# Patient Record
Sex: Male | Born: 1980
Health system: Southern US, Community
[De-identification: ages and names within clinical notes are randomized; demographics above are authoritative.]

## PROBLEM LIST (undated history)

## (undated) DIAGNOSIS — F1010 Alcohol abuse, uncomplicated: Secondary | ICD-10-CM

## (undated) DIAGNOSIS — J302 Other seasonal allergic rhinitis: Secondary | ICD-10-CM

## (undated) DIAGNOSIS — K219 Gastro-esophageal reflux disease without esophagitis: Secondary | ICD-10-CM

## (undated) DIAGNOSIS — F419 Anxiety disorder, unspecified: Secondary | ICD-10-CM

## (undated) DIAGNOSIS — F329 Major depressive disorder, single episode, unspecified: Secondary | ICD-10-CM

## (undated) DIAGNOSIS — F1320 Sedative, hypnotic or anxiolytic dependence, uncomplicated: Secondary | ICD-10-CM

## (undated) DIAGNOSIS — I10 Essential (primary) hypertension: Secondary | ICD-10-CM

## (undated) DIAGNOSIS — E785 Hyperlipidemia, unspecified: Secondary | ICD-10-CM

## (undated) DIAGNOSIS — F1120 Opioid dependence, uncomplicated: Secondary | ICD-10-CM

## (undated) DIAGNOSIS — G44209 Tension-type headache, unspecified, not intractable: Secondary | ICD-10-CM

## (undated) DIAGNOSIS — D126 Benign neoplasm of colon, unspecified: Secondary | ICD-10-CM

## (undated) DIAGNOSIS — F32A Depression, unspecified: Secondary | ICD-10-CM

## (undated) DIAGNOSIS — F1910 Other psychoactive substance abuse, uncomplicated: Secondary | ICD-10-CM

## (undated) DIAGNOSIS — S0291XA Unspecified fracture of skull, initial encounter for closed fracture: Secondary | ICD-10-CM

## (undated) DIAGNOSIS — T7840XA Allergy, unspecified, initial encounter: Secondary | ICD-10-CM

## (undated) DIAGNOSIS — K5792 Diverticulitis of intestine, part unspecified, without perforation or abscess without bleeding: Secondary | ICD-10-CM

## (undated) DIAGNOSIS — R39198 Other difficulties with micturition: Secondary | ICD-10-CM

## (undated) HISTORY — DX: Gastro-esophageal reflux disease without esophagitis: K21.9

## (undated) HISTORY — DX: Other psychoactive substance abuse, uncomplicated: F19.10

## (undated) HISTORY — PX: WISDOM TOOTH EXTRACTION: SHX21

## (undated) HISTORY — DX: Other seasonal allergic rhinitis: J30.2

## (undated) HISTORY — DX: Anxiety disorder, unspecified: F41.9

## (undated) HISTORY — DX: Hyperlipidemia, unspecified: E78.5

## (undated) HISTORY — DX: Tension-type headache, unspecified, not intractable: G44.209

## (undated) HISTORY — DX: Allergy, unspecified, initial encounter: T78.40XA

## (undated) HISTORY — DX: Major depressive disorder, single episode, unspecified: F32.9

## (undated) HISTORY — DX: Opioid dependence, uncomplicated: F11.20

## (undated) HISTORY — DX: Diverticulitis of intestine, part unspecified, without perforation or abscess without bleeding: K57.92

## (undated) HISTORY — DX: Benign neoplasm of colon, unspecified: D12.6

## (undated) HISTORY — DX: Depression, unspecified: F32.A

## (undated) HISTORY — DX: Unspecified fracture of skull, initial encounter for closed fracture: S02.91XA

## (undated) HISTORY — DX: Essential (primary) hypertension: I10

## (undated) HISTORY — DX: Sedative, hypnotic or anxiolytic dependence, uncomplicated: F13.20

## (undated) HISTORY — DX: Alcohol abuse, uncomplicated: F10.10

---

## 2002-12-04 DIAGNOSIS — S0291XA Unspecified fracture of skull, initial encounter for closed fracture: Secondary | ICD-10-CM

## 2002-12-04 HISTORY — DX: Unspecified fracture of skull, initial encounter for closed fracture: S02.91XA

## 2003-04-05 ENCOUNTER — Inpatient Hospital Stay (HOSPITAL_COMMUNITY): Admission: AC | Admit: 2003-04-05 | Discharge: 2003-04-07 | Payer: Self-pay

## 2003-04-05 ENCOUNTER — Encounter: Payer: Self-pay | Admitting: Emergency Medicine

## 2003-04-06 ENCOUNTER — Encounter: Payer: Self-pay | Admitting: Neurosurgery

## 2006-04-19 ENCOUNTER — Emergency Department (HOSPITAL_COMMUNITY): Admission: EM | Admit: 2006-04-19 | Discharge: 2006-04-19 | Payer: Self-pay | Admitting: Emergency Medicine

## 2009-12-28 ENCOUNTER — Encounter: Payer: Self-pay | Admitting: Internal Medicine

## 2010-04-03 HISTORY — PX: VASECTOMY: SHX75

## 2010-09-02 ENCOUNTER — Encounter: Payer: Self-pay | Admitting: Internal Medicine

## 2010-09-10 ENCOUNTER — Encounter: Payer: Self-pay | Admitting: Internal Medicine

## 2010-09-21 ENCOUNTER — Encounter: Payer: Self-pay | Admitting: Internal Medicine

## 2010-09-29 ENCOUNTER — Ambulatory Visit: Payer: Self-pay | Admitting: Internal Medicine

## 2010-09-29 ENCOUNTER — Encounter: Payer: Self-pay | Admitting: Internal Medicine

## 2010-09-29 ENCOUNTER — Telehealth: Payer: Self-pay | Admitting: Internal Medicine

## 2010-09-29 DIAGNOSIS — F1021 Alcohol dependence, in remission: Secondary | ICD-10-CM

## 2010-09-29 DIAGNOSIS — G44209 Tension-type headache, unspecified, not intractable: Secondary | ICD-10-CM

## 2010-09-29 DIAGNOSIS — F341 Dysthymic disorder: Secondary | ICD-10-CM | POA: Insufficient documentation

## 2010-09-30 DIAGNOSIS — M545 Low back pain: Secondary | ICD-10-CM

## 2010-10-03 ENCOUNTER — Ambulatory Visit: Payer: Self-pay | Admitting: Internal Medicine

## 2010-10-03 LAB — CONVERTED CEMR LAB
ALT: 28 units/L (ref 0–53)
AST: 20 units/L (ref 0–37)
BUN: 17 mg/dL (ref 6–23)
Chloride: 102 meq/L (ref 96–112)
Cholesterol: 165 mg/dL (ref 0–200)
Glucose, Bld: 89 mg/dL (ref 70–99)
LDL Cholesterol: 95 mg/dL (ref 0–99)
Potassium: 4.2 meq/L (ref 3.5–5.1)
Total Bilirubin: 0.5 mg/dL (ref 0.3–1.2)
Total CHOL/HDL Ratio: 5

## 2010-10-10 ENCOUNTER — Encounter: Payer: Self-pay | Admitting: Internal Medicine

## 2010-10-20 ENCOUNTER — Ambulatory Visit: Payer: Self-pay | Admitting: Internal Medicine

## 2010-10-20 ENCOUNTER — Encounter: Payer: Self-pay | Admitting: Internal Medicine

## 2010-10-21 ENCOUNTER — Telehealth (INDEPENDENT_AMBULATORY_CARE_PROVIDER_SITE_OTHER): Payer: Self-pay | Admitting: *Deleted

## 2010-10-24 ENCOUNTER — Telehealth: Payer: Self-pay | Admitting: Internal Medicine

## 2010-10-26 ENCOUNTER — Telehealth: Payer: Self-pay | Admitting: Internal Medicine

## 2010-12-04 HISTORY — PX: OTHER SURGICAL HISTORY: SHX169

## 2010-12-22 ENCOUNTER — Encounter: Payer: Self-pay | Admitting: Internal Medicine

## 2010-12-22 ENCOUNTER — Ambulatory Visit
Admission: RE | Admit: 2010-12-22 | Discharge: 2010-12-22 | Payer: Self-pay | Source: Home / Self Care | Attending: Internal Medicine | Admitting: Internal Medicine

## 2010-12-22 DIAGNOSIS — R209 Unspecified disturbances of skin sensation: Secondary | ICD-10-CM | POA: Insufficient documentation

## 2010-12-23 ENCOUNTER — Encounter: Payer: Self-pay | Admitting: Internal Medicine

## 2010-12-27 ENCOUNTER — Encounter: Payer: Self-pay | Admitting: Internal Medicine

## 2010-12-27 ENCOUNTER — Ambulatory Visit
Admission: RE | Admit: 2010-12-27 | Discharge: 2010-12-27 | Payer: Self-pay | Source: Home / Self Care | Attending: Internal Medicine | Admitting: Internal Medicine

## 2010-12-27 ENCOUNTER — Encounter (INDEPENDENT_AMBULATORY_CARE_PROVIDER_SITE_OTHER): Payer: Self-pay | Admitting: *Deleted

## 2010-12-27 ENCOUNTER — Other Ambulatory Visit: Payer: Self-pay | Admitting: Internal Medicine

## 2010-12-27 LAB — B12 AND FOLATE PANEL: Folate: >24.8 ng/mL (ref >5.9)

## 2011-01-03 NOTE — Progress Notes (Deleted)
  Phone Note Other Incoming   Request: Send information Summary of Call: Records received from [facility] (27 Pages) forwarded to Dr. Norins.       

## 2011-01-03 NOTE — Letter (Deleted)
 [facility] [address] [place], [place]  [address] Phone: [phone]      October 13, 2010   [name] [address] [place], [place] [address]  RE:  LAB RESULTS  Dear  [name],  The following is an interpretation of your most recent lab tests.  Please take note of any instructions provided or changes to medications that have resulted from your lab work.  ELECTROLYTES:  Good - no changes needed  KIDNEY FUNCTION TESTS:  Good - no changes needed  LIVER FUNCTION TESTS:  Good - no changes needed  Health professionals look at cholesterol as more involved than just the total cholesterol. We consider the level of LDL (bad) cholesterol, HDL (good), cholesterol, and Triglycerides (Grease) in the blood.  1. Your LDL should be under 100, and the HDL should be over 45, if you have any vascular disease such as heart attack, angina, stroke, TIA (mini stroke), claudication (pain in the legs when you walk due to poor circulation),  Abdominal Aortic Aneurysm (AAA), diabetes or prediabetes.  2. Your LDL should be under 130 if you have any two of the following:     a. Smoke or chew tobacco,     b. High blood pressure (if you are on medication or over 140/90 without medication),     c. Male gender,    d. HDL below 40,    e. A male relative (father, brother, or son), who have had any vascular event          as described in #1. above under the age of 55, or a male relative (mother,       sister, or daughter) who had an event as described above under age 65. (An HDL over 60 will subtract one risk factor from the total, so if you have two items in # 2 above, but an HDL over 60, you then fall into category # 3 below).  3. Your LDL should be under 160 if you have any one of the above.  Triglycerides should be under 200 with the ideal being under 150.  For diabetes or pre-diabetes, the ideal HgbA1C should be under 6.0%.  If you fall into any of the above  categories, you should make a follow up appointment to discuss this with your physician.  LIPID PANEL:  Good - no changes needed Triglyceride: 172.0   Cholesterol: 165   LDL: 95   HDL: 35.50   Chol/HDL%:  5  DIABETIC STUDIES:  Excellent - no changes needed Blood Glucose: 89    Lab results are normal.  Please come see me if you have any questions about these lab results.   Sincerely Yours,    Michael E Norins MD Patient: [name] Note: All result statuses are Final unless otherwise noted.  Tests: (1) BMP (METABOL)   Sodium                    135 mEq/L                   135-145   Potassium                 4.2 mEq/L                   3.5-5.1   Chloride                  102 mEq/L                     96-112   Carbon Dioxide            27 mEq/L                    19-32   Glucose                   89 mg/dL                    70-99   BUN                       17 mg/dL                    6-23   Creatinine                0.9 mg/dL                   0.4-1.5   Calcium                   9.4 mg/dL                   8.4-10.5   GFR                       108.82 mL/min               >60  Tests: (2) Hepatic/Liver Function Panel (HEPATIC)   Total Bilirubin           0.5 mg/dL                   0.3-1.2   Direct Bilirubin          0.0 mg/dL                   0.0-0.3   Alkaline Phosphatase      53 U/L                      39-117   AST                       20 U/L                      0-37   ALT                       28 U/L                      0-53   Total Protein             7.2 g/dL                    6.0-8.3   Albumin                   4.3 g/dL                    3.5-5.2  Tests: (3) Lipid Panel (LIPID)   Cholesterol               165 mg/dL                   0-200     ATP III Classification              Desirable:  < 200 mg/dL                    Borderline High:  200 - 239 mg/dL               High:  > = 240 mg/dL   Triglycerides        [H]  172.0 mg/dL                 0.0-149.0     Normal:   <150 mg/dL     Borderline High:  150 - 199 mg/dL   HDL                  [L]  35.50 mg/dL                 >39.00   VLDL Cholesterol          34.4 mg/dL                  0.0-40.0   LDL Cholesterol           95 mg/dL                    0-99  CHO/HDL Ratio:  CHD Risk                             5                    Men          Women     1/2 Average Risk     3.4          3.3     Average Risk          5.0          4.4     2X Average Risk          9.6          7.1     3X Average Risk          15.0          11.0 

## 2011-01-03 NOTE — Assessment & Plan Note (Deleted)
Summary: PER MD--ALMOST 1 MTH FU---STC   Vital Signs:  Patient profile:   29 year old male Height:      74 inches Weight:      168 pounds BMI:     21.65 O2 Sat:      97 % on Room air Temp:     98.7 degrees F oral Pulse rate:   71 / minute BP sitting:   110 / 68  (left arm) Cuff size:   regular  Vitals Entered By: Ami Bullins CMA (October 20, 2010 3:32 PM)  O2 Flow:  Room air CC: follow-up visit/ ab   Primary Care Provider:  Michael E Norins MD  CC:  follow-up visit/ ab.  History of Present Illness: Patient returns for follow-up. In the interval he has had a chance to review the original note without corrections or additions.  He did have ESI per Dr. HIlts which was not successful. He did not get relief from meloxicam. He did have vicodin prescribed. He continues with physical therapy usually twice a week.   He reports that his depression and anxiety are worse. He did try fluoxetine but has akesthesia. He did see Dr. Dekle 11/16 but no new medications were prescribed. Per patient Dr. Dekle did not feel there was a need for hospitalization. He does have suicidal ideation but does not have a plan or materials on hand. He has a follow-up appointment next wednsday.   Current Medications (verified): 1)  Alprazolam 1 Mg Tabs (Alprazolam) .... 1 Tablet Four Times A Day 2)  Norco 7.5-325 Mg Tabs (Hydrocodone-Acetaminophen) .... 1 By Mouth Three Times A Day Prn 3)  Meloxicam 15 Mg Tabs (Meloxicam) .... 1 By Mouth Once Daily 4)  Ranitidine Hcl 150 Mg Caps (Ranitidine Hcl) .... 1 By Mouth Two Times A Day  Allergies (verified): 1)  ! * Ssri 2)  ! * Snri's 3)  ! * Avelox PMH-FH-SH reviewed-no changes except otherwise noted  Review of Systems       The patient complains of headaches and depression.  The patient denies anorexia, fever, weight loss, hoarseness, chest pain, dyspnea on exertion, abdominal pain, and muscle weakness.    Physical Exam  General:  Thin white male in no  medical distress Head:  Normocephalic and atraumatic without obvious abnormalities. No apparent alopecia or balding. Eyes:  vision grossly intact, pupils equal, and pupils round.  C&S clear Lungs:  normal respiratory effort.   Heart:  normal rate and regular rhythm.   Neurologic:  alert & oriented X3, cranial nerves II-XII intact, and gait normal.   Psych:  dysphoric affect, depressed affect, withdrawn, poor eye contact, and slightly anxious.     Impression & Recommendations:  Problem # 1:  BACK PAIN, LUMBAR, CHRONIC (ICD-724.2) He will need to contiue with ortho and PT. He is taking norco without side effects.  His updated medication list for this problem includes:    Norco 7.5-325 Mg Tabs (Hydrocodone-acetaminophen) ..... 1 by mouth three times a day prn    Meloxicam 15 Mg Tabs (Meloxicam) ..... 1 by mouth once daily  Problem # 2:  ANXIETY DEPRESSION (ICD-300.4) Seems much worse. He does have close follow -up with psychiatry.  Complete Medication List: 1)  Alprazolam 1 Mg Tabs (Alprazolam) .... 1 tablet four times a day 2)  Norco 7.5-325 Mg Tabs (Hydrocodone-acetaminophen) .... 1 by mouth three times a day prn 3)  Meloxicam 15 Mg Tabs (Meloxicam) .... 1 by mouth once daily 4)  Ranitidine Hcl   150 Mg Caps (Ranitidine hcl) .... 1 by mouth two times a day   Orders Added: 1)  Est. Patient Level III [99213] 

## 2011-01-03 NOTE — Progress Notes (Deleted)
Summary: back pain- RF HYDROCODONE  Phone Note Call from Patient Call back at Home Phone [phone]   Caller: Spouse-Karen [phone] Summary of Call: Patient wife called lmovm stating that he is having very bad back pain. She is requesting a return call to her or him   Follow-up for Phone Call        Spoke w/pt's wife, pt missed some PT apt's but has rescheduled. He is req refill of hydrocodone, OK?  Also advised he be persistent w/therapy and come in for re-eval if pain is different or worse - Wife agreed. Follow-up by: Sarah Douglas, CMA,  October 24, 2010 12:44 PM  Additional Follow-up for Phone Call Additional follow up Details #1::        OK to refill Norco with 1 add'l Additional Follow-up by: Michael E Norins MD,  October 24, 2010 5:19 PM    Prescriptions: NORCO 7.5-325 MG TABS (HYDROCODONE-ACETAMINOPHEN) 1 by mouth three times a day prn  #90 x 1   Entered by:   Sarah Douglas, CMA   Authorized by:   Michael E Norins MD   Signed by:   Sarah Douglas, CMA on 10/24/2010   Method used:   Telephoned to ...       [facility] [address] # [id number]* (retail)       [address]       [place], [place]  [address]       Ph: [phone]       Fax: [phone]   RxID:   [id number]  

## 2011-01-03 NOTE — Progress Notes (Deleted)
Summary: Pain med?  Phone Note Call from Patient Call back at Home Phone [phone]   Summary of Call: Pt's wife called and said pt was advised to call office if he needed more pain medication? Pt would like refill of pain medication Initial call taken by: Sarah Douglas, CMA,  September 29, 2010 5:11 PM  Follow-up for Phone Call        called patinet. Will call in Rx.     

## 2011-01-03 NOTE — Assessment & Plan Note (Deleted)
Summary: NEW [employer] PT--#--PKG--STC   Vital Signs:  Patient profile:   30 year old male Height:      74 inches Weight:      171 pounds BMI:     22.03 O2 Sat:      97 % on Room air Temp:     98.2 degrees F oral Pulse rate:   74 / minute BP sitting:   122 / 72  (left arm) Cuff size:   regular  Vitals Entered By: Ami Bullins CMA (September 29, 2010 1:26 PM)  O2 Flow:  Room air CC: new pt here to est care with primary MD/ ab   Primary Care Provider:  Michael E Norins MD  CC:  new pt here to est care with primary MD/ ab.  History of Present Illness: Present presents to establish for on-going primary care.  Has problem with low back pain: sponylolisthesis L5-S1, bulging disk at L5-S1. Studies ordered Dr. Dean/Hilts. He was last seen last week. For steroid injection today by Dr. Hilts. He is currently doing PT with [facility]. Has not responded to muscle relaxants. Currently on Hydorcodone/APAP 7.5/325. Has failed NSAIDs and has had dyspepsia.   He has a pilonidal cyst - which has been intermittently treated. He has not seen a surgeon.  Sees Dr. Diekel for anxiety management.   Preventive Screening-Counseling & Management  Alcohol-Tobacco     Alcohol drinks/day: 0     Smoking Status: quit     Year Quit: 2009  Caffeine-Diet-Exercise     Caffeine use/day: 3 cups per day     Diet Comments: healthy diet kind of     Does Patient Exercise: no  Hep-HIV-STD-Contraception     Hepatitis Risk: no risk noted     HIV Risk: no risk noted     STD Risk: no risk noted     Dental Visit-last 6 months yes     Sun Exposure-Excessive: no  Safety-Violence-Falls     Seat Belt Use: yes     Helmet Use: yes     Firearms in the Home: no firearms in the home     Smoke Detectors: yes     Violence in the Home: no risk noted     Sexual Abuse: no     Fall Risk: low fall risk      Sexual History:  currently monogamous.        Drug Use:  former.        Blood Transfusions:  no.    Current  Medications (verified): 1)  Alprazolam 1 Mg Tabs (Alprazolam) .... 1 Tablet Four Times A Day 2)  Norco 7.5-325 Mg Tabs (Hydrocodone-Acetaminophen) .... 1 By Mouth Three Times A Day Prn 3)  Meloxicam 15 Mg Tabs (Meloxicam) .... 1 By Mouth Once Daily 4)  Ranitidine Hcl 150 Mg Caps (Ranitidine Hcl) .... 1 By Mouth Two Times A Day  Allergies (verified): 1)  ! * Ssri 2)  ! * Snri's 3)  ! * Avelox  Past History:  Past Medical History: HEADACHE, TENSION (ICD-307.81) ANXIETY DEPRESSION (ICD-300.4) ALCOHOL ABUSE, HX OF (ICD-V11.3)  Past Surgical History: Vasectomy May '11 Trauma - skull fracture '04  Family History: Father- 1955 - HTN, Lipids Mother -deceased 55 - EtOH/Drug related disease, depression/anxiety Neg- colon or prostate cancer; CAD; DM Positive for depression/anxiety; alcohol abuse.  Social History: [employer], attending [employer] ( oct o'11) Married - June '11 No children work - full time student No history of physical or sexual abuseSmoking Status:    quit Caffeine use/day:  3 cups per day Sun Exposure-Excessive:  no Seat Belt Use:  yes Fall Risk:  low fall risk Does Patient Exercise:  no Hepatitis Risk:  no risk noted HIV Risk:  no risk noted STD Risk:  no risk noted Dental Care w/in 6 mos.:  yes Sexual History:  currently monogamous Drug Use:  former Blood Transfusions:  no  Review of Systems       The patient complains of anorexia, abdominal pain, severe indigestion/heartburn, and difficulty walking.  The patient denies fever, weight loss, weight gain, vision loss, decreased hearing, hoarseness, chest pain, syncope, dyspnea on exertion, prolonged cough, hemoptysis, melena, hematochezia, hematuria, muscle weakness, transient blindness, depression, enlarged lymph nodes, angioedema, and testicular masses.         NSAIDs, hemetemesis - mild. Due to back pain - leg weakness  Physical Exam  General:  Slender white male in no distress Head:  normocephalic and atraumatic.     Eyes:  vision grossly intact, pupils equal, pupils round, and corneas and lenses clear.   Ears:  External ear exam shows no significant lesions or deformities.  Otoscopic examination reveals clear canals, tympanic membranes are intact bilaterally without bulging, retraction, inflammation or discharge. Hearing is grossly normal bilaterally. Nose:  no external deformity and no external erythema.   Mouth:  Oral mucosa and oropharynx without lesions or exudates.  Teeth in good repair. Neck:  supple, full ROM, no thyromegaly, and no carotid bruits.   Chest Wall:  no deformities.   Lungs:  normal respiratory effort, normal breath sounds, and no wheezes.   Heart:  normal rate, regular rhythm, no murmur, and no JVD.   Abdomen:  soft, non-tender, and normal bowel sounds.   Msk:  normal ROM and no joint tenderness.  Back - able to stand without assist, able to step-up to exam table without assist, nl SLR sitting Pulses:  2+ radial Extremities:  No clubbing, cyanosis, edema, or deformity noted with normal full range of motion of all joints.   Neurologic:  alert & oriented X3, cranial nerves II-XII intact, gait normal, and DTRs symmetrical and normal.   Skin:  turgor normal, color normal, and no rashes.   Cervical Nodes:  no anterior cervical adenopathy and no posterior cervical adenopathy.   Psych:  Oriented X3, normally interactive, good eye contact, and not anxious appearing.     Impression & Recommendations:  Problem # 1:  BACK PAIN, LUMBAR, CHRONIC (ICD-724.2) Per patient report spondylolisthesis L5-S1 with associated disk bulge. He has been followed by Dr. Dean and Dr. Hilts. He has been prescribed hydorcodone for pain. He is participating in PT. He is scheduled for ESI today.  Discussed the use of controlled substances. Reviewed our policy: single prescriber, single pharmacy, no after hours or weekend calls for refills, random drug screens at doctor's descretion.  Patient advised that PT and  use of NSAIDs are best option. He has had a problem with non-selective NSAIDs in the past with stomach irritation. Suggested that COX 2 selective agents along with H2 blocker reduction of gastric acidity may be a solution.  His updated medication list for this problem includes:    Norco 7.5-325 Mg Tabs (Hydrocodone-acetaminophen) ..... 1 by mouth three times a day prn    Meloxicam 15 Mg Tabs (Meloxicam) ..... 1 by mouth once daily  Addendum - patient called this PM with request for medication. 30 Day supply of hydorcodone called in along with meloxicam and ranitidine.  Problem # 2:    HEADACHE, TENSION (ICD-307.81) Intermittent problem which he associates with his back pain.  His updated medication list for this problem includes:    Norco 7.5-325 Mg Tabs (Hydrocodone-acetaminophen) ..... 1 by mouth three times a day prn    Meloxicam 15 Mg Tabs (Meloxicam) ..... 1 by mouth once daily  Problem # 3:  ANXIETY DEPRESSION (ICD-300.4) Chronic problem along with a positive family history. He is under the care of a psychiatrist.  Problem # 4:  ALCOHOL ABUSE, HX OF (ICD-V11.3) clean and sober for 2 years. Discussed long term management and the high risk of recidivism. Stongly supported AA should he have any issues with maintaining his recovery.  Problem # 5:  Preventive Health Care (ICD-V70.0) Limited physical exam is unremarkable. He is to return for routine lab: glucose, lipids, renal function and liver functions. He will need a follow-up visit in 4 weeks.   Complete Medication List: 1)  Alprazolam 1 Mg Tabs (Alprazolam) .... 1 tablet four times a day 2)  Norco 7.5-325 Mg Tabs (Hydrocodone-acetaminophen) .... 1 by mouth three times a day prn 3)  Meloxicam 15 Mg Tabs (Meloxicam) .... 1 by mouth once daily 4)  Ranitidine Hcl 150 Mg Caps (Ranitidine hcl) .... 1 by mouth two times a day Prescriptions: RANITIDINE HCL 150 MG CAPS (RANITIDINE HCL) 1 by mouth two times a day  #60 x 12   Entered and  Authorized by:   Michael E Norins MD   Signed by:   Michael E Norins MD on 09/29/2010   Method used:   Telephoned to ...       [facility] # [id number]* (retail)       [address]       [place], [place]  [address]       Ph: [phone]       Fax: [phone]   RxID:   [id number] MELOXICAM 15 MG TABS (MELOXICAM) 1 by mouth once daily  #30 x 12   Entered and Authorized by:   Michael E Norins MD   Signed by:   Michael E Norins MD on 09/29/2010   Method used:   Telephoned to ...         RxID:   [id number] NORCO 7.5-325 MG TABS (HYDROCODONE-ACETAMINOPHEN) 1 by mouth three times a day prn  #90 x 0   Entered and Authorized by:   Michael E Norins MD   Signed by:   Michael E Norins MD on 09/29/2010   Method used:   Telephoned to ...         RxID:   [id number]    Orders Added: 1)  New Patient Level IV [99204]   Immunization History:  Tetanus/Td Immunization History:    Tetanus/Td:  historical (02/05/2003)   Immunization History:  Tetanus/Td Immunization History:    Tetanus/Td:  Historical (02/05/2003)  

## 2011-01-03 NOTE — Letter (Deleted)
Summary: [facility]  [facility]   Imported By: Iris Hopkins 11/11/2010 12:20:58  _____________________________________________________________________  External Attachment:    Type:   Image     Comment:   External Document  

## 2011-01-03 NOTE — Progress Notes (Deleted)
Summary: Hydrocodone  Phone Note Call from Patient Call back at Home Phone [phone]   Summary of Call: Pt states that at last office visit MD advised pt to take hydrocodone four times a day. Patient is requesting rx to reflect this.  Initial call taken by: Sarah Douglas, CMA,  October 26, 2010 3:54 PM  Follow-up for Phone Call        Hmmmmm! My note doesn't reflect that but I may have said he could take it every 6 hours. Change med list to reflect q6 dosing = #120, 2 refills. Thanks Follow-up by: Mcguire Gasparyan E Dicy Smigel MD,  October 26, 2010 5:22 PM  Additional Follow-up for Phone Call Additional follow up Details #1::        pt & pharm informed. Canceled previous rx's refill of qty # and called in new Additional Follow-up by: Sarah Douglas, CMA,  October 26, 2010 6:05 PM    New/Updated Medications: NORCO 7.5-325 MG TABS (HYDROCODONE-ACETAMINOPHEN) 1 four times a day as needed pain Prescriptions: NORCO 7.5-325 MG TABS (HYDROCODONE-ACETAMINOPHEN) 1 four times a day as needed pain  #120 x 2   Entered by:   Sarah Douglas, CMA   Authorized by:   Massie Cogliano E Niko Penson MD   Signed by:   Sarah Douglas, CMA on 10/26/2010   Method used:   Telephoned to ...       [facility] # [id number]* (retail)       [address]       [place],   [address]       Ph: [phone]       Fax: [phone]   RxID:   [id number]  

## 2011-01-05 NOTE — Miscellaneous (Deleted)
Summary: Orders Update  Clinical Lists Changes  Orders: Added new Referral order of Pain Clinic Referral (Pain) - Signed 

## 2011-01-05 NOTE — Assessment & Plan Note (Deleted)
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  He also is asking if it would be oK with me for him to see a NP working with Claudia Desanctis, MD to establish for local psychiatric care - which of course if fine.   Current Medications (verified): 1)  Alprazolam 1 Mg Tabs (Alprazolam) .Marland Kitchen.. 1 Tablet Four Times A Day 2)  Norco 7.5-325 Mg Tabs (Hydrocodone-Acetaminophen) .Marland Kitchen.. 1 Four Times A Day As Needed Pain 3)  Meloxicam 15 Mg Tabs (Meloxicam) .Marland Kitchen.. 1 By Mouth Once Daily 4)  Ranitidine Hcl 150 Mg Caps (Ranitidine Hcl) .Marland Kitchen.. 1 By Mouth Two Times A Day  Allergies (verified): 1)  ! * Ssri 2)  ! * Snri's 3)  ! * Avelox PMH-FH-SH reviewed-no changes except otherwise noted   Complete Medication List: 1)  Alprazolam 1 Mg Tabs (Alprazolam) .Marland Kitchen.. 1 tablet four times a day 2)  Norco 7.5-325 Mg Tabs (Hydrocodone-acetaminophen) .Marland Kitchen.. 1 four times a day as needed pain 3)  Meloxicam 15 Mg Tabs (Meloxicam) .Marland Kitchen.. 1 by mouth once daily 4)  Ranitidine Hcl 150 Mg Caps (Ranitidine hcl) .Marland Kitchen.. 1 by mouth two times a day   Orders Added: 1)  Est. Patient Level I [74259]

## 2011-01-05 NOTE — Letter (Deleted)
Summary: Generic Letter  [facility]  [address] [place], [place] [address]   Phone: [phone]  Fax: [phone]          12/27/2010  Re: [name]     DOB Sep 25, 1981     MRN # [id number]  To Whom It May Concern:  [name] is follow in out practice for chronic back pain, chronic headache and anxiety/depression that has been managed by psychiatry.   Over the past several months his condidtion has deteriorated with increased problems with back pain and headache such that he cannot sit for prolonged periods of time and has increased difficulty with concentration due to the pain, pain meds and headache.  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 His medical problems have significantly interfered with his ability to continue his studies.   Thank you for your consideration.   Sincerely,   Illene Regulus MD

## 2011-01-05 NOTE — Assessment & Plan Note (Deleted)
Summary: discuss ortho/lb   Vital Signs:  Patient profile:   30 year old male Height:      74 inches Weight:      171 pounds BMI:     22.03 O2 Sat:      98 % on Room air Temp:     98.6 degrees F oral Pulse rate:   77 / minute BP sitting:   116 / 82  (left arm) Cuff size:   regular  Vitals Entered By: [name] CMA (December 22, 2010 11:12 AM)  O2 Flow:  Room air  Primary Care Provider:  Jacques Navy MD   History of Present Illness: [name] presents today for progressive pain in the back and now with loss of feeling left heel, lateral aspect over the past month. He does have pain that radiates down to the knee. This numbness does not interfere with walking or balance.   He also reports that he looses grip strength either in one or both hands. Usually of limited duration and normal strength returns. This has been a problem for many years.   Current Medications (verified): 1)  Alprazolam 1 Mg Tabs (Alprazolam) .Marland Kitchen.. 1 Tablet Four Times A Day 2)  Norco 7.5-325 Mg Tabs (Hydrocodone-Acetaminophen) .Marland Kitchen.. 1 Four Times A Day As Needed Pain 3)  Meloxicam 15 Mg Tabs (Meloxicam) .Marland Kitchen.. 1 By Mouth Once Daily 4)  Ranitidine Hcl 150 Mg Caps (Ranitidine Hcl) .Marland Kitchen.. 1 By Mouth Two Times A Day  Allergies (verified): 1)  ! * Ssri 2)  ! * Snri's 3)  ! * Avelox  Past History:  Past Medical History: Last updated: 09/29/2010 HEADACHE, TENSION (ICD-307.81) ANXIETY DEPRESSION (ICD-300.4) ALCOHOL ABUSE, HX OF (ICD-V11.3)  Past Surgical History: Last updated: 09/29/2010 Vasectomy May '11 Trauma - skull fracture '04  Family History: Last updated: 09/29/2010 Father- 1955 - HTN, Lipids Mother -deceased 5 - EtOH/Drug related disease, depression/anxiety Neg- colon or prostate cancer; CAD; DM Positive for depression/anxiety; alcohol abuse.  Social History: Last updated: 09/29/2010 HS[employer], attending [employer] ( oct o'11) Married - June '11 No children work - full Neurosurgeon No history of  physical or sexual abuse  Review of Systems  The patient denies anorexia, weight loss, weight gain, chest pain, dyspnea on exertion, headaches, abdominal pain, hematochezia, hematuria, muscle weakness, suspicious skin lesions, depression, abnormal bleeding, and angioedema.    Physical Exam  General:  slender and tall white male in no distress Head:  normocephalic and atraumatic.   Eyes:  C&S clear Lungs:  normal respiratory effort.   Heart:  normal rate and regular rhythm.   Msk:  back exam: nl flex to 180 but difficulty standing back up straight. Nl gait, toe and heel walk altough he complained of discomfort iwth toe walk. Able to step up to exam. DTRs 2+ at patellar tendon. Nl SLR sitting. Sensation normal to light touch and pin prick left LE except for lateral aspect of heel.  Nl deep vibratory sensation. Pulses:  2+ radial Neurologic:  alert & oriented X3.   Skin:  turgor normal and color normal.   Psych:  Oriented X3, normally interactive, good eye contact, and not anxious appearing.  Appears to have a flat affect.   Impression & Recommendations:  Problem # 1:  PARESTHESIA (ICD-782.0) Pateint with paresthesia left heel. Exam with no radicular findings. Concern for metabolic cause of symptoms.  Plan - r/o  B12 or thyroid deficiency.  Orders: TLB-B12 + Folate Pnl ([id number]-U98/JXB) TLB-TSH (Thyroid Stimulating Hormone) (84443-TSH)  Problem # 2:  BACK PAIN, LUMBAR, CHRONIC (ICD-724.2) Patient asking about stronger pain medication. Advised that alternatives to narcotic pain medication is worth pursuing.  Plan - no change in medication           referral for pain management   His updated medication list for this problem includes:    Norco 7.5-325 Mg Tabs (Hydrocodone-acetaminophen) .Marland Kitchen... 1 four times a day as needed pain    Meloxicam 15 Mg Tabs (Meloxicam) .Marland Kitchen... 1 by mouth once daily  Problem # 3:  ANXIETY DEPRESSION (ICD-300.4) Dr. Duayne Cal is changing practice and [name] wishes at this juncture to move his care to [facility].  He is provided names and tele #'s for Drs. Nolen Mu, Plovsky and Cucumber.  Complete Medication List: 1)  Alprazolam 1 Mg Tabs (Alprazolam) .Marland Kitchen.. 1 tablet four times a day 2)  Norco 7.5-325 Mg Tabs (Hydrocodone-acetaminophen) .Marland Kitchen.. 1 four times a day as needed pain 3)  Meloxicam 15 Mg Tabs (Meloxicam) .Marland Kitchen.. 1 by mouth once daily 4)  Ranitidine Hcl 150 Mg Caps (Ranitidine hcl) .Marland Kitchen.. 1 by mouth two times a day   Orders Added: 1)  TLB-B12 + Folate Pnl [[id number]/FOL] 2)  TLB-TSH (Thyroid Stimulating Hormone) [84443-TSH] 3)  Est. Patient Level III [65784]

## 2011-01-05 NOTE — Letter (Deleted)
Summary: Primary Care Consult Scheduled Letter  [facility]  [address] [place], [place] [address]   Phone: [phone]  Fax: [phone]      12/27/2010 MRN: [id number]  [facility] [address] [place], [place]  [address]    Dear Mr. Laminack,      We have scheduled an appointment for you.  At the recommendation of Dr.Norins we have scheduled you a consult with [facility] (Dr. Wynn Banker) on 02-06-2011 at 10:45 am . Their address is [address], [address], [place], [place] [address]. The office phone number is [phone].  If this appointment day and time is not convenient for you, please feel free to call the office of the doctor you are being referred to at the number listed above and reschedule the appointment.     It is important for you to keep your scheduled appointments. We are here to make sure you are given good patient care. If you have questions or you have made changes to your appointment, please notify us at  [phone]        , ask for   Debra           .     Thank you,  Patient Care Coordinator [facility]

## 2011-01-05 NOTE — Letter (Deleted)
Summary: Date Range: 08-24-10 to 09-21-10/[facility]  Date Range: 08-24-10 to 09-21-10/[facility]   Imported By: Dorothy Jones 12/15/2010 14:48:31  _____________________________________________________________________  External Attachment:    Type:   Image     Comment:   External Document  

## 2011-01-19 ENCOUNTER — Encounter: Payer: Self-pay | Admitting: Internal Medicine

## 2011-01-19 ENCOUNTER — Ambulatory Visit: Payer: Self-pay | Admitting: Internal Medicine

## 2011-01-19 ENCOUNTER — Ambulatory Visit (INDEPENDENT_AMBULATORY_CARE_PROVIDER_SITE_OTHER): Payer: BC Managed Care – PPO | Admitting: Internal Medicine

## 2011-01-19 DIAGNOSIS — F341 Dysthymic disorder: Secondary | ICD-10-CM

## 2011-01-19 DIAGNOSIS — M545 Low back pain: Secondary | ICD-10-CM

## 2011-01-23 ENCOUNTER — Telehealth: Payer: Self-pay | Admitting: Internal Medicine

## 2011-01-23 DIAGNOSIS — L0591 Pilonidal cyst without abscess: Secondary | ICD-10-CM | POA: Insufficient documentation

## 2011-01-25 NOTE — Assessment & Plan Note (Deleted)
Summary: follow up, discuss recent labs   Vital Signs:  Patient profile:   30 year old male Height:      74 inches Weight:      172 pounds BMI:     22.16 O2 Sat:      97 % on Room air Temp:     97.9 degrees F oral Pulse rate:   88 / minute Resp:     16 per minute BP sitting:   116 / 62  (left arm)  Vitals Entered By: Burnard Leigh CMA(AAMA) (January 19, 2011 11:38 AM)  O2 Flow:  Room air CC: Pt here to discuss lab results & MRI.Marland KitchenPt also c/o of pain in back to be assessed/sls,cma Is Patient Diabetic? No   Primary Care Provider:  Jacques Navy MD  CC:  Pt here to discuss lab results & MRI.Marland KitchenPt also c/o of pain in back to be assessed/sls and cma.  History of Present Illness: [name] has seen Johnathan Poles, NP with [name]. He did not do well with lamictal and then the office would not refill his xanax. He does not wish to return to them. He is planning to return  to  [name] old office. He will need refill of alprazolam. If he cannot get an appoiintment I will provide interim prescriptions but I made it clear that psych should be managing his medications.  He has stopped Physical therapy. He has been referred by his ortho to a chiropractor. He does have an appt with [name] March 5th - encouraged to keep this appointment.   Current Medications (verified): 1)  Alprazolam 1 Mg Tabs (Alprazolam) .Marland Kitchen.. 1 Tablet Four Times A Day 2)  Norco 7.5-325 Mg Tabs (Hydrocodone-Acetaminophen) .Marland Kitchen.. 1 Four Times A Day As Needed Pain 3)  Meloxicam 15 Mg Tabs (Meloxicam) .Marland Kitchen.. 1 By Mouth Once Daily  Allergies (verified): 1)  ! * Ssri 2)  ! * Snri's 3)  ! * Avelox  Past History:  Past Medical History: Last updated: 09/29/2010 HEADACHE, TENSION (ICD-307.81) ANXIETY DEPRESSION (ICD-300.4) ALCOHOL ABUSE, HX OF (ICD-V11.3)  Past Surgical History: Last updated: 09/29/2010 Vasectomy May '11 Trauma - skull fracture '04 FH reviewed for relevance, SH/Risk Factors reviewed  for relevance  Review of Systems  The patient denies anorexia, weight loss, weight gain, syncope, headaches, abdominal pain, muscle weakness, difficulty walking, and enlarged lymph nodes.    Physical Exam  General:  slender white man in no distress Head:  normocephalic and atraumatic.   Eyes:  C&S clear Neck:  supple.   Lungs:  normal respiratory effort.   Heart:  normal rate and regular rhythm.   Msk:  normal ROM.   Neurologic:  alert & oriented X3 and gait normal.   Skin:  turgor normal and color normal.   Psych:  Oriented X3, memory intact for recent and remote, normally interactive, and good eye contact.     Impression & Recommendations:  Problem # 1:  BACK PAIN, LUMBAR, CHRONIC (ICD-724.2) He will keep his appointment with [name] at [facility]. OK to also see chiropractor should he so choose. Encouraged him to use norco sparingly - no refill today  His updated medication list for this problem includes:    Norco 7.5-325 Mg Tabs (Hydrocodone-acetaminophen) .Marland Kitchen... 1 four times a day as needed pain    Meloxicam 15 Mg Tabs (Meloxicam) .Marland Kitchen... 1 by mouth once daily  Problem # 2:  ANXIETY DEPRESSION (ICD-300.4) Appears to be stable at today's visit. Plan is  outline in HPI.  Complete Medication List: 1)  Alprazolam 1 Mg Tabs (Alprazolam) .Marland Kitchen.. 1 tablet four times a day 2)  Norco 7.5-325 Mg Tabs (Hydrocodone-acetaminophen) .Marland Kitchen.. 1 four times a day as needed pain 3)  Meloxicam 15 Mg Tabs (Meloxicam) .Marland Kitchen.. 1 by mouth once daily   Orders Added: 1)  Est. Patient Level III [16109]

## 2011-01-31 ENCOUNTER — Encounter: Payer: Self-pay | Admitting: Internal Medicine

## 2011-01-31 NOTE — Progress Notes (Deleted)
Summary: RF  Phone Note Call from Patient   Caller: Wife Stark Klein [phone]  Summary of Call: Patient is requesting refill of alprazolam 1mg  1 four times a day #150. Previously written by Dr Duayne Cal. He is req rx asap - wife says pt is having panic attack. Initial call taken by: Lamar Sprinkles, CMA,  January 23, 2011 2:04 PM  Follow-up for Phone Call        OK one per MD - Pt informed - He has apt w/Dr [facility] in april.   ALSO - wife left vm, pt's cyst is acting up? Wants referral to surgeon.  Follow-up by: Lamar Sprinkles, CMA,  January 23, 2011 2:08 PM  Additional Follow-up for Phone Call Additional follow up Details #1::        OK for referral for pilonidal cyst. [facility] notified Additional Follow-up by: Jacques Navy MD,  January 23, 2011 5:33 PM  New Problems: PILONIDAL CYST (ICD-685.1)   New Problems: PILONIDAL CYST (ICD-685.1) Prescriptions: ALPRAZOLAM 1 MG TABS (ALPRAZOLAM) 1 tablet four times a day  #150 x 0   Entered by:   Lamar Sprinkles, CMA   Authorized by:   Jacques Navy MD   Signed by:   Lamar Sprinkles, CMA on 01/23/2011   Method used:   Telephoned to ...       [facility] # [phone]* (retail)       [address]       [place], [place]  [address]       Ph: [phone]       Fax: [phone]   RxID:   [id number]

## 2011-02-06 ENCOUNTER — Encounter: Payer: BC Managed Care – PPO | Attending: Physical Medicine & Rehabilitation

## 2011-02-06 ENCOUNTER — Ambulatory Visit (HOSPITAL_BASED_OUTPATIENT_CLINIC_OR_DEPARTMENT_OTHER): Payer: BC Managed Care – PPO | Admitting: Physical Medicine & Rehabilitation

## 2011-02-06 DIAGNOSIS — M79609 Pain in unspecified limb: Secondary | ICD-10-CM

## 2011-02-06 DIAGNOSIS — M545 Low back pain, unspecified: Secondary | ICD-10-CM | POA: Insufficient documentation

## 2011-02-06 DIAGNOSIS — R209 Unspecified disturbances of skin sensation: Secondary | ICD-10-CM

## 2011-02-06 DIAGNOSIS — Q762 Congenital spondylolisthesis: Secondary | ICD-10-CM | POA: Insufficient documentation

## 2011-02-09 ENCOUNTER — Telehealth: Payer: Self-pay | Admitting: Internal Medicine

## 2011-02-10 ENCOUNTER — Ambulatory Visit (HOSPITAL_COMMUNITY)
Admission: RE | Admit: 2011-02-10 | Discharge: 2011-02-10 | Disposition: A | Discharge status: Home / Self Care | Payer: BC Managed Care – PPO | Source: Ambulatory Visit | Attending: Physical Medicine & Rehabilitation | Admitting: Physical Medicine & Rehabilitation

## 2011-02-10 ENCOUNTER — Other Ambulatory Visit: Payer: Self-pay | Admitting: Physical Medicine & Rehabilitation

## 2011-02-10 DIAGNOSIS — M549 Dorsalgia, unspecified: Secondary | ICD-10-CM | POA: Insufficient documentation

## 2011-02-10 DIAGNOSIS — R52 Pain, unspecified: Secondary | ICD-10-CM

## 2011-02-10 DIAGNOSIS — Q762 Congenital spondylolisthesis: Secondary | ICD-10-CM | POA: Insufficient documentation

## 2011-02-14 NOTE — Progress Notes (Signed)
Summary: RF  Phone Note Refill Request Message from:  Pharmacy  Refills Requested: Medication #1:  NORCO 7.5-325 MG TABS 1 four times a day as needed pain [facility]  Initial call taken by: Lamar Sprinkles, CMA,  February 09, 2011 3:55 PM  Follow-up for Phone Call        ok for refil x 2 Follow-up by: Jacques Navy MD,  February 09, 2011 6:26 PM    Prescriptions: NORCO 7.5-325 MG TABS (HYDROCODONE-ACETAMINOPHEN) 1 four times a day as needed pain  #120 x 2   Entered by:   Bill Salinas CMA   Authorized by:   Jacques Navy MD   Signed by:   Bill Salinas CMA on 02/10/2011   Method used:   Telephoned to ...       [facility] # [phone]* (retail)       [address]       [place], [place]  [address]       Ph: [phone]       Fax: [phone]   RxID:   [id number]

## 2011-02-14 NOTE — Consult Note (Signed)
Summary: [facility]   Imported By: Sherian Rein 02/10/2011 13:59:00  _____________________________________________________________________  External Attachment:    Type:   Image     Comment:   External Document

## 2011-02-23 ENCOUNTER — Ambulatory Visit (HOSPITAL_BASED_OUTPATIENT_CLINIC_OR_DEPARTMENT_OTHER): Payer: BC Managed Care – PPO | Admitting: Physical Medicine & Rehabilitation

## 2011-02-23 ENCOUNTER — Ambulatory Visit (INDEPENDENT_AMBULATORY_CARE_PROVIDER_SITE_OTHER): Payer: BC Managed Care – PPO | Admitting: Internal Medicine

## 2011-02-23 VITALS — BP 120/84 | HR 87 | Temp 98.6°F | Ht 74.0 in | Wt 174.0 lb

## 2011-02-23 DIAGNOSIS — J90 Pleural effusion, not elsewhere classified: Secondary | ICD-10-CM

## 2011-02-23 DIAGNOSIS — Q762 Congenital spondylolisthesis: Secondary | ICD-10-CM

## 2011-02-23 MED ORDER — FLUTICASONE PROPIONATE 50 MCG/ACT NA SUSP: 2.0000 | Freq: Every day | NASAL | Status: DC

## 2011-02-23 MED ORDER — FUROSEMIDE 20 MG PO TABS: 20.0000 mg | ORAL_TABLET | Freq: Every day | ORAL | Status: DC

## 2011-02-23 MED ORDER — ALPRAZOLAM 1 MG PO TABS: 1.0000 mg | ORAL_TABLET | Freq: Four times a day (QID) | ORAL | Status: DC | PRN

## 2011-02-23 NOTE — Patient Instructions (Signed)
1. Respiratory infection - x-ray does not show any signs of infection. Will check CBC today. Please finish the avalox 2. Pulmonary effusion - concern for possible heart failure, not unexpected for age and other vascular disease.     Plan - start furosemide 20mg  AM and PM for 3 days, then just once a day.                Lab today: checking chemistries and a test for heart failure                2 D echocardiogram at [facility] to assess cardiac function                Rule of "2's" - weigh every day at the same time in the same state of dress: if you can 2+ pounds in 2 days double the furosemide for two days.                Return office visit in 1 week.

## 2011-02-24 NOTE — Progress Notes (Signed)
  Subjective:    Patient ID: [name], male    DOB: 19-Jun-1981, 30 y.o.   MRN: [id number]  HPI Patient presents requesting refill of xanax. This request was originally denied due to up-coming psychiatric appt, but the appointment is 40 days away. He is also requesting nasal steroid spray for allergy.    Review of Systems     Objective:   Physical Exam        Assessment & Plan:  Refill xanax provided for 30 days with 1 refill.  Rx for flonase provided.

## 2011-03-09 ENCOUNTER — Ambulatory Visit (INDEPENDENT_AMBULATORY_CARE_PROVIDER_SITE_OTHER): Payer: BC Managed Care – PPO | Admitting: Internal Medicine

## 2011-03-09 VITALS — BP 124/90 | HR 112 | Temp 98.4°F | Wt 171.0 lb

## 2011-03-09 DIAGNOSIS — M545 Low back pain, unspecified: Secondary | ICD-10-CM

## 2011-03-09 DIAGNOSIS — F341 Dysthymic disorder: Secondary | ICD-10-CM

## 2011-03-09 NOTE — Progress Notes (Signed)
Subjective:    Patient ID: [name], male    DOB: 1981-08-11, 30 y.o.   MRN: [id number]  HPI [name] presents for evaluation of on-going chronic back pain. Reviewed outside records: he has seen Dr. Prince Rome, Dr. August Saucer, Dr. Rochele Pages, Dr. Liliana Cline ([facility]) all of whom found no radicular findings. He has failed ESI and declined further injection therapy. He has tried and given up on PT - due to pain. He has been to [facility] and declines re-referral. He is intolerant of all SSRI/NE drugs so is not willing to try adjunctive therapy. He has failed and was intolerant of lyrica. He has seen Dr.Kirsten at pain management and declines to return. Dr. Doroteo Bradford did not find any radicular symptoms.  [name] reports terrible back pain. He feels he has joint laxity disease but does not fit the diagnosis of Marfan's. He reports that his pain totally limits his activity and that he actually cries in pain every day. He is awaiting an appointment with Dr. Fannie Knee at [facility] for another surgical opinion.  He continues to manifest marked psychiatric issues with anxiety, depression and other. He continue on Xanax and doe not want any additional prescriptions at this time. Of note he does have an appointment with Dr. Donell Beers on Monday April 9th.   PMH, FamHx and SocHx reviewed for any changes and relevance.   Review of Systems  Constitutional: Positive for activity change and appetite change. Negative for fever, chills and diaphoresis.       Very thin white male who is very uncomfortable and distress by his back pain.  Eyes: Negative.   Respiratory: Negative.  Negative for cough, choking, chest tightness, shortness of breath and wheezing.   Cardiovascular: Negative.  Negative for chest pain, palpitations and leg swelling.  Gastrointestinal: Negative.   Genitourinary: Negative.   Musculoskeletal: Positive for back pain and gait problem. Negative for myalgias and joint swelling.    Neurological: Positive for dizziness, weakness, numbness and headaches. Negative for syncope, speech difficulty and light-headedness.       Numbness of the left heel  Hematological: Negative.   Psychiatric/Behavioral: Positive for dysphoric mood, decreased concentration and agitation.       Objective:   Physical Exam  Nursing note and vitals reviewed. Constitutional: He is oriented to person, place, and time. He appears distressed.       Very thin white male who is uncomfortable  HENT:  Head: Normocephalic and atraumatic.  Eyes: Conjunctivae and EOM are normal. Pupils are equal, round, and reactive to light.  Neck: Normal range of motion. Neck supple. No JVD present. No tracheal deviation present. No thyromegaly present.  Cardiovascular: Normal rate, regular rhythm and normal heart sounds.   Pulmonary/Chest: Effort normal and breath sounds normal. He has no wheezes. He has no rales.  Abdominal: Soft. Bowel sounds are normal. There is no tenderness. There is no rebound.  Musculoskeletal:       Thoracic back: He exhibits tenderness and pain. He exhibits normal range of motion, no bony tenderness, no swelling, no edema and no deformity.       No CVAT tenderness or deformity. Able to lay supine without difficulty. Able to rise up directly with some pain, no pain if he rolls to the side and pushes up.  Nl gait, nl toe/heel walk, nl step-up to exam, nl patellar tendon reflexes, nl SLR sitting and supine, nl sensation to light touch, pin prick and deep vibratory sensation.  Lymphadenopathy:    He  has no cervical adenopathy.  Neurological: He is alert and oriented to person, place, and time. He has normal reflexes. No cranial nerve deficit. Coordination normal.  Skin: Skin is warm and dry. No rash noted. He is not diaphoretic.  Psychiatric: His mood appears anxious. His affect is angry and labile. His speech is rapid and/or pressured. He is agitated. He exhibits a depressed mood.        Frustrated that there is not a more specific diagnosis. Frustrated about the degree of pain that he has - angry about this.          Assessment & Plan:  1. BAck pain - no radicular findings on exam by this examine, pain specialist, others. MRI does not reveal significatn abnormality, i.e. Nerve compression. He is not willing to return to pain management, regular PT, integrative therapies. His drug intolerances limits these options.  Plan - advised to keep appointment when made with consultant at [facility] but I cautioned about getting his hopes up given the dearth of hard physical findings.           Strongly advised that he consider re-visiting [facility].  2. Anxiety/depression - he appears more agitated and depressed. He does not seem impulsive nor does he appear to be a threat to himself.  Plan - keep new patient appointment with Dr. Donell Beers

## 2011-03-15 NOTE — Consult Note (Addendum)
REASON FOR CONSULTATION:  Back pain.  CHIEF COMPLAINT:  Spondylolisthesis.  HISTORY:  A 30 year old Rose who gives a 1-year history of gradual increasing back pain and lower extremity pain.  He has seen his primary care physician, Dr. Debby Bud and also has been seen at [facility] by Dr. August Saucer from Orthopedic Surgery, Dr. Casimiro Needle from Sports Medicine, and Dr. Alvester Morin from Physical Medicine and Rehabilitation.  He reports having had MRI of the lumbar spine, has had some x-rays of his cervical spine as well.  He feels like his pain and his problems getting worse with time.  I looked through E-chart, no imaging studies noted on E-chart.  His pain is described as sharp, dull, stabbing, tingling, aching intermittently, and is constant.  His pain is worse with extension in the lumbar area.  His pain right now is 5/10, but averages 7 and increases with activity to a 10.  His pain is worse with walking, bending, sitting, standing, rest, heat, medication.  He has no prior history of trauma.  He thinks he was told that this may be a congenital problem.  Relief from meds is good.  He does not like the way certain medications like pain medication or muscle relaxants make him feel.  His pain does interfere with certain meal prep in the house and shopping, but otherwise independent.  REVIEW OF SYSTEMS:  Positive for numbness, tingling, more in the left lower extremity than right, trouble walking, depression, and anxiety. He had marked suicidal thoughts, and I questioned him further about this, he sometimes feels due to his pain that he wishes he was dead, but definitely has no plan.  He has seen Psychiatry in the past.  He also has a review of systems positive for constipation, poor appetite, shortness of breath.  PAST SURGICAL HISTORY:  Vasectomy.  Social alcohol abuse, sober x2.5 years.  Past history of marijuana. Past history of psilocybin mushrooms.  He in fact fell while on mushrooms  and hit his head suffering a small subarachnoid hemorrhage and a left cerebellar hematoma.  No residual family history of hypertension.  PSYCHIATRIC PROBLEMS:  Alcohol abuse and drug abuse.  Other treatments trial, he has seen a chiropractor with whom he has a disagreement.  He has had physical therapy, although he cannot tell me exactly what all they did with him.  He states that in the past he has seen someone from [facility] who felt like they "put their elbow through his back."  Blood pressure 132/53, pulse 78, respirations 18, and sat 100% room air. Well-developed, well-nourished Rose, anxious.  Neck range of motion is full.  Upper extremity range of motion strength, deep tendon reflexes and sensation are all normal.  Lower extremity strength, deep tendon reflexes, sensation are all normal.  Lumbar spine range of motion is normal for forward flexion, but limited to neutral for extension.  He is able to twist and do lateral bending at about 50% of normal range.  IMPRESSION:  Lumbar pain.  He has a history of spondylolisthesis, certainly no signs of radiculopathy despite his complaints of lower extremity symptoms.  He has had chiropractic treatment, some type of physical therapy, as well as an injection by Dr. Alvester Morin.  We will need to review further records to get further details on this.  Certainly looking at his opioid risk tool, he is at high risk for opioid misuse.  Opioid risk tool score of 11.  We will trial him on some Voltaren gel.  Review other  records.  Consider facet injections if they have not been performed.  May benefit from another round of physical therapy.  Would encourage him to seek Psychiatric help.  This has certainly been an issue for him in the past and I think that some of his pain is anxiety-related as well.     Erick Colace, M.D.    AEK/MedQ D:02/06/2011 17:38:49  T:02/06/2011 21:56:58  Job #:  [id number]  cc:   Rosalyn Gess. Norins,  MD [address] [place] [address]  Electronically Signed by Claudette Laws M.D. on 03/15/2011 10:17:52 AM

## 2011-03-16 ENCOUNTER — Encounter: Payer: Self-pay | Admitting: Internal Medicine

## 2011-03-18 ENCOUNTER — Emergency Department (HOSPITAL_BASED_OUTPATIENT_CLINIC_OR_DEPARTMENT_OTHER)
Admission: EM | Admit: 2011-03-18 | Discharge: 2011-03-18 | Disposition: A | Discharge status: Nursing Home (Includes ICF) | Payer: BC Managed Care – PPO | Source: Home / Self Care | Attending: Emergency Medicine | Admitting: Emergency Medicine

## 2011-03-18 ENCOUNTER — Emergency Department (INDEPENDENT_AMBULATORY_CARE_PROVIDER_SITE_OTHER): Payer: BC Managed Care – PPO

## 2011-03-18 ENCOUNTER — Emergency Department (HOSPITAL_COMMUNITY)
Admission: EM | Admit: 2011-03-18 | Discharge: 2011-03-18 | Disposition: A | Discharge status: Home / Self Care | Payer: BC Managed Care – PPO | Attending: Emergency Medicine | Admitting: Emergency Medicine

## 2011-03-18 DIAGNOSIS — S62639B Displaced fracture of distal phalanx of unspecified finger, initial encounter for open fracture: Secondary | ICD-10-CM | POA: Insufficient documentation

## 2011-03-18 DIAGNOSIS — S61209A Unspecified open wound of unspecified finger without damage to nail, initial encounter: Secondary | ICD-10-CM | POA: Insufficient documentation

## 2011-03-18 DIAGNOSIS — M79609 Pain in unspecified limb: Secondary | ICD-10-CM | POA: Insufficient documentation

## 2011-03-18 DIAGNOSIS — W208XXA Other cause of strike by thrown, projected or falling object, initial encounter: Secondary | ICD-10-CM | POA: Insufficient documentation

## 2011-03-18 DIAGNOSIS — Y92009 Unspecified place in unspecified non-institutional (private) residence as the place of occurrence of the external cause: Secondary | ICD-10-CM | POA: Insufficient documentation

## 2011-03-18 DIAGNOSIS — Y929 Unspecified place or not applicable: Secondary | ICD-10-CM | POA: Insufficient documentation

## 2011-03-18 DIAGNOSIS — IMO0002 Reserved for concepts with insufficient information to code with codable children: Secondary | ICD-10-CM

## 2011-03-18 DIAGNOSIS — G8929 Other chronic pain: Secondary | ICD-10-CM | POA: Insufficient documentation

## 2011-03-18 DIAGNOSIS — F411 Generalized anxiety disorder: Secondary | ICD-10-CM | POA: Insufficient documentation

## 2011-03-20 ENCOUNTER — Telehealth: Payer: Self-pay | Admitting: *Deleted

## 2011-03-20 NOTE — Telephone Encounter (Signed)
Wife called - Pt received finger fracture and had surgery. He was given percocet x 5 days by hospital. Lorain Childes for MD

## 2011-03-21 NOTE — H&P (Signed)
NAME:  NIRAJ, KUDRNA NO.:  [id number]  MEDICAL RECORD NO.:  [id number]           PATIENT TYPE:  E  LOCATION:  [facility]                         FAC[facility]  [facility]  PHYSICIAN:  Betha Loa, MD        DATE OF BIRTH:  [birth year]  DATE OF ADMISSION:  03/18/2011 DATE OF DISCHARGE:  03/18/2011                             HISTORY & PHYSICAL   CHIEF COMPLAINT:  Right long fingertip injury.  HISTORY:  Mr. Say is a 30 year old right-hand dominant white male who was working in his garage when a 2 x 4 that was attached to garage door spring came loose and was pulled by the spring into the right long finger.  He had a wound at the tip of the finger.  He reports no other injuries.  He presented to the [facility] where he was evaluated and he was referred to me for further care.  He was transferred to [facility] for my evaluation.  He reports no other injuries. He has had some discomfort in the hand from hitting things in the past but does not remember any significant injuries to the hand.  ALLERGIES:  AVELOX causes vomiting.  PAST MEDICAL HISTORY: 1. Anxiety. 2. Depression. 3. Back pain.  PAST SURGERIES:  Wisdom teeth.  MEDICATIONS:  Hydrocodone, Xanax, and meloxicam.  FAMILY HISTORY:  Negative.  SOCIAL HISTORY:  Tobacco none.  Alcohol quit.  REVIEW OF SYSTEMS:  A 13-point review of systems negative exception of constipation.  PHYSICAL EXAMINATION:  VITAL SIGNS:  Temperature 98.7, pulse 81, respirations 18, and BP 115/71. GENERAL:  Alert and oriented x3. HEENT:  Head normocephalic and atraumatic. NECK:  Supple.  Full range of motion. CHEST:  Regular rate and rhythm. LUNGS:  Clear to auscultation bilaterally. ABDOMEN:  Nontender and nondistended. EXTREMITIES:  Left upper extremity is distally neurovascularly intact in radial, median, and ulnar distribution.  He can flex and extend the IP joint of the thumb and cross his fingers.  There are no wounds.   No tenderness to palpation.  Right upper extremity with exception of long finger, he has intact sensation and capillary refill in all fingertips. He can flex and extend the IP joint of thumb and cross his fingers.  In the long finger, he has had a digital block.  There is capillary refill in the fingertip.  There is wound coursing through the nail bed.  He is able to flex and extend at the DIP joint.  RADIOGRAPHS:  AP, lateral, and oblique views of the long finger show a comminuted fracture with intra-articular extension of the distal phalanx.  There is some displacement of this.  No fractures are noted of the proximal or middle phalanges.  ASSESSMENT AND PLAN:  Right long fingertip injury with open fracture and nail bed injury.  I discussed with Mr. Borchardt the nature of this condition.  I recommended going to the operating room for irrigation and debridement of the wound, percutaneous pinning of the fracture, and repair of the laceration of his nail bed.  Risks, benefits, and alternative of surgery were discussed including risk  of blood loss, infection, damage to nerves, vessels, tendons, ligaments, and bone, failure of surgery, need for additional surgery, complications with wound healing, continued pain, nonunion, malunion, stiffness, and osteomyelitis.  He voiced understanding these risks and elected to proceed.  We will have surgery arranged as soon as possible.     Betha Loa, MD     KK/MEDQ  D:  03/18/2011  T:  03/19/2011  Job:  [id number]  Electronically Signed by Betha Loa  on 03/21/2011 10:46:34 AM

## 2011-03-21 NOTE — Op Note (Signed)
NAME:  [name], [name] NO.:  [id number]  MEDICAL RECORD NO.:  [id number]           PATIENT TYPE:  E  LOCATION:  [facility]                         FACILITY:  [facility]  PHYSICIAN:  Betha Loa, MD        DATE OF BIRTH:  11/27/1981  DATE OF PROCEDURE:  03/18/2011 DATE OF DISCHARGE:                              OPERATIVE REPORT   PREOPERATIVE DIAGNOSIS:  Right long finger open P3 fracture, nail bed laceration, skin lacerations.  POSTOPERATIVE DIAGNOSIS:  Right long finger open P3 fracture, nail bed laceration, skin lacerations.  PROCEDURE:  Right long finger irrigation and debridement of open P3 fracture, open reduction and percutaneous pinning of fracture, and repair of lacerations in nail bed.  SURGEON:  Betha Loa, MD  ASSISTANT:  None.  ANESTHESIA:  General.  IV FLUIDS:  Per Anesthesia flow sheet.  ESTIMATED BLOOD LOSS:  Minimal.  COMPLICATIONS:  None.  SPECIMENS:  None.  TOURNIQUET TIME:  62 minutes.  DISPOSITION:  Stable to PACU.  INDICATIONS:  Mr. [name] is a 30 year old white male who was working in his garage when a 2 x 4 attached to the garage door spring pulled across his right long finger lacerating the tip.  He went to [facility] where he was evaluated.  I was consulted for management of his injuries.  Radiographs were taken revealing a fracture and he had open injuries.  On evaluation, he already had a digital block.  He had good capillary refill in the fingertip.  There were open lacerations in nail bed with underlying fracture.  On radiographs, he had an intra-articular comminuted fracture of the distal phalanx.  I recommended to Mr. [name] going to the operating room for irrigation and debridement of the open fracture and wounds and pinning of the fracture as well as repair of the wounds.  Risks, benefits, and alternatives of surgery were discussed including risk of blood loss, infection, damage to nerves, vessels, tendons,  ligaments, and bone, failure of procedure, need for additional procedures, complications with wound healing, continued pain, nonunion, malunion and stiffness.  He voiced understanding these risks and elected to proceed.  OPERATIVE COURSE:  After being identified preoperatively in the holding room by myself, the patient and I agreed upon procedure and site of procedure.  Surgical site was marked.  The risks, benefits, and alternatives of surgery were reviewed and he wished to proceed. Surgical consent had been signed.  He had been given Ancef and his tetanus updated in the emergency department.  His Ancef was redosed prior to going back to the operating room.  He was transported to the operating room and placed on the operating room table in supine position with the right upper extremity on arm board.  General anesthesia was induced by the anesthesiologist.  The right upper extremity was prepped and draped in normal sterile orthopedic fashion.  A surgical pause was performed between surgeons, Anesthesia, and operating room staff and all were in agreement as to patient, procedure, and site of procedure. Tourniquet at proximal aspect of the extremity was inflated to 250 mmHg after exsanguination of  limb with an Esmarch bandage.  The wound was explored.  The nail had been avulsed from the wound.  It was cleared of hematoma.  A 1000 mL of sterile saline was used to irrigate the wound. It was not very dirty.  The nail bed laceration was transverse mostly but was stellate at the ulnar side.  A 0.035-inch K-wire was advanced from the tip of the finger across the fracture site into the middle phalanx.  The DIP was placed in extension.  This was adequate to reduce the major portion of the fracture.  There was an intra-articular portion at the ulnar side.  A 0.028-inch K-wire was used to stabilize this from the ulnar side.  The skin at the volar aspect of the finger was repaired using 5-0 Monocryl  suture in an interrupted fashion.  The nail bed was repaired using 6-0 chromic gut suture in an interrupted fashion.  This was adequate to oppose all soft tissues well and provide full bone coverage.  The C-arm was used in AP and lateral projections to ensure appropriate reduction and placement of hardware which was the case.  The pins were bent and cut short and pin caps placed.  The nail fold was kept open with a piece of Xeroform.  All wounds were dressed with Xeroform and 2 x 2 and wrapped with Kling and Coban dressing.  An AlumaFoam splint was placed at the PIP and DIP joints in full extension. This was wrapped lightly with Coban as well.  The tourniquet was deflated at 62 minutes.  The operative drapes were broken down.  The patient was awoken from anesthesia safely.  He was transferred back to stretcher and taken to PACU in stable condition.  I will give him Percocet 5/325 one to two p.o. q.6 h p.r.n. pain, dispensed #50 and Bactrim DS one p.o. b.i.d. x7 days.  I will see him back in 1 week for postoperative follow up.     Betha Loa, MD     KK/MEDQ  D:  03/18/2011  T:  03/19/2011  Job:  [id number]  Electronically Signed by Betha Loa  on 03/21/2011 10:55:55 AM

## 2011-03-23 ENCOUNTER — Emergency Department (HOSPITAL_COMMUNITY)
Admission: EM | Admit: 2011-03-23 | Discharge: 2011-03-23 | Disposition: A | Discharge status: Home / Self Care | Payer: BC Managed Care – PPO | Attending: Emergency Medicine | Admitting: Emergency Medicine

## 2011-03-23 DIAGNOSIS — F341 Dysthymic disorder: Secondary | ICD-10-CM | POA: Insufficient documentation

## 2011-03-23 DIAGNOSIS — M549 Dorsalgia, unspecified: Secondary | ICD-10-CM | POA: Insufficient documentation

## 2011-03-23 DIAGNOSIS — Z79899 Other long term (current) drug therapy: Secondary | ICD-10-CM | POA: Insufficient documentation

## 2011-03-23 DIAGNOSIS — R112 Nausea with vomiting, unspecified: Secondary | ICD-10-CM | POA: Insufficient documentation

## 2011-03-23 DIAGNOSIS — G8929 Other chronic pain: Secondary | ICD-10-CM | POA: Insufficient documentation

## 2011-04-04 ENCOUNTER — Ambulatory Visit: Payer: BC Managed Care – PPO | Admitting: Physical Medicine & Rehabilitation

## 2011-04-05 NOTE — Telephone Encounter (Signed)
  Phone Note Other Incoming   Request: Send information Summary of Call: Records received from [facility] (27 Pages) forwarded to Dr. Debby Bud.

## 2011-04-05 NOTE — Progress Notes (Signed)
Summary: discuss ortho/lb   Vital Signs:  Patient profile:   30 year old male Height:      74 inches Weight:      171 pounds BMI:     22.03 O2 Sat:      98 % on Room air Temp:     98.6 degrees F oral Pulse rate:   77 / minute BP sitting:   116 / 82  (left arm) Cuff size:   regular  Vitals Entered By: Ami Bullins CMA (December 22, 2010 11:12 AM)  O2 Flow:  Room air  Primary Care Provider:  Michael E Norins MD   History of Present Illness: [name] presents today for progressive pain in the back and now with loss of feeling left heel, lateral aspect over the past month. He does have pain that radiates down to the knee. This numbness does not interfere with walking or balance.   He also reports that he looses grip strength either in one or both hands. Usually of limited duration and normal strength returns. This has been a problem for many years.   Current Medications (verified): 1)  Alprazolam 1 Mg Tabs (Alprazolam) .... 1 Tablet Four Times A Day 2)  Norco 7.5-325 Mg Tabs (Hydrocodone-Acetaminophen) .... 1 Four Times A Day As Needed Pain 3)  Meloxicam 15 Mg Tabs (Meloxicam) .... 1 By Mouth Once Daily 4)  Ranitidine Hcl 150 Mg Caps (Ranitidine Hcl) .... 1 By Mouth Two Times A Day  Allergies (verified): 1)  ! * Ssri 2)  ! * Snri's 3)  ! * Avelox  Past History:  Past Medical History: Last updated: 09/29/2010 HEADACHE, TENSION (ICD-307.81) ANXIETY DEPRESSION (ICD-300.4) ALCOHOL ABUSE, HX OF (ICD-V11.3)  Past Surgical History: Last updated: 09/29/2010 Vasectomy May '11 Trauma - skull fracture '04  Family History: Last updated: 09/29/2010 Father- 1955 - HTN, Lipids Mother -deceased 55 - EtOH/Drug related disease, depression/anxiety Neg- colon or prostate cancer; CAD; DM Positive for depression/anxiety; alcohol abuse.  Social History: Last updated: 09/29/2010 HSG, attending [employer] ( oct o'11) Married - June '11 No children work - full time student No history of  physical or sexual abuse  Review of Systems  The patient denies anorexia, weight loss, weight gain, chest pain, dyspnea on exertion, headaches, abdominal pain, hematochezia, hematuria, muscle weakness, suspicious skin lesions, depression, abnormal bleeding, and angioedema.    Physical Exam  General:  slender and tall white male in no distress Head:  normocephalic and atraumatic.   Eyes:  C&S clear Lungs:  normal respiratory effort.   Heart:  normal rate and regular rhythm.   Msk:  back exam: nl flex to 180 but difficulty standing back up straight. Nl gait, toe and heel walk altough he complained of discomfort iwth toe walk. Able to step up to exam. DTRs 2+ at patellar tendon. Nl SLR sitting. Sensation normal to light touch and pin prick left LE except for lateral aspect of heel.  Nl deep vibratory sensation. Pulses:  2+ radial Neurologic:  alert & oriented X3.   Skin:  turgor normal and color normal.   Psych:  Oriented X3, normally interactive, good eye contact, and not anxious appearing.  Appears to have a flat affect.   Impression & Recommendations:  Problem # 1:  PARESTHESIA (ICD-782.0) Pateint with paresthesia left heel. Exam with no radicular findings. Concern for metabolic cause of symptoms.  Plan - r/o  B12 or thyroid deficiency.  Orders: TLB-B12 + Folate Pnl (82746_82607-B12/FOL) TLB-TSH (Thyroid Stimulating Hormone) (84443-TSH)    Problem # 2:  BACK PAIN, LUMBAR, CHRONIC (ICD-724.2) Patient asking about stronger pain medication. Advised that alternatives to narcotic pain medication is worth pursuing.  Plan - no change in medication           referral for pain management   His updated medication list for this problem includes:    Norco 7.5-325 Mg Tabs (Hydrocodone-acetaminophen) ..... 1 four times a day as needed pain    Meloxicam 15 Mg Tabs (Meloxicam) ..... 1 by mouth once daily  Problem # 3:  ANXIETY DEPRESSION (ICD-300.4) Dr. Dekle is changing practice and [name] wishes at this juncture to move his care to [place].  He is provided names and tele #'s for Drs. McKinney, Plovsky and Kaur.  Complete Medication List: 1)  Alprazolam 1 Mg Tabs (Alprazolam) .... 1 tablet four times a day 2)  Norco 7.5-325 Mg Tabs (Hydrocodone-acetaminophen) .... 1 four times a day as needed pain 3)  Meloxicam 15 Mg Tabs (Meloxicam) .... 1 by mouth once daily 4)  Ranitidine Hcl 150 Mg Caps (Ranitidine hcl) .... 1 by mouth two times a day   Orders Added: 1)  TLB-B12 + Folate Pnl [[id number]-B12/FOL] 2)  TLB-TSH (Thyroid Stimulating Hormone) [84443-TSH] 3)  Est. Patient Level III [99213] 

## 2011-04-05 NOTE — Telephone Encounter (Signed)
Summary: RF  Phone Note Call from Patient   Caller: Wife Karyn [phone]  Summary of Call: Patient is requesting refill of alprazolam 1mg 1 four times a day #150. Previously written by Dr Dekle. He is req rx asap - wife says pt is having panic attack. Initial call taken by: Sarah Douglas, CMA,  January 23, 2011 2:04 PM  Follow-up for Phone Call        OK one per MD - Pt informed - He has apt w/Dr Plosky in april.   ALSO - wife left vm, pt's cyst is acting up? Wants referral to surgeon.  Follow-up by: Sarah Douglas, CMA,  January 23, 2011 2:08 PM  Additional Follow-up for Phone Call Additional follow up Details #1::        OK for referral for pilonidal cyst. PCC notified Additional Follow-up by: Michaiah Holsopple E Brielle Moro MD,  January 23, 2011 5:33 PM  New Problems: PILONIDAL CYST (ICD-685.1)   New Problems: PILONIDAL CYST (ICD-685.1) Prescriptions: ALPRAZOLAM 1 MG TABS (ALPRAZOLAM) 1 tablet four times a day  #150 x 0   Entered by:   Sarah Douglas, CMA   Authorized by:   Vernice Mannina E Tiajah Oyster MD   Signed by:   Sarah Douglas, CMA on 01/23/2011   Method used:   Telephoned to ...       [facility] [address] # [id number]* (retail)       [address]       [place], [place]  [address]       Ph: [phone]       Fax: [phone]   RxID:   [id number]  

## 2011-04-05 NOTE — Progress Notes (Signed)
Summary: follow up, discuss recent labs   Vital Signs:  Patient profile:   29 year old male Height:      74 inches Weight:      172 pounds BMI:     22.16 O2 Sat:      97 % on Room air Temp:     97.9 degrees F oral Pulse rate:   88 / minute Resp:     16 per minute BP sitting:   116 / 62  (left arm)  Vitals Entered By: Sharon Scates CMA(AAMA) (January 19, 2011 11:38 AM)  O2 Flow:  Room air CC: Pt here to discuss lab results & MRI..Pt also c/o of pain in back to be assessed/sls,cma Is Patient Diabetic? No   Primary Care Provider:  Ladonna Vanorder E Marlei Glomski MD  CC:  Pt here to discuss lab results & MRI..Pt also c/o of pain in back to be assessed/sls and cma.  History of Present Illness: [name] has seen Merideth Baker, NP with [facility]. He did not do well with lamictal and then the office would not refill his xanax. He does not wish to return to them. He is planning to return  to  Dr. Dekle's old office. He will need refill of alprazolam. If he cannot get an appoiintment I will provide interim prescriptions but I made it clear that psych should be managing his medications.  He has stopped Physical therapy. He has been referred by his ortho to a chiropractor. He does have an appt with Dr. Kirstens March 5th - encouraged to keep this appointment.   Current Medications (verified): 1)  Alprazolam 1 Mg Tabs (Alprazolam) .... 1 Tablet Four Times A Day 2)  Norco 7.5-325 Mg Tabs (Hydrocodone-Acetaminophen) .... 1 Four Times A Day As Needed Pain 3)  Meloxicam 15 Mg Tabs (Meloxicam) .... 1 By Mouth Once Daily  Allergies (verified): 1)  ! * Ssri 2)  ! * Snri's 3)  ! * Avelox  Past History:  Past Medical History: Last updated: 09/29/2010 HEADACHE, TENSION (ICD-307.81) ANXIETY DEPRESSION (ICD-300.4) ALCOHOL ABUSE, HX OF (ICD-V11.3)  Past Surgical History: Last updated: 09/29/2010 Vasectomy May '11 Trauma - skull fracture '04 FH reviewed for relevance, SH/Risk Factors reviewed  for relevance  Review of Systems  The patient denies anorexia, weight loss, weight gain, syncope, headaches, abdominal pain, muscle weakness, difficulty walking, and enlarged lymph nodes.    Physical Exam  General:  slender white man in no distress Head:  normocephalic and atraumatic.   Eyes:  C&S clear Neck:  supple.   Lungs:  normal respiratory effort.   Heart:  normal rate and regular rhythm.   Msk:  normal ROM.   Neurologic:  alert & oriented X3 and gait normal.   Skin:  turgor normal and color normal.   Psych:  Oriented X3, memory intact for recent and remote, normally interactive, and good eye contact.     Impression & Recommendations:  Problem # 1:  BACK PAIN, LUMBAR, CHRONIC (ICD-724.2) He will keep his appointment with Dr. Kirstens at [facility]/pain. OK to also see chiropractor should he so choose. Encouraged him to use norco sparingly - no refill today  His updated medication list for this problem includes:    Norco 7.5-325 Mg Tabs (Hydrocodone-acetaminophen) ..... 1 four times a day as needed pain    Meloxicam 15 Mg Tabs (Meloxicam) ..... 1 by mouth once daily  Problem # 2:  ANXIETY DEPRESSION (ICD-300.4) Appears to be stable at today's visit. Plan is   outline in HPI.  Complete Medication List: 1)  Alprazolam 1 Mg Tabs (Alprazolam) .... 1 tablet four times a day 2)  Norco 7.5-325 Mg Tabs (Hydrocodone-acetaminophen) .... 1 four times a day as needed pain 3)  Meloxicam 15 Mg Tabs (Meloxicam) .... 1 by mouth once daily   Orders Added: 1)  Est. Patient Level III [99213] 

## 2011-04-05 NOTE — Miscellaneous (Signed)
Summary: Orders Update  Clinical Lists Changes  Orders: Added new Referral order of Pain Clinic Referral (Pain) - Signed 

## 2011-04-05 NOTE — Letter (Signed)
Summary: [facility]  [facility]   Imported By: Lennie Odor 11/11/2010 12:20:58  _____________________________________________________________________  External Attachment:    Type:   Image     Comment:   External Document

## 2011-04-05 NOTE — Letter (Signed)
Summary: Primary Care Consult Scheduled Letter  [facility]  [address]   [place], [place] [address]   Phone: [phone]  Fax: [phone]      12/27/2010 MRN: [id number]  Melchor Kryder [address] [place], [place]  [address]    Dear Mr. Onley,      We have scheduled an appointment for you.  At the recommendation of Dr.Norins we have scheduled you a consult with [facility] (Dr. Kirsteins) on 02-06-2011 at 10:45 am . Their address is [address], [address], [place], [place] [address]. The office phone number is [phone].  If this appointment day and time is not convenient for you, please feel free to call the office of the doctor you are being referred to at the number listed above and reschedule the appointment.     It is important for you to keep your scheduled appointments. We are here to make sure you are given good patient care. If you have questions or you have made changes to your appointment, please notify us at  [phone]        , ask for   Debra           .     Thank you,  Patient Care Coordinator [facility] 

## 2011-04-05 NOTE — Telephone Encounter (Signed)
Summary: Hydrocodone  Phone Note Call from Patient Call back at Home Phone [phone]   Summary of Call: Pt states that at last office visit MD advised pt to take hydrocodone four times a day. Patient is requesting rx to reflect this.  Initial call taken by: Lamar Sprinkles, CMA,  October 26, 2010 3:54 PM  Follow-up for Phone Call        Hmmmmm! My note doesn't reflect that but I may have said he could take it every 6 hours. Change med list to reflect q6 dosing = #120, 2 refills. Thanks Follow-up by: Jacques Navy MD,  October 26, 2010 5:22 PM  Additional Follow-up for Phone Call Additional follow up Details #1::        pt & pharm informed. Canceled previous rx's refill of qty # and called in new Additional Follow-up by: Lamar Sprinkles, CMA,  October 26, 2010 6:05 PM    New/Updated Medications: NORCO 7.5-325 MG TABS (HYDROCODONE-ACETAMINOPHEN) 1 four times a day as needed pain Prescriptions: NORCO 7.5-325 MG TABS (HYDROCODONE-ACETAMINOPHEN) 1 four times a day as needed pain  #120 x 2   Entered by:   Lamar Sprinkles, CMA   Authorized by:   Jacques Navy MD   Signed by:   Lamar Sprinkles, CMA on 10/26/2010   Method used:   Telephoned to ...       [facility] # [phone]* (retail)       [address]       [place], [place]  [address]       Ph: [phone]       Fax: [phone]   RxID:   [id number]

## 2011-04-05 NOTE — Telephone Encounter (Signed)
Summary: back pain- RF HYDROCODONE  Phone Note Call from Patient Call back at Home Phone [phone]   Caller: [name] [phone] Summary of Call: Patient wife called lmovm stating that he is having very bad back pain. She is requesting a return call to her or him   Follow-up for Phone Call        Spoke w/pt's wife, pt missed some PT apt's but has rescheduled. He is req refill of hydrocodone, OK?  Also advised he be persistent w/therapy and come in for re-eval if pain is different or worse - Wife agreed. Follow-up by: Lamar Sprinkles, CMA,  October 24, 2010 12:44 PM  Additional Follow-up for Phone Call Additional follow up Details #1::        OK to refill Norco with 1 add'l Additional Follow-up by: Jacques Navy MD,  October 24, 2010 5:19 PM    Prescriptions: NORCO 7.5-325 MG TABS (HYDROCODONE-ACETAMINOPHEN) 1 by mouth three times a day prn  #90 x 1   Entered by:   Lamar Sprinkles, CMA   Authorized by:   Jacques Navy MD   Signed by:   Lamar Sprinkles, CMA on 10/24/2010   Method used:   Telephoned to ...       [facility] # [phone]* (retail)       [address]       [place], [place]  [address]       Ph: [phone]       Fax: [phone]   RxID:   [id number]

## 2011-04-05 NOTE — Progress Notes (Signed)
Summary: OV--PER WIFE  STC   Vital Signs:  Patient profile:   30 year old male Height:      74 inches Weight:      175 pounds BMI:     22.55 O2 Sat:      98 % on Room air Temp:     98.0 degrees F oral Pulse rate:   73 / minute BP sitting:   122 / 82  (left arm) Cuff size:   regular  Vitals Entered By: Ami Bullins CMA (December 27, 2010 1:02 PM)  O2 Flow:  Room air  Primary Care Provider:  Julisa Flippo E Ronita Hargreaves MD   History of Present Illness: Patient presents requesting a letter that he can submit to his school providing medical support for withdrawing from class.  He also is asking if it would be oK with me for him to see a NP working with [facility], MD to establish for local psychiatric care - which of course if fine.   Current Medications (verified): 1)  Alprazolam 1 Mg Tabs (Alprazolam) .... 1 Tablet Four Times A Day 2)  Norco 7.5-325 Mg Tabs (Hydrocodone-Acetaminophen) .... 1 Four Times A Day As Needed Pain 3)  Meloxicam 15 Mg Tabs (Meloxicam) .... 1 By Mouth Once Daily 4)  Ranitidine Hcl 150 Mg Caps (Ranitidine Hcl) .... 1 By Mouth Two Times A Day  Allergies (verified): 1)  ! * Ssri 2)  ! * Snri's 3)  ! * Avelox PMH-FH-SH reviewed-no changes except otherwise noted   Complete Medication List: 1)  Alprazolam 1 Mg Tabs (Alprazolam) .... 1 tablet four times a day 2)  Norco 7.5-325 Mg Tabs (Hydrocodone-acetaminophen) .... 1 four times a day as needed pain 3)  Meloxicam 15 Mg Tabs (Meloxicam) .... 1 by mouth once daily 4)  Ranitidine Hcl 150 Mg Caps (Ranitidine hcl) .... 1 by mouth two times a day   Orders Added: 1)  Est. Patient Level I [99211] 

## 2011-04-05 NOTE — Telephone Encounter (Signed)
Summary: Pain med?  Phone Note Call from Patient Call back at [facility] Phone [phone]   Summary of Call: Pt's wife called and said pt was advised to call office if he needed more pain medication? Pt would like refill of pain medication Initial call taken by: Lamar Sprinkles, CMA,  September 29, 2010 5:11 PM  Follow-up for Phone Call        called patinet. Will call in Rx.

## 2011-04-05 NOTE — Letter (Signed)
Summary: Generic Letter  [facility]  [address]   [place], [place] [address]   Phone: [phone]  Fax: [phone]          12/27/2010  Re: [name]     DOB 07/02/1981     MRN # [id number]  To Whom It May Concern:  [name] is follow in out practice for chronic back pain, chronic headache and anxiety/depression that has been managed by psychiatry.   Over the past several months his condidtion has deteriorated with increased problems with back pain and headache such that he cannot sit for prolonged periods of time and has increased difficulty with concentration due to the pain, pain meds and headache.  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 His medical problems have significantly interfered with his ability to continue his studies.   Thank you for your consideration.   Sincerely,   Derald Lorge MD 

## 2011-04-05 NOTE — Progress Notes (Signed)
Summary: NEW [employer] PT--#--PKG--STC   Vital Signs:  Patient profile:   29 year old male Height:      74 inches Weight:      171 pounds BMI:     22.03 O2 Sat:      97 % on Room air Temp:     98.2 degrees F oral Pulse rate:   74 / minute BP sitting:   122 / 72  (left arm) Cuff size:   regular  Vitals Entered By: Bill Salinas CMA (September 29, 2010 1:26 PM)  O2 Flow:  Room air CC: new pt here to est care with primary MD/ ab   Primary Care Provider:  Jacques Navy MD  CC:  new pt here to est care with primary MD/ ab.  History of Present Illness: Present presents to establish for on-going primary care.  Has problem with low back pain: sponylolisthesis L5-S1, bulging disk at L5-S1. Studies ordered Dr. Izora Gala. He was last seen last week. For steroid injection today by Dr. Prince Rome. He is currently doing PT with [facility]. Has not responded to muscle relaxants. Currently on Hydorcodone/APAP 7.5/325. Has failed NSAIDs and has had dyspepsia.   He has a pilonidal cyst - which has been intermittently treated. He has not seen a Careers adviser.  Sees Dr. Leatrice Jewels for anxiety management.   Preventive Screening-Counseling & Management  Alcohol-Tobacco     Alcohol drinks/day: 0     Smoking Status: quit     Year Quit: 2009  Caffeine-Diet-Exercise     Caffeine use/day: 3 cups per day     Diet Comments: healthy diet kind of     Does Patient Exercise: no  Hep-HIV-STD-Contraception     Hepatitis Risk: no risk noted     HIV Risk: no risk noted     STD Risk: no risk noted     Dental Visit-last 6 months yes     Sun Exposure-Excessive: no  Safety-Violence-Falls     Seat Belt Use: yes     Helmet Use: yes     Firearms in the Home: no firearms in the home     Smoke Detectors: yes     Violence in the Home: no risk noted     Sexual Abuse: no     Fall Risk: low fall risk      Sexual History:  currently monogamous.        Drug Use:  former.        Blood Transfusions:  no.    Current  Medications (verified): 1)  Alprazolam 1 Mg Tabs (Alprazolam) .Marland Kitchen.. 1 Tablet Four Times A Day 2)  Norco 7.5-325 Mg Tabs (Hydrocodone-Acetaminophen) .Marland Kitchen.. 1 By Mouth Three Times A Day Prn 3)  Meloxicam 15 Mg Tabs (Meloxicam) .Marland Kitchen.. 1 By Mouth Once Daily 4)  Ranitidine Hcl 150 Mg Caps (Ranitidine Hcl) .Marland Kitchen.. 1 By Mouth Two Times A Day  Allergies (verified): 1)  ! * Ssri 2)  ! * Snri's 3)  ! * Avelox  Past History:  Past Medical History: HEADACHE, TENSION (ICD-307.81) ANXIETY DEPRESSION (ICD-300.4) ALCOHOL ABUSE, HX OF (ICD-V11.3)  Past Surgical History: Vasectomy May '11 Trauma - skull fracture '04  Family History: Father- 75 - HTN, Lipids Mother -deceased 64 - EtOH/Drug related disease, depression/anxiety Neg- colon or prostate cancer; CAD; DM Positive for depression/anxiety; alcohol abuse.  Social History: [employer], attending [employer] ( oct o'11) Married - June '11 No children work - full Neurosurgeon No history of physical or sexual abuseSmoking Status:  quit Caffeine use/day:  3 cups per day Sun Exposure-Excessive:  no Seat Belt Use:  yes Fall Risk:  low fall risk Does Patient Exercise:  no Hepatitis Risk:  no risk noted HIV Risk:  no risk noted STD Risk:  no risk noted Dental Care w/in 6 mos.:  yes Sexual History:  currently monogamous Drug Use:  former Blood Transfusions:  no  Review of Systems       The patient complains of anorexia, abdominal pain, severe indigestion/heartburn, and difficulty walking.  The patient denies fever, weight loss, weight gain, vision loss, decreased hearing, hoarseness, chest pain, syncope, dyspnea on exertion, prolonged cough, hemoptysis, melena, hematochezia, hematuria, muscle weakness, transient blindness, depression, enlarged lymph nodes, angioedema, and testicular masses.         NSAIDs, hemetemesis - mild. Due to back pain - leg weakness  Physical Exam  General:  Slender white male in no distress Head:  normocephalic and atraumatic.     Eyes:  vision grossly intact, pupils equal, pupils round, and corneas and lenses clear.   Ears:  External ear exam shows no significant lesions or deformities.  Otoscopic examination reveals clear canals, tympanic membranes are intact bilaterally without bulging, retraction, inflammation or discharge. Hearing is grossly normal bilaterally. Nose:  no external deformity and no external erythema.   Mouth:  Oral mucosa and oropharynx without lesions or exudates.  Teeth in good repair. Neck:  supple, full ROM, no thyromegaly, and no carotid bruits.   Chest Wall:  no deformities.   Lungs:  normal respiratory effort, normal breath sounds, and no wheezes.   Heart:  normal rate, regular rhythm, no murmur, and no JVD.   Abdomen:  soft, non-tender, and normal bowel sounds.   Msk:  normal ROM and no joint tenderness.  Back - able to stand without assist, able to step-up to exam table without assist, nl SLR sitting Pulses:  2+ radial Extremities:  No clubbing, cyanosis, edema, or deformity noted with normal full range of motion of all joints.   Neurologic:  alert & oriented X3, cranial nerves II-XII intact, gait normal, and DTRs symmetrical and normal.   Skin:  turgor normal, color normal, and no rashes.   Cervical Nodes:  no anterior cervical adenopathy and no posterior cervical adenopathy.   Psych:  Oriented X3, normally interactive, good eye contact, and not anxious appearing.     Impression & Recommendations:  Problem # 1:  BACK PAIN, LUMBAR, CHRONIC (ICD-724.2) Per patient report spondylolisthesis L5-S1 with associated disk bulge. He has been followed by Dr. August Saucer and Dr. Prince Rome. He has been prescribed hydorcodone for pain. He is participating in PT. He is scheduled for ESI today.  Discussed the use of controlled substances. Reviewed our policy: single prescriber, single pharmacy, no after hours or weekend calls for refills, random drug screens at doctor's descretion.  Patient advised that PT and  use of NSAIDs are best option. He has had a problem with non-selective NSAIDs in the past with stomach irritation. Suggested that COX 2 selective agents along with H2 blocker reduction of gastric acidity may be a solution.  His updated medication list for this problem includes:    Norco 7.5-325 Mg Tabs (Hydrocodone-acetaminophen) .Marland Kitchen... 1 by mouth three times a day prn    Meloxicam 15 Mg Tabs (Meloxicam) .Marland Kitchen... 1 by mouth once daily  Addendum - patient called this PM with request for medication. 30 Day supply of hydorcodone called in along with meloxicam and ranitidine.  Problem # 2:  HEADACHE, TENSION (ICD-307.81) Intermittent problem which he associates with his back pain.  His updated medication list for this problem includes:    Norco 7.5-325 Mg Tabs (Hydrocodone-acetaminophen) .Marland Kitchen... 1 by mouth three times a day prn    Meloxicam 15 Mg Tabs (Meloxicam) .Marland Kitchen... 1 by mouth once daily  Problem # 3:  ANXIETY DEPRESSION (ICD-300.4) Chronic problem along with a positive family history. He is under the care of a psychiatrist.  Problem # 4:  ALCOHOL ABUSE, HX OF (ICD-V11.3) clean and sober for 2 years. Discussed long term management and the high risk of recidivism. Stongly supported AA should he have any issues with maintaining his recovery.  Problem # 5:  Preventive Health Care (ICD-V70.0) Limited physical exam is unremarkable. He is to return for routine lab: glucose, lipids, renal function and liver functions. He will need a follow-up visit in 4 weeks.   Complete Medication List: 1)  Alprazolam 1 Mg Tabs (Alprazolam) .Marland Kitchen.. 1 tablet four times a day 2)  Norco 7.5-325 Mg Tabs (Hydrocodone-acetaminophen) .Marland Kitchen.. 1 by mouth three times a day prn 3)  Meloxicam 15 Mg Tabs (Meloxicam) .Marland Kitchen.. 1 by mouth once daily 4)  Ranitidine Hcl 150 Mg Caps (Ranitidine hcl) .Marland Kitchen.. 1 by mouth two times a day Prescriptions: RANITIDINE HCL 150 MG CAPS (RANITIDINE HCL) 1 by mouth two times a day  #60 x 12   Entered and  Authorized by:   Jacques Navy MD   Signed by:   Jacques Navy MD on 09/29/2010   Method used:   Telephoned to ...       [facility] # [phone]* (retail)       [address]       [place], [place]  [address]       Ph: [phone]       Fax: [phone]   RxID:   [id number] MELOXICAM 15 MG TABS (MELOXICAM) 1 by mouth once daily  #30 x 12   Entered and Authorized by:   Jacques Navy MD   Signed by:   Jacques Navy MD on 09/29/2010   Method used:   Telephoned to ...         RxID:   [id number] NORCO 7.5-325 MG TABS (HYDROCODONE-ACETAMINOPHEN) 1 by mouth three times a day prn  #90 x 0   Entered and Authorized by:   Jacques Navy MD   Signed by:   Jacques Navy MD on 09/29/2010   Method used:   Telephoned to ...         RxID:   [id number]    Orders Added: 1)  New Patient Level IV [41660]   Immunization History:  Tetanus/Td Immunization History:    Tetanus/Td:  historical (02/05/2003)   Immunization History:  Tetanus/Td Immunization History:    Tetanus/Td:  Historical (02/05/2003)

## 2011-04-05 NOTE — Letter (Signed)
Summary: Date Range: 08-24-10 to 09-21-10/[facility]  Date Range: 08-24-10 to 09-21-10/[facility]   Imported By: Sherian Rein 12/15/2010 14:48:31  _____________________________________________________________________  External Attachment:    Type:   Image     Comment:   External Document

## 2011-04-05 NOTE — Progress Notes (Signed)
Summary: PER MD--ALMOST 1 MTH FU---STC   Vital Signs:  Patient profile:   30 year old male Height:      74 inches Weight:      168 pounds BMI:     21.65 O2 Sat:      97 % on Room air Temp:     98.7 degrees F oral Pulse rate:   71 / minute BP sitting:   110 / 68  (left arm) Cuff size:   regular  Vitals Entered By: Bill Salinas CMA (October 20, 2010 3:32 PM)  O2 Flow:  Room air CC: follow-up visit/ ab   Primary Care Provider:  Jacques Navy MD  CC:  follow-up visit/ ab.  History of Present Illness: Patient returns for follow-up. In the interval he has had a chance to review the original note without corrections or additions.  He did have ESI per Dr. Prince Rome which was not successful. He did not get relief from meloxicam. He did have vicodin prescribed. He continues with physical therapy usually twice a week.   He reports that his depression and anxiety are worse. He did try fluoxetine but has [place]. He did see Dr. Duayne Cal 11/16 but no new medications were prescribed. Per patient Dr. Duayne Cal did not feel there was a need for hospitalization. He does have suicidal ideation but does not have a plan or materials on hand. He has a follow-up appointment next wednsday.   Current Medications (verified): 1)  Alprazolam 1 Mg Tabs (Alprazolam) .Marland Kitchen.. 1 Tablet Four Times A Day 2)  Norco 7.5-325 Mg Tabs (Hydrocodone-Acetaminophen) .Marland Kitchen.. 1 By Mouth Three Times A Day Prn 3)  Meloxicam 15 Mg Tabs (Meloxicam) .Marland Kitchen.. 1 By Mouth Once Daily 4)  Ranitidine Hcl 150 Mg Caps (Ranitidine Hcl) .Marland Kitchen.. 1 By Mouth Two Times A Day  Allergies (verified): 1)  ! * Ssri 2)  ! * Snri's 3)  ! * Avelox PMH-FH-SH reviewed-no changes except otherwise noted  Review of Systems       The patient complains of headaches and depression.  The patient denies anorexia, fever, weight loss, hoarseness, chest pain, dyspnea on exertion, abdominal pain, and muscle weakness.    Physical Exam  General:  Thin white male in no  medical distress Head:  Normocephalic and atraumatic without obvious abnormalities. No apparent alopecia or balding. Eyes:  vision grossly intact, pupils equal, and pupils round.  C&S clear Lungs:  normal respiratory effort.   Heart:  normal rate and regular rhythm.   Neurologic:  alert & oriented X3, cranial nerves II-XII intact, and gait normal.   Psych:  dysphoric affect, depressed affect, withdrawn, poor eye contact, and slightly anxious.     Impression & Recommendations:  Problem # 1:  BACK PAIN, LUMBAR, CHRONIC (ICD-724.2) He will need to contiue with ortho and PT. He is taking norco without side effects.  His updated medication list for this problem includes:    Norco 7.5-325 Mg Tabs (Hydrocodone-acetaminophen) .Marland Kitchen... 1 by mouth three times a day prn    Meloxicam 15 Mg Tabs (Meloxicam) .Marland Kitchen... 1 by mouth once daily  Problem # 2:  ANXIETY DEPRESSION (ICD-300.4) Seems much worse. He does have close follow -up with psychiatry.  Complete Medication List: 1)  Alprazolam 1 Mg Tabs (Alprazolam) .Marland Kitchen.. 1 tablet four times a day 2)  Norco 7.5-325 Mg Tabs (Hydrocodone-acetaminophen) .Marland Kitchen.. 1 by mouth three times a day prn 3)  Meloxicam 15 Mg Tabs (Meloxicam) .Marland Kitchen.. 1 by mouth once daily 4)  Ranitidine Hcl  150 Mg Caps (Ranitidine hcl) .Marland Kitchen.. 1 by mouth two times a day   Orders Added: 1)  Est. Patient Level III [13086]

## 2011-04-05 NOTE — Letter (Signed)
[facility] [address] [place], [place]  [address] Phone: [phone]      October 13, 2010   [facility] [address] [facility] [address] [place], [place] [address]  RE:  LAB RESULTS  Dear  Mr. Fear,  The following is an interpretation of your most recent lab tests.  Please take note of any instructions provided or changes to medications that have resulted from your lab work.  ELECTROLYTES:  Good - no changes needed  KIDNEY FUNCTION TESTS:  Good - no changes needed  LIVER FUNCTION TESTS:  Good - no changes needed  Health professionals look at cholesterol as more involved than just the total cholesterol. We consider the level of LDL (bad) cholesterol, HDL (good), cholesterol, and Triglycerides (Grease) in the blood.  1. Your LDL should be under 100, and the HDL should be over 45, if you have any vascular disease such as heart attack, angina, stroke, TIA (mini stroke), claudication (pain in the legs when you walk due to poor circulation),  Abdominal Aortic Aneurysm (AAA), diabetes or prediabetes.  2. Your LDL should be under 130 if you have any two of the following:     a. Smoke or chew tobacco,     b. High blood pressure (if you are on medication or over 140/90 without medication),     c. Male gender,    d. HDL below 40,    e. A male relative (father, brother, or son), who have had any vascular event          as described in #1. above under the age of 46, or a male relative (mother,       sister, or daughter) who had an event as described above under age 47. (An HDL over 60 will subtract one risk factor from the total, so if you have two items in # 2 above, but an HDL over 60, you then fall into category # 3 below).  3. Your LDL should be under 160 if you have any one of the above.  Triglycerides should be under 200 with the ideal being under 150.  For diabetes or pre-diabetes, the ideal HgbA1C should be under 6.0%.  If you fall into any of the above  categories, you should make a follow up appointment to discuss this with your physician.  LIPID PANEL:  Good - no changes needed Triglyceride: 172.0   Cholesterol: 165   LDL: 95   HDL: 35.50   Chol/HDL%:  5  DIABETIC STUDIES:  Excellent - no changes needed Blood Glucose: 89    Lab results are normal.  Please come see me if you have any questions about these lab results.   Sincerely Yours,    Jacques Navy MD Patient: [name] Note: All result statuses are Final unless otherwise noted.  Tests: (1) BMP (METABOL)   Sodium                    135 mEq/L                   135-145   Potassium                 4.2 mEq/L                   3.5-5.1   Chloride                  102 mEq/L  96-112   Carbon Dioxide            27 mEq/L                    19-32   Glucose                   89 mg/dL                    65-78   BUN                       17 mg/dL                    4-69   Creatinine                0.9 mg/dL                   6.2-9.5   Calcium                   9.4 mg/dL                   2.8-41.3   GFR                       108.82 mL/min               >60  Tests: (2) Hepatic/Liver Function Panel (HEPATIC)   Total Bilirubin           0.5 mg/dL                   2.4-4.0   Direct Bilirubin          0.0 mg/dL                   1.0-2.7   Alkaline Phosphatase      53 U/L                      39-117   AST                       20 U/L                      0-37   ALT                       28 U/L                      0-53   Total Protein             7.2 g/dL                    2.5-3.6   Albumin                   4.3 g/dL                    6.4-4.0  Tests: (3) Lipid Panel (LIPID)   Cholesterol               165 mg/dL                   3-474     ATP III Classification  Desirable:  < 200 mg/dL                    Borderline High:  200 - 239 mg/dL               High:  > = 240 mg/dL   Triglycerides        [H]  172.0 mg/dL                 1.6-109.6     Normal:   <150 mg/dL     Borderline High:  045 - 199 mg/dL   HDL                  [L]  40.98 mg/dL                 >11.91   VLDL Cholesterol          34.4 mg/dL                  4.7-82.9   LDL Cholesterol           95 mg/dL                    5-62  CHO/HDL Ratio:  CHD Risk                             5                    Men          Women     1/2 Average Risk     3.4          3.3     Average Risk          5.0          4.4     2X Average Risk          9.6          7.1     3X Average Risk          15.0          11.0

## 2011-05-30 ENCOUNTER — Telehealth: Payer: Self-pay | Admitting: *Deleted

## 2011-05-30 NOTE — Telephone Encounter (Signed)
Ok for refill with 2 add'l refills (3 total) then ov

## 2011-05-30 NOTE — Telephone Encounter (Signed)
Refill request for pts hydrocodone/acetaminophen 7.5-325 mg . SIG one table PO four times a day prn for pain. QTY 120 last fill was 01/18/2011. Please Advise refills

## 2011-05-31 ENCOUNTER — Other Ambulatory Visit: Payer: Self-pay | Admitting: Internal Medicine

## 2011-05-31 ENCOUNTER — Telehealth: Payer: Self-pay | Admitting: *Deleted

## 2011-05-31 DIAGNOSIS — M545 Low back pain: Secondary | ICD-10-CM

## 2011-05-31 MED ORDER — HYDROCODONE-ACETAMINOPHEN 7.5-325 MG PO TABS: 1.0000 | ORAL_TABLET | Freq: Four times a day (QID) | ORAL | Status: DC | PRN

## 2011-05-31 NOTE — Telephone Encounter (Signed)
Wife called - Patient requesting referral to [facility] - Dr Randon Goldsmith regarding his back problems.

## 2011-05-31 NOTE — Telephone Encounter (Signed)
No Dr. Ollen Bowl at [facility]; no neurosurgeon by that name that I can find or orthopedic

## 2011-05-31 NOTE — Telephone Encounter (Signed)
Per Wife, MD is at [facility], She read card w/MD's name, location [address] - Gwynne Edinger MD

## 2011-06-01 NOTE — Telephone Encounter (Signed)
Order place to [facility]

## 2011-06-02 ENCOUNTER — Encounter (INDEPENDENT_AMBULATORY_CARE_PROVIDER_SITE_OTHER): Payer: Self-pay | Admitting: Surgery

## 2011-06-02 ENCOUNTER — Ambulatory Visit (INDEPENDENT_AMBULATORY_CARE_PROVIDER_SITE_OTHER): Payer: BC Managed Care – PPO | Admitting: Surgery

## 2011-06-02 ENCOUNTER — Encounter (INDEPENDENT_AMBULATORY_CARE_PROVIDER_SITE_OTHER): Payer: Self-pay

## 2011-06-02 DIAGNOSIS — L0591 Pilonidal cyst without abscess: Secondary | ICD-10-CM

## 2011-06-02 NOTE — Progress Notes (Signed)
History: 30 year old white male with long-standing pilonidal cyst. Last evaluated February 2012 by Dr. Carman Ching. Patient has intermittent episodes of inflammation and drainage. He has been on recent antibiotic therapy due to a hand injury. Patient now desires evaluation for surgical resection of pilonidal cyst.  Review of systems: Over the past several days the patient has noted diminished symptoms. He now has little pain. He has no discharge.  Exam: HEENT shows him to be normocephalic. Sclerae are clear. Pupils equal and reactive. Dentition fair. Mucous membranes moist. Neck is supple nontender without mass. No thyroid nodules Chest is clear to auscultation bilaterally without rales rhonchi or wheeze Cardiac exam shows regular rate and rhythm without murmur Perineum shows sinus tracts in the natal cleft. Mild erythema. No drainage. No fluctuance. No tenderness.  Impression: Pilonidal cyst with minimal inflammatory changes at present  Plan: Patient would like to select a date for surgical resection. I have discussed the procedure of pilonidal cystectomy with open packing. I have expressed to the patient that this will take approximately 4-6 weeks to heal by secondary intention. He will require twice-daily dressing changes. Patient understands and wishes to proceed.

## 2011-07-17 ENCOUNTER — Encounter (INDEPENDENT_AMBULATORY_CARE_PROVIDER_SITE_OTHER): Payer: Self-pay

## 2011-07-31 ENCOUNTER — Ambulatory Visit (HOSPITAL_BASED_OUTPATIENT_CLINIC_OR_DEPARTMENT_OTHER)
Admission: RE | Admit: 2011-07-31 | Discharge: 2011-07-31 | Disposition: A | Discharge status: Home / Self Care | Payer: BC Managed Care – PPO | Source: Ambulatory Visit | Attending: Surgery | Admitting: Surgery

## 2011-07-31 ENCOUNTER — Other Ambulatory Visit (INDEPENDENT_AMBULATORY_CARE_PROVIDER_SITE_OTHER): Payer: Self-pay | Admitting: Surgery

## 2011-07-31 ENCOUNTER — Other Ambulatory Visit (INDEPENDENT_AMBULATORY_CARE_PROVIDER_SITE_OTHER): Payer: Self-pay

## 2011-07-31 DIAGNOSIS — L0591 Pilonidal cyst without abscess: Secondary | ICD-10-CM | POA: Insufficient documentation

## 2011-07-31 DIAGNOSIS — Z01812 Encounter for preprocedural laboratory examination: Secondary | ICD-10-CM | POA: Insufficient documentation

## 2011-07-31 DIAGNOSIS — L0501 Pilonidal cyst with abscess: Secondary | ICD-10-CM

## 2011-08-01 HISTORY — PX: OTHER SURGICAL HISTORY: SHX169

## 2011-08-02 ENCOUNTER — Telehealth (INDEPENDENT_AMBULATORY_CARE_PROVIDER_SITE_OTHER): Payer: Self-pay | Admitting: Surgery

## 2011-08-02 NOTE — Telephone Encounter (Signed)
Patient is requesting something stronger for pain. Taking Norco and Tramadol together, one every six hours and his pain level for his pilonidal is 5/6. He is also having all over muscle pain. He does take Mobic once a day for this, and it is not helping. Asking for something stronger to control pain. Also having constipation issues. Nurse was advised to tell him to start milk of magnesia as a first step. Also to be on a stool softener while taking narcotics.

## 2011-08-02 NOTE — Telephone Encounter (Signed)
RX: percocet 5/325 at front desk for pick up by wife. Patient aware to use percocet alone not with other narco or tramadol. Increase activity and fluids.  RMP

## 2011-08-02 NOTE — Telephone Encounter (Signed)
Pt had sx on poli cyst, is taking Norco and  Tramadell for his back, but that is not working for this sx. Dr. Gerrit Friends was aware of this and the pt opted to try his meds instead of getting a rx for pain first. Dr. Gerrit Friends told the pt to call if he needed something for pain, he does need a rx for pain.

## 2011-08-02 NOTE — Telephone Encounter (Signed)
Rx: percocet at front desk for pick up, start Miralax twice daily. Increase fluids and use percocet alone not with Narco or tramadol. Patient aware. RMP

## 2011-08-03 ENCOUNTER — Telehealth (INDEPENDENT_AMBULATORY_CARE_PROVIDER_SITE_OTHER): Payer: Self-pay | Admitting: General Surgery

## 2011-08-03 NOTE — Telephone Encounter (Signed)
PT'S WIFE CALLED TO SAY HE HAS NOT HAD A BOWEL MOVEMENT SINCE BEFORE SURGEERY. SHE WAS TOLD TO CALL  RE THIS, AND ALSO HIS TORSO IS SORE AND PAINFUL. I NOTIFIED DR. GERKIN AND HE SUGGESTED THE PATIENT START TAKING MOM 4TBS FOR BOWEL MOVEMENT ALONG WITH STOOL SOFTENER. TORSO ISSUE MAY BE DUE TO BACK PROBLEMS. SHOULD NOT BE FROM PILONIDAL CYST SURGERY. PT'S WIFE NOTIFIED.  TO CALL TOMORROW IF NO RESULTS FROM MOM.

## 2011-08-08 ENCOUNTER — Telehealth (INDEPENDENT_AMBULATORY_CARE_PROVIDER_SITE_OTHER): Payer: Self-pay | Admitting: Surgery

## 2011-08-09 ENCOUNTER — Telehealth (INDEPENDENT_AMBULATORY_CARE_PROVIDER_SITE_OTHER): Payer: Self-pay

## 2011-08-09 NOTE — Telephone Encounter (Signed)
C/o pain- level 8-9 at surgical site- last BM today- soft &  Formed- voiding without problems, increased pain with activity- now taking Narco 7.5 and tramadol but not helping. Percocet works better for his pain. Rx for Percocet written by Dr. Gerrit Friends # 30 - at front desk for pick. Patient aware. RMP

## 2011-08-10 NOTE — Op Note (Signed)
  NAME:  [name], [name] NO.:  [id number]  MEDICAL RECORD NO.:  [id number]  LOCATION:                                 FACILITY:  PHYSICIAN:  Velora Heckler, MD      DATE OF BIRTH:  [birth year]  DATE OF PROCEDURE:  07/31/2011                               OPERATIVE REPORT   PREOPERATIVE DIAGNOSIS:  Pilonidal cyst.  POSTOPERATIVE DIAGNOSIS:  Pilonidal cyst.  PROCEDURE:  Pilonidal cystectomy with open packing.  SURGEON:  Velora Heckler, MD, FACS  ANESTHESIA:  General.  ESTIMATED BLOOD LOSS:  Minimal.  PREPARATION:  Betadine.  COMPLICATIONS:  None.  INDICATIONS:  The patient is a 30 year old white male who has been evaluated intermittently since February 2012 for inflammation, drainage, and pain at the site of pilonidal cyst.  The patient now comes to surgery for definitive excision.  BODY OF REPORT:  Procedure is done in OR #5 at the [facility].  The patient was brought to the operating room and placed on a stretcher.  Following induction of general anesthesia, he was turned to a prone position.  Botox and natal cleft were then prepped and draped in usual strict aseptic fashion.  After ascertaining that an adequate level of anesthesia had been achieved, the area in the natal cleft occupied by multiple small sinus tracts was excised.  This was performed using the electrocautery to excise an ellipse of skin containing the sinus tracts.  Dissection was carefully carried into the subcutaneous tissues taking care to avoid any chronic granulation tissue or abscess cavities.  Dissection was carried deeply to the fascia.  The entire pilonidal cyst was excised and submitted to Pathology for review.  Local anesthetic was injected subcutaneously and deeply throughout the wound.  Good hemostasis was achieved with electrocautery.  Wound was packed with half inch Iodoform gauze packing.  It was covered with dry gauze followed by an ABD pad.  The  patient was then turned back to a supine position, awakened from anesthesia, and brought to the recovery room.  The patient tolerated the procedure well.   Velora Heckler, MD, FACS     TMG/MEDQ  D:  07/31/2011  T:  07/31/2011  Job:  [id number]  cc:   Rosalyn Gess. Norins, MD  Electronically Signed by Darnell Level MD on 08/10/2011 11:28:21 AM

## 2011-08-15 ENCOUNTER — Encounter (INDEPENDENT_AMBULATORY_CARE_PROVIDER_SITE_OTHER): Payer: Self-pay | Admitting: Surgery

## 2011-08-16 ENCOUNTER — Encounter (INDEPENDENT_AMBULATORY_CARE_PROVIDER_SITE_OTHER): Payer: Self-pay | Admitting: Surgery

## 2011-08-16 ENCOUNTER — Ambulatory Visit (INDEPENDENT_AMBULATORY_CARE_PROVIDER_SITE_OTHER): Payer: BC Managed Care – PPO | Admitting: Surgery

## 2011-08-16 VITALS — BP 136/82 | HR 78

## 2011-08-16 DIAGNOSIS — L0591 Pilonidal cyst without abscess: Secondary | ICD-10-CM

## 2011-08-16 NOTE — Progress Notes (Signed)
Visit Diagnoses: 1. PILONIDAL CYST     HISTORY: Patient is a 30 year old male who underwent excision of pilonidal cyst. They are changing the packing to the wound twice daily. He is showering once daily.   EXAM: Dressing change is performed with the assistance of his significant other. She is instructed in proper dressing change technique using 4 x 4 gauze moistened with normal saline in a wet-to-dry fashion. Wound is clean and granulating. No sign of infection.   IMPRESSION: Status post pilonidal cystectomy, wound healing by secondary intention   PLAN: Dressing changes will be performed twice daily in the manner noted above. Patient will return in 3 weeks for wound check.   Velora Heckler, MD, FACS General & Endocrine Surgery [facility]

## 2011-08-16 NOTE — Patient Instructions (Signed)
Pack wound with 4x4 gauze wet to dry twice daily.  Shower once daily.

## 2011-08-17 ENCOUNTER — Other Ambulatory Visit (INDEPENDENT_AMBULATORY_CARE_PROVIDER_SITE_OTHER): Payer: Self-pay | Admitting: Surgery

## 2011-08-17 ENCOUNTER — Telehealth (INDEPENDENT_AMBULATORY_CARE_PROVIDER_SITE_OTHER): Payer: Self-pay

## 2011-08-17 MED ORDER — OXYCODONE-ACETAMINOPHEN 5-325 MG PO TABS: 1.0000 | ORAL_TABLET | ORAL | Status: DC | PRN

## 2011-08-17 NOTE — Telephone Encounter (Signed)
Pt to p/u Rx for Percocet 5/325 #30 okayed by Dr. Gerrit Friends and written by Dr. Daphine Deutscher.   Pt's wife instructed that this would be the last Rx for pain medication for her husband per Dr. Gerrit Friends.  She understood and agreed.

## 2011-08-23 ENCOUNTER — Telehealth (INDEPENDENT_AMBULATORY_CARE_PROVIDER_SITE_OTHER): Payer: Self-pay | Admitting: General Surgery

## 2011-08-23 NOTE — Telephone Encounter (Signed)
PT'S WIFE CALLED RE DRAINAGE NOTED ON GAUZE WHEN CHANGED THIS AM.  SHE STATED DRAINAGE HAD GREENISH TINT. NO DEFINITIVE ODOR. NO FEVER OR OTHER NEW SYMPTOMS NOTED/ DRESSING IS CHANGED TWICE A DAY AND PT IS TAKING SHOWER WITH PACKING REMOVED. DR. Gerrit Friends ADVISED  TO ADD 2 TABLESPOONS OF CLOROX TO LARGE BOTTLE OF NORMAL SALINE SOLUTION FOR PACKING CHANGE AND CALL IF SYMPTOMS HAVE WORSENED. KARYN ADVISED.

## 2011-08-25 NOTE — Progress Notes (Signed)
Encounter resolved.

## 2011-08-28 ENCOUNTER — Ambulatory Visit: Payer: BC Managed Care – PPO | Admitting: Internal Medicine

## 2011-09-05 ENCOUNTER — Ambulatory Visit (INDEPENDENT_AMBULATORY_CARE_PROVIDER_SITE_OTHER): Payer: BC Managed Care – PPO | Admitting: Internal Medicine

## 2011-09-05 VITALS — BP 130/72 | HR 78 | Temp 98.5°F | Wt 170.0 lb

## 2011-09-05 DIAGNOSIS — M545 Low back pain: Secondary | ICD-10-CM

## 2011-09-07 ENCOUNTER — Ambulatory Visit (INDEPENDENT_AMBULATORY_CARE_PROVIDER_SITE_OTHER): Payer: BC Managed Care – PPO | Admitting: Surgery

## 2011-09-07 ENCOUNTER — Encounter (INDEPENDENT_AMBULATORY_CARE_PROVIDER_SITE_OTHER): Payer: Self-pay | Admitting: Surgery

## 2011-09-07 VITALS — BP 134/86 | HR 70 | Temp 97.2°F | Resp 16 | Ht 73.0 in | Wt 167.4 lb

## 2011-09-07 DIAGNOSIS — L0591 Pilonidal cyst without abscess: Secondary | ICD-10-CM

## 2011-09-07 NOTE — Progress Notes (Signed)
Visit Diagnoses: 1. PILONIDAL CYST     HISTORY: Patient returns for postoperative wound check. They are doing wet to dry dressing changes twice daily. Patient did develop some green discoloration. They are adding a small amount of Clorox  to the saline and this has improved.   EXAM: Wound is clean and granulating throughout. There is no drainage. There is no odor.   IMPRESSION: Pilonidal cystectomy healing by secondary intention without complication   PLAN: Continue dressing changes once or twice daily. Return in 3-4 weeks for wound check.   Velora Heckler, MD, FACS General & Endocrine Surgery [facility]

## 2011-09-07 NOTE — Patient Instructions (Signed)
Continue dressing changes once or twice daily.  Add Clorox to saline as needed for green discoloration.  TMG

## 2011-10-09 ENCOUNTER — Encounter (INDEPENDENT_AMBULATORY_CARE_PROVIDER_SITE_OTHER): Payer: Self-pay | Admitting: Surgery

## 2011-10-09 ENCOUNTER — Ambulatory Visit (INDEPENDENT_AMBULATORY_CARE_PROVIDER_SITE_OTHER): Payer: BC Managed Care – PPO | Admitting: Surgery

## 2011-10-09 VITALS — BP 124/80 | HR 72 | Temp 97.8°F | Resp 12 | Ht 73.0 in | Wt 173.2 lb

## 2011-10-09 DIAGNOSIS — L0591 Pilonidal cyst without abscess: Secondary | ICD-10-CM

## 2011-10-09 NOTE — Patient Instructions (Signed)
Continue wet to dry dressing changes daily.  Shower daily.  TMG

## 2011-10-09 NOTE — Progress Notes (Signed)
Visit Diagnoses: 1. PILONIDAL CYST     HISTORY: Patient returns today for pilonidal cyst wound evaluation.   EXAM: The wound is clean and granulating throughout. No drainage. No odor. Wound is becoming more shallow but still requires a wet to dry dressing change at this point   IMPRESSION: Pilonidal cystectomy wound healing by secondary intention without complication   PLAN: Patient will continue wet to dry dressing changes daily. He will return to see me in approximately 2 weeks for a wound check. At that time if the wound is more superficial I think a wet to dry dressing changes can be discontinued and we will use a topical antibiotic ointment 2-3 times daily.   Velora Heckler, MD, FACS General & Endocrine Surgery [facility]

## 2011-10-17 ENCOUNTER — Other Ambulatory Visit: Payer: Self-pay | Admitting: Internal Medicine

## 2011-10-25 ENCOUNTER — Encounter (INDEPENDENT_AMBULATORY_CARE_PROVIDER_SITE_OTHER): Payer: Self-pay | Admitting: Surgery

## 2011-10-25 ENCOUNTER — Ambulatory Visit (INDEPENDENT_AMBULATORY_CARE_PROVIDER_SITE_OTHER): Payer: BC Managed Care – PPO | Admitting: Surgery

## 2011-10-25 VITALS — BP 118/76 | HR 68 | Temp 97.1°F | Resp 14 | Ht 73.0 in | Wt 171.6 lb

## 2011-10-25 DIAGNOSIS — L0591 Pilonidal cyst without abscess: Secondary | ICD-10-CM

## 2011-10-25 NOTE — Patient Instructions (Signed)
Continue wet to dry dressing changes once or twice daily.  tmg

## 2011-10-25 NOTE — Progress Notes (Signed)
Visit Diagnoses: 1. PILONIDAL CYST     HISTORY: Patient returns for wound check following pilonidal cystectomy.  EXAM: Wound is clean and granulating. There is epithelialization growth around the edges. There is no odor. There is no sign of infection.  IMPRESSION: Pilonidal cyst wound healing by secondary intention  PLAN: Continue once or twice daily wet to dry dressing changes. Return in 3 weeks for wound check.   Velora Heckler, MD, FACS General & Endocrine Surgery [facility]

## 2011-11-14 ENCOUNTER — Encounter (INDEPENDENT_AMBULATORY_CARE_PROVIDER_SITE_OTHER): Payer: Self-pay | Admitting: Surgery

## 2011-11-14 ENCOUNTER — Ambulatory Visit (INDEPENDENT_AMBULATORY_CARE_PROVIDER_SITE_OTHER): Payer: BC Managed Care – PPO | Admitting: Surgery

## 2011-11-14 VITALS — BP 122/82 | HR 64 | Temp 97.6°F | Resp 16 | Ht 73.0 in | Wt 168.5 lb

## 2011-11-14 DIAGNOSIS — L0591 Pilonidal cyst without abscess: Secondary | ICD-10-CM

## 2011-11-14 NOTE — Patient Instructions (Signed)
Continue wet to dry dressing changes. tmg

## 2011-11-14 NOTE — Progress Notes (Signed)
Visit Diagnoses: 1. PILONIDAL CYST     HISTORY: Patient presents for wound check of pilonidal cystectomy wound. Twice daily dressing changes are being performed wet to dry.  EXAM: Wound is clean and granulating. It measures approximately 2.5 x 1.5 x 1 cm in size. There is no sign of infection. Skin is encroaching circumferentially.  IMPRESSION: Pilonidal cystectomy wound healing by secondary intention  PLAN: Continue twice daily wet to dry dressing changes and daily showers. Patient will return in 4 weeks for wound check.   Velora Heckler, MD, FACS General & Endocrine Surgery [facility]

## 2011-12-18 ENCOUNTER — Encounter (INDEPENDENT_AMBULATORY_CARE_PROVIDER_SITE_OTHER): Payer: BC Managed Care – PPO | Admitting: Surgery

## 2012-01-08 ENCOUNTER — Ambulatory Visit (INDEPENDENT_AMBULATORY_CARE_PROVIDER_SITE_OTHER): Payer: BC Managed Care – PPO | Admitting: Surgery

## 2012-01-08 ENCOUNTER — Encounter (INDEPENDENT_AMBULATORY_CARE_PROVIDER_SITE_OTHER): Payer: Self-pay | Admitting: Surgery

## 2012-01-08 VITALS — BP 108/72 | HR 100 | Temp 97.9°F | Resp 18 | Ht 73.0 in | Wt 167.8 lb

## 2012-01-08 DIAGNOSIS — L0591 Pilonidal cyst without abscess: Secondary | ICD-10-CM

## 2012-01-08 NOTE — Progress Notes (Signed)
Visit Diagnoses: 1. PILONIDAL CYST     HISTORY: Patient returns for wound check having undergone excision of pilonidal cyst to 4 months ago  EXAM: Wound in the natal cleft is now punctate. It measures approximately 3 mm in diameter. It measures approximately 5 mm in depth. There is no drainage. There is no sign of cellulitis or infection.  IMPRESSION: Pilonidal cyst wound healing by secondary intention   PLAN: Instructions for wound care given to the patient and his significant other. I think the packing can be discontinued. We will be cleansed with hydrogen peroxide and topical antibiotic ointment applied twice daily. Once the wound has completely re\re epithelialized we will discontinue the topical antibiotic ointment and hydrogen peroxide and use only cocoa butter. Patient will return in 4 weeks for final wound check.  Velora Heckler, MD, FACS General & Endocrine Surgery [facility]

## 2012-01-08 NOTE — Patient Instructions (Signed)
Cleanse wound with hydrogen peroxide twice daily and apply triple antibiotic ointment. Cover with dry gauze. When wound closed, stop ointment and peroxide and apply cocoa butter cream only. tmg

## 2012-02-21 ENCOUNTER — Encounter (INDEPENDENT_AMBULATORY_CARE_PROVIDER_SITE_OTHER): Payer: Self-pay | Admitting: Surgery

## 2012-02-21 ENCOUNTER — Ambulatory Visit (INDEPENDENT_AMBULATORY_CARE_PROVIDER_SITE_OTHER): Payer: BC Managed Care – PPO | Admitting: Surgery

## 2012-02-21 VITALS — BP 120/72 | HR 104 | Temp 98.6°F | Resp 16 | Ht 73.0 in | Wt 174.0 lb

## 2012-02-21 DIAGNOSIS — L0591 Pilonidal cyst without abscess: Secondary | ICD-10-CM

## 2012-02-21 NOTE — Progress Notes (Signed)
Visit Diagnoses: 1. PILONIDAL CYST     HISTORY: Patient is a 31 year old male who underwent pilonidal cystectomy many months ago. He has been slow to heal. He presents today for wound check.  EXAM: Wound is now essentially healed with a very superficial area yet to re-epithelialize.  IMPRESSION: Nearly healed pilonidal cyst wound  PLAN: Patient will apply topical Neosporin for one additional week. He will then switched to cocoa butter cream for 6 weeks. After that he will discontinue all application of creams to the incision. At this point he is released to full activity. He will return as needed.  Velora Heckler, MD, FACS General & Endocrine Surgery [facility]

## 2012-02-21 NOTE — Patient Instructions (Signed)
Neosporin to wound daily for one week.  Then switch to cocoa butter cream for 6 weeks.  tmg

## 2012-03-28 ENCOUNTER — Ambulatory Visit (INDEPENDENT_AMBULATORY_CARE_PROVIDER_SITE_OTHER): Payer: BC Managed Care – PPO | Admitting: Endocrinology

## 2012-03-28 ENCOUNTER — Encounter: Payer: Self-pay | Admitting: Endocrinology

## 2012-03-28 VITALS — BP 120/84 | HR 84 | Temp 98.3°F | Ht 73.0 in | Wt 180.0 lb

## 2012-03-28 DIAGNOSIS — M545 Low back pain, unspecified: Secondary | ICD-10-CM

## 2012-03-28 NOTE — Progress Notes (Signed)
  Subjective:    Patient ID: [name], male    DOB: 12/21/1980, 31 y.o.   MRN: [id number]  HPI Pt has long h/o low-back pain.  There was a gap in PT, due to pilonidal cyst.  He has since gone back to PT.  He now has few weeks of moderate pain at the upper back.  He has assoc spasms, but no assoc numbness (but no change in chronic numbness of the left heel).  He was rx'ed zanaflex yesterday by neurosurg.   Past Medical History  Diagnosis Date  . Tension headache   . Anxiety and depression   . Alcohol abuse   . Skull fracture 2004    Trauma  . Pilonidal cyst     Past Surgical History  Procedure Date  . Vasectomy 04/2010  . Pilonidal cystectomy 08/01/2011    History   Social History  . Marital Status: Married    Spouse Name: N/A    Number of Children: N/A  . Years of Education: N/A   Occupational History  . Student    Social History Main Topics  . Smoking status: Former Smoker    Types: Cigarettes  . Smokeless tobacco: Never Used   Comment: quit 2 or more years  . Alcohol Use: No  . Drug Use: No  . Sexually Active: Not on file   Other Topics Concern  . Not on file   Social History Narrative   ** Merged History Encounter ** HSG, attending [employer] (09/2010)Married June 2011No childrenNO history of physical or sexual abuse    Current Outpatient Prescriptions on File Prior to Visit  Medication Sig Dispense Refill  . fluticasone (FLONASE) 50 MCG/ACT nasal spray Place 2 sprays into the nose daily.      . meloxicam (MOBIC) 15 MG tablet TAKE 1 TABLET BY MOUTH DAILY  30 tablet  5    Allergies  Allergen Reactions  . Avelox (Moxifloxacin Hcl In Nacl) Nausea And Vomiting  . Fluoxetine     Intolerant of all SSRI's, NE drugs  . Moxifloxacin Nausea And Vomiting    Family History  Problem Relation Age of Onset  . Depression Mother   . Anxiety disorder Mother   . Alcohol abuse Mother   . Hypertension Father   . Hyperlipidemia Father   . Coronary artery disease Neg Hx     . Prostate cancer Neg Hx   . Colon cancer Neg Hx   . Diabetes Neg Hx   . Cancer Paternal Aunt     breast    BP 120/84  Pulse 84  Temp(Src) 98.3 F (36.8 C) (Oral)  Ht 6\' 1"  (1.854 m)  Wt 180 lb (81.647 kg)  BMI 23.75 kg/m2  SpO2 97%  Review of Systems Denies bowel or bladder retention    Objective:   Physical Exam VITAL SIGNS:  See vs page GENERAL: no distress LUNGS:  Clear to auscultation Upper back: nontender Shoulders:  Abduction is limited to 90 degrees by pain. Gait: normal and steady.    Assessment & Plan:  Chronic pain syndrome, recurrent

## 2012-03-28 NOTE — Patient Instructions (Signed)
Here are samples of 2 kinds of patches:  "lidoderm," and "flector."  Put 1 on your left upper back daily, and 1 on the right upper back daily.   Please call us and tell us which works better, so we can prescribe for you.

## 2012-03-29 ENCOUNTER — Other Ambulatory Visit: Payer: Self-pay | Admitting: Internal Medicine

## 2012-03-29 NOTE — Telephone Encounter (Signed)
OK  Add to med list: flector patch 1.3%, apply bid to point of pain, #60, refill x 5

## 2012-03-29 NOTE — Telephone Encounter (Signed)
Pt requesting flector patch--pt pph# [phone]--[facility] [address]

## 2012-04-01 ENCOUNTER — Telehealth: Payer: Self-pay

## 2012-04-01 NOTE — Telephone Encounter (Signed)
[facility] Triage Call Report Triage Record Num: [id number] Operator: Lodema Pilot Patient Name: [name] Call Date & Time: 03/30/2012 9:43:43AM Patient Phone: [phone] PCP: Illene Regulus Patient Gender: Male PCP Fax : [phone] Patient DOB: March 24, 1981 Practice Name: [facility] Reason for Call: Caller: Lacinda Axon; PCP: Illene Regulus; CB#: [phone]; Call regarding Medication. Was at office on Thurs 03/28/12. Dr. Everardo All RX 2 Patches for upper back pain. Pt was to choose which patch helped better, and call back to receive RX. Pt prefers TEFL teacher. Pt had called 03/29/12, but has not heard back from office. Per Epic, Flector Patch 1.3% BID to point of pain #60, Refills x 5 - ordered by Dr. Illene Regulus. Called to [facility] on [address] at [phone], and spoke with Johnna, Rph. RX will be available w/in 30 min. Protocol(s) Used: Office Note Recommended Outcome per Protocol: Information Noted and Sent to Office Reason for Outcome: Caller information to office Care Advice: ~ 03/30/2012 9:59:15AM Page 1 of 1 [email or web address]

## 2012-04-03 ENCOUNTER — Telehealth: Payer: Self-pay | Admitting: *Deleted

## 2012-04-03 MED ORDER — DICLOFENAC EPOLAMINE 1.3 % TD PTCH: 1.0000 | MEDICATED_PATCH | Freq: Two times a day (BID) | TRANSDERMAL | Status: DC

## 2012-04-03 NOTE — Telephone Encounter (Signed)
Spoke with pt. Wife. PA approved  For flector 1.3% patch.

## 2012-04-03 NOTE — Telephone Encounter (Signed)
Rx for Flector Patch sent to pharmacy, pt informed. Pt needs prior authorization for patch per pt. Please contact wife [place] per pt [phone].

## 2012-04-03 NOTE — Telephone Encounter (Signed)
Patient wife notified PA approval for flecto patch was sent to pharmacy. Fannie Knee

## 2012-06-17 ENCOUNTER — Other Ambulatory Visit: Payer: BC Managed Care – PPO

## 2012-06-17 ENCOUNTER — Encounter: Payer: Self-pay | Admitting: Internal Medicine

## 2012-06-17 ENCOUNTER — Ambulatory Visit (INDEPENDENT_AMBULATORY_CARE_PROVIDER_SITE_OTHER)
Admission: RE | Admit: 2012-06-17 | Discharge: 2012-06-17 | Disposition: A | Discharge status: Home / Self Care | Payer: BC Managed Care – PPO | Source: Ambulatory Visit | Attending: Internal Medicine | Admitting: Internal Medicine

## 2012-06-17 ENCOUNTER — Ambulatory Visit (INDEPENDENT_AMBULATORY_CARE_PROVIDER_SITE_OTHER): Payer: BC Managed Care – PPO | Admitting: Internal Medicine

## 2012-06-17 VITALS — BP 114/80 | HR 102 | Temp 98.1°F | Resp 16 | Ht 73.0 in | Wt 186.0 lb

## 2012-06-17 DIAGNOSIS — M546 Pain in thoracic spine: Secondary | ICD-10-CM

## 2012-06-17 DIAGNOSIS — L0591 Pilonidal cyst without abscess: Secondary | ICD-10-CM

## 2012-06-17 DIAGNOSIS — M545 Low back pain: Secondary | ICD-10-CM

## 2012-06-17 DIAGNOSIS — F341 Dysthymic disorder: Secondary | ICD-10-CM

## 2012-06-17 NOTE — Assessment & Plan Note (Signed)
Followed by Mr. Shugart, PA and is stable on his present medications. He will obtain refills as needed from psychiatry.

## 2012-06-17 NOTE — Assessment & Plan Note (Signed)
Surgically treated and resolved.

## 2012-06-17 NOTE — Progress Notes (Signed)
  Subjective:    Patient ID: [name], male    DOB: 1981-07-31, 31 y.o.   MRN: [id number]  HPI [name] presents for follow-up. He was last seen in April '12. In the interval he has had ORIF and repair of laceration right 3rd distal tuft; pilonidal surgery Dr. Gerrit Friends and he was under the care of Dr. Ollen Bowl for pain management. He was at [facility] PT and is now being seen at [facility] for low back problems. He has been c/o upper back pain on occasion but denies any weakness or paresthesia UEs.   Past Medical History  Diagnosis Date  . Tension headache   . Anxiety and depression   . Alcohol abuse   . Skull fracture 2004    Trauma  . Pilonidal cyst    Past Surgical History  Procedure Date  . Vasectomy 04/2010  . Pilonidal cystectomy 08/01/2011   Family History  Problem Relation Age of Onset  . Depression Mother   . Anxiety disorder Mother   . Alcohol abuse Mother   . Hypertension Father   . Hyperlipidemia Father   . Coronary artery disease Neg Hx   . Prostate cancer Neg Hx   . Colon cancer Neg Hx   . Diabetes Neg Hx   . Cancer Paternal Aunt     breast   History   Social History  . Marital Status: Married    Spouse Name: N/A    Number of Children: N/A  . Years of Education: N/A   Occupational History  . Student    Social History Main Topics  . Smoking status: Former Smoker    Types: Cigarettes  . Smokeless tobacco: Never Used   Comment: quit 2 or more years  . Alcohol Use: No  . Drug Use: No  . Sexually Active: Not on file   Other Topics Concern  . Not on file   Social History Narrative   ** Merged History Encounter ** HSG, attending [employer] (09/2010)Married June 2011No childrenNO history of physical or sexual abuse      Review of Systems System review is negative for any constitutional, cardiac, pulmonary, GI or neuro symptoms or complaints other than as described in the HPI.     Objective:   Physical Exam Filed Vitals:   06/17/12 1010  BP:  114/80  Pulse: 102  Temp: 98.1 F (36.7 C)  Resp: 16   Gen'l- WNWD white man in no distress HEENT- [place]/AT, C&S clear Cor- 2+ radial, RRR Pulm - normal respirations and breath sounds MSK - normal ROM UE Neuro - normal MS grip, proximal and distal UE, normal gait      Assessment & Plan:  Thoracic back pain - negative exam for any radicular symptoms  Plan -  Thoracic spine films-2 views.

## 2012-06-17 NOTE — Assessment & Plan Note (Signed)
Long term chronic problem with no surgical problem. He has been seen by a variety of specialists including Dr. Ollen Bowl at [facility]. He is relatively functional: walks w/o assist or use of device, has good strength.  Plan - Will continue present regimen of ultram and flexeril

## 2012-06-24 ENCOUNTER — Encounter: Payer: Self-pay | Admitting: Internal Medicine

## 2012-07-15 ENCOUNTER — Other Ambulatory Visit: Payer: Self-pay

## 2012-07-15 DIAGNOSIS — M546 Pain in thoracic spine: Secondary | ICD-10-CM

## 2012-07-15 MED ORDER — CYCLOBENZAPRINE HCL 10 MG PO TABS: 10.0000 mg | ORAL_TABLET | Freq: Three times a day (TID) | ORAL | Status: DC

## 2012-07-18 ENCOUNTER — Encounter: Payer: Self-pay | Admitting: Internal Medicine

## 2012-07-18 ENCOUNTER — Ambulatory Visit (INDEPENDENT_AMBULATORY_CARE_PROVIDER_SITE_OTHER): Payer: BC Managed Care – PPO | Admitting: Internal Medicine

## 2012-07-18 ENCOUNTER — Other Ambulatory Visit (INDEPENDENT_AMBULATORY_CARE_PROVIDER_SITE_OTHER): Payer: BC Managed Care – PPO

## 2012-07-18 VITALS — BP 110/84 | HR 125 | Temp 97.6°F | Resp 16 | Wt 184.0 lb

## 2012-07-18 DIAGNOSIS — R209 Unspecified disturbances of skin sensation: Secondary | ICD-10-CM

## 2012-07-18 DIAGNOSIS — F1021 Alcohol dependence, in remission: Secondary | ICD-10-CM

## 2012-07-18 DIAGNOSIS — R202 Paresthesia of skin: Secondary | ICD-10-CM

## 2012-07-18 DIAGNOSIS — Z0000 Encounter for general adult medical examination without abnormal findings: Secondary | ICD-10-CM

## 2012-07-18 DIAGNOSIS — F341 Dysthymic disorder: Secondary | ICD-10-CM

## 2012-07-18 LAB — CBC WITH DIFFERENTIAL/PLATELET
Basophils Absolute: 0 10*3/uL (ref 0.0–0.1)
HCT: 45.2 % (ref 39.0–52.0)
Lymphs Abs: 2.7 10*3/uL (ref 0.7–4.0)
Monocytes Relative: 9.6 % (ref 3.0–12.0)
Neutrophils Relative %: 55.1 % (ref 43.0–77.0)
Platelets: 327 10*3/uL (ref 150.0–400.0)
RDW: 13.3 % (ref 11.5–14.6)

## 2012-07-18 LAB — COMPREHENSIVE METABOLIC PANEL
Albumin: 4.6 g/dL (ref 3.5–5.2)
Alkaline Phosphatase: 70 U/L (ref 39–117)
BUN: 11 mg/dL (ref 6–23)
Calcium: 10.1 mg/dL (ref 8.4–10.5)
Chloride: 106 mEq/L (ref 96–112)
Glucose, Bld: 96 mg/dL (ref 70–99)
Potassium: 4.6 mEq/L (ref 3.5–5.1)

## 2012-07-18 LAB — LIPID PANEL
Cholesterol: 197 mg/dL (ref 0–200)
Triglycerides: 189 mg/dL — ABNORMAL HIGH (ref 0.0–149.0)
VLDL: 37.8 mg/dL (ref 0.0–40.0)

## 2012-07-18 LAB — HEPATIC FUNCTION PANEL
Bilirubin, Direct: 0 mg/dL (ref 0.0–0.3)
Total Bilirubin: 0.2 mg/dL — ABNORMAL LOW (ref 0.3–1.2)

## 2012-07-18 NOTE — Progress Notes (Signed)
Subjective:    Patient ID: [name], male    DOB: 10/07/1981, 31 y.o.   MRN: [id number]  HPI Mr. Hasler presents with a request for routine lab work - last done in October '11. In particular he is concerned about any changes possibly wrought by his medications.  He has continued to struggle with depression and anxiety. He has tried all classes of antidepressants and has tolerance problems.  He is c/o paresthesia at the lateral aspect of the heel left foot and right. This is mild but worrisome to him.   Past Medical History  Diagnosis Date  . Tension headache   . Anxiety and depression   . Alcohol abuse   . Skull fracture 2004    Trauma  . Pilonidal cyst    Past Surgical History  Procedure Date  . Vasectomy 04/2010  . Pilonidal cystectomy 08/01/2011  . Orif right 3rd finger 2012    Dr. Merlyn Lot - see hospital notes.   Family History  Problem Relation Age of Onset  . Depression Mother   . Anxiety disorder Mother   . Alcohol abuse Mother   . Hypertension Father   . Hyperlipidemia Father   . Coronary artery disease Neg Hx   . Prostate cancer Neg Hx   . Colon cancer Neg Hx   . Diabetes Neg Hx   . Cancer Paternal Aunt     breast   History   Social History  . Marital Status: Married    Spouse Name: N/A    Number of Children: 0  . Years of Education: 12   Occupational History  . Student    Social History Main Topics  . Smoking status: Former Smoker    Types: Cigarettes  . Smokeless tobacco: Never Used   Comment: quit 2 or more years  . Alcohol Use: No  . Drug Use: No  . Sexually Active: Yes -- Male partner(s)   Other Topics Concern  . Not on file   Social History Narrative   HSG, attending [employer] (09/2010). Married June 2011.No children. NO history of physical or sexual abuse. Not working at this time.     Current Outpatient Prescriptions on File Prior to Visit  Medication Sig Dispense Refill  . clonazePAM (KLONOPIN) 0.5 MG tablet Take 0.5 mg by mouth 2  (two) times daily as needed.       . cyclobenzaprine (FLEXERIL) 10 MG tablet Take 1 tablet (10 mg total) by mouth 3 (three) times daily.  90 tablet  1  . diclofenac (FLECTOR) 1.3 % PTCH Place 1 patch onto the skin 2 (two) times daily. to point of pain  60 patch  5  . fluticasone (FLONASE) 50 MCG/ACT nasal spray Place 2 sprays into the nose daily.      . traMADol (ULTRAM-ER) 100 MG 24 hr tablet Take 100 mg by mouth every 6 (six) hours as needed.          Review of Systems System review is negative for any constitutional, cardiac, pulmonary, GI or neuro symptoms or complaints other than as described in the HPI.     Objective:   Physical Exam Filed Vitals:   07/18/12 1315  BP: 110/84  Pulse: 125  Temp: 97.6 F (36.4 C)  Resp: 16   Wt Readings from Last 3 Encounters:  07/18/12 184 lb (83.462 kg)  06/17/12 186 lb (84.369 kg)  03/28/12 180 lb (81.647 kg)   Gen'l- WNWD slender white man in no distress Cor- RRR Pulm -  normal respirations Neuro - A&O x 3, cerebellar - normal gait, sensation - decrease to light touch and pin-prick lateral aspect at the edge of the foot.      Assessment & Plan:

## 2012-07-18 NOTE — Assessment & Plan Note (Signed)
Persistent and progressive problem. Many drug intolerances  Plan -  Refer to Dr. Evelene Croon for psychiatric consult.

## 2012-07-18 NOTE — Assessment & Plan Note (Signed)
Continued problem but relatively restricted in distribution to the lateral heels and moderate in severity. No limitations in activity.  Plan -  Lab: B12, basic chemistries  Watchful waiting  For increased paresthesis NCS

## 2012-07-19 ENCOUNTER — Encounter: Payer: Self-pay | Admitting: Internal Medicine

## 2012-08-09 ENCOUNTER — Other Ambulatory Visit: Payer: Self-pay | Admitting: *Deleted

## 2012-08-09 DIAGNOSIS — M546 Pain in thoracic spine: Secondary | ICD-10-CM

## 2012-08-09 MED ORDER — CYCLOBENZAPRINE HCL 10 MG PO TABS: 10.0000 mg | ORAL_TABLET | Freq: Three times a day (TID) | ORAL | Status: DC

## 2012-08-29 ENCOUNTER — Other Ambulatory Visit: Payer: Self-pay | Admitting: *Deleted

## 2012-08-29 MED ORDER — TRAMADOL HCL ER 100 MG PO TB24
100.0000 mg | ORAL_TABLET | Freq: Four times a day (QID) | ORAL | Status: DC | PRN
Start: 2012-08-29 — End: 2012-12-17

## 2012-09-11 ENCOUNTER — Telehealth: Payer: Self-pay | Admitting: *Deleted

## 2012-09-11 MED ORDER — TRAMADOL HCL ER 100 MG PO TB24: 100.0000 mg | ORAL_TABLET | Freq: Three times a day (TID) | ORAL | Status: DC | PRN

## 2012-09-11 NOTE — Telephone Encounter (Signed)
Ok to change to tid

## 2012-09-11 NOTE — Telephone Encounter (Signed)
Medication changed tramadol to tid.

## 2012-09-11 NOTE — Telephone Encounter (Signed)
Patient Rx for tramadol 100mg  for Q6 hours requires a PA. If ordered 3 x day insurance will cover. Please advise

## 2012-09-19 ENCOUNTER — Ambulatory Visit: Payer: Self-pay | Admitting: Internal Medicine

## 2012-09-21 ENCOUNTER — Emergency Department (HOSPITAL_COMMUNITY)
Admission: EM | Admit: 2012-09-21 | Discharge: 2012-09-21 | Disposition: A | Discharge status: Home / Self Care | Payer: BC Managed Care – PPO | Attending: Emergency Medicine | Admitting: Emergency Medicine

## 2012-09-21 ENCOUNTER — Encounter (HOSPITAL_COMMUNITY): Payer: Self-pay | Admitting: Emergency Medicine

## 2012-09-21 DIAGNOSIS — Z8781 Personal history of (healed) traumatic fracture: Secondary | ICD-10-CM | POA: Insufficient documentation

## 2012-09-21 DIAGNOSIS — M549 Dorsalgia, unspecified: Secondary | ICD-10-CM

## 2012-09-21 DIAGNOSIS — F411 Generalized anxiety disorder: Secondary | ICD-10-CM | POA: Insufficient documentation

## 2012-09-21 DIAGNOSIS — F1010 Alcohol abuse, uncomplicated: Secondary | ICD-10-CM | POA: Insufficient documentation

## 2012-09-21 DIAGNOSIS — F329 Major depressive disorder, single episode, unspecified: Secondary | ICD-10-CM | POA: Insufficient documentation

## 2012-09-21 DIAGNOSIS — Z87891 Personal history of nicotine dependence: Secondary | ICD-10-CM | POA: Insufficient documentation

## 2012-09-21 DIAGNOSIS — Z888 Allergy status to other drugs, medicaments and biological substances status: Secondary | ICD-10-CM | POA: Insufficient documentation

## 2012-09-21 DIAGNOSIS — F3289 Other specified depressive episodes: Secondary | ICD-10-CM | POA: Insufficient documentation

## 2012-09-21 DIAGNOSIS — G8929 Other chronic pain: Secondary | ICD-10-CM | POA: Insufficient documentation

## 2012-09-21 MED ORDER — HYDROCODONE-ACETAMINOPHEN 5-325 MG PO TABS: ORAL_TABLET | ORAL | Status: DC

## 2012-09-21 NOTE — ED Provider Notes (Signed)
History     CSN: [id number]  Arrival date & time 09/21/12  0750   First MD Initiated Contact with Patient 09/21/12 [phone]      Chief Complaint  Patient presents with  . Back Pain    (Consider location/radiation/quality/duration/timing/severity/associated sxs/prior treatment) HPI Comments: Patients with history of chronic back pain (for past 3 years) presents with flare of his usual back pain. Patient awoke at 0530 with back pain that was worse than usual. He took a tramadol without relief. Patient also takes Flexeril. Patient states that the pain is in his lower back and radiates into his legs bilaterally. This is typical for him. He has had an MRI in the past which shows "vertebrae moving and fracturing into each other". Patient also complains of middle back pain bilaterally that radiates into his arms and into his last 2 fingers bilaterally. Patient states this is typical for him and that this "comes and goes". Patient has been doing physical therapy for the past 2 years and states that it isn't really helping. Patient has been seen by Dr. August Saucer in the past and has an appointment scheduled in 6 days. Patient denies red flag signs and symptoms of lower back pain. He denies new weakness or tingling. He denies new injury. Onset insidious. Course is constant. Palpation and movement makes symptoms worse.   The history is provided by the patient.    Past Medical History  Diagnosis Date  . Tension headache   . Anxiety and depression   . Alcohol abuse   . Skull fracture 2004    Trauma  . Pilonidal cyst     Past Surgical History  Procedure Date  . Vasectomy 04/2010  . Pilonidal cystectomy 08/01/2011  . Orif right 3rd finger 2012    Dr. Merlyn Lot - see hospital notes.    Family History  Problem Relation Age of Onset  . Depression Mother   . Anxiety disorder Mother   . Alcohol abuse Mother   . Hypertension Father   . Hyperlipidemia Father   . Coronary artery disease Neg Hx   . Prostate  cancer Neg Hx   . Colon cancer Neg Hx   . Diabetes Neg Hx   . Cancer Paternal Aunt     breast    History  Substance Use Topics  . Smoking status: Former Smoker    Types: Cigarettes  . Smokeless tobacco: Never Used   Comment: quit 2 or more years  . Alcohol Use: No      Review of Systems  Constitutional: Negative for fever and unexpected weight change.  Gastrointestinal: Negative for constipation.       Neg for fecal incontinence  Genitourinary: Negative for hematuria, flank pain and difficulty urinating.       Negative for urinary incontinence or retention  Musculoskeletal: Positive for back pain.  Neurological: Positive for numbness (paresthesias in upper extremities bilaterally). Negative for weakness.       Negative for saddle paresthesias     Allergies  Avelox; Fluoxetine; and Moxifloxacin  Home Medications   Current Outpatient Rx  Name Route Sig Dispense Refill  . CLONAZEPAM 0.5 MG PO TABS Oral Take 0.5 mg by mouth 2 (two) times daily as needed.     . CYCLOBENZAPRINE HCL 10 MG PO TABS Oral Take 1 tablet (10 mg total) by mouth 3 (three) times daily. 90 tablet 3  . DICLOFENAC EPOLAMINE 1.3 % TD PTCH Transdermal Place 1 patch onto the skin 2 (two) times daily. to point  of pain 60 patch 5  . FLUTICASONE PROPIONATE 50 MCG/ACT NA SUSP Nasal Place 2 sprays into the nose daily.    Marland Kitchen HYDROCODONE-ACETAMINOPHEN 5-325 MG PO TABS  Take 1-2 tablets every 6 hours as needed for severe pain 12 tablet 0  . TRAMADOL HCL ER 100 MG PO TB24 Oral Take 1 tablet (100 mg total) by mouth every 6 (six) hours as needed. 120 tablet 3  . TRAMADOL HCL ER 100 MG PO TB24 Oral Take 1 tablet (100 mg total) by mouth 3 (three) times daily as needed. 90 tablet 5    BP 120/75  Pulse 98  Temp 97.9 F (36.6 C) (Oral)  SpO2 99%  Physical Exam  Nursing note and vitals reviewed. Constitutional: He appears well-developed and well-nourished.  HENT:  Head: Normocephalic and atraumatic.  Eyes:  Conjunctivae normal are normal.  Neck: Normal range of motion.  Abdominal: Soft. There is no tenderness. There is no CVA tenderness.  Musculoskeletal: Normal range of motion. He exhibits no tenderness.       Cervical back: He exhibits normal range of motion, no tenderness and no bony tenderness.       Thoracic back: He exhibits tenderness. He exhibits normal range of motion and no bony tenderness.       Lumbar back: He exhibits tenderness. He exhibits normal range of motion and no bony tenderness.       Back:       No step-off noted with palpation of spine.   Neurological: He is alert. He has normal reflexes. No sensory deficit. He exhibits normal muscle tone.       5/5 strength in entire lower extremities bilaterally.   Skin: Skin is warm and dry.  Psychiatric: He has a normal mood and affect.    ED Course  Procedures (including critical care time)  Labs Reviewed - No data to display No results found.   1. Back pain    8:28 AM Patient seen and examined.    Vital signs reviewed and are as follows: Filed Vitals:   09/21/12 0806  BP: 120/75  Pulse: 98  Temp: 97.9 F (36.6 C)   No red flag s/s of low back pain. Patient was counseled on back pain precautions and told to do activity as tolerated but do not lift, push, or pull heavy objects more than 10 pounds for the next week.  Patient counseled to use ice or heat on back for no longer than 15 minutes every hour.   Patient prescribed narcotic pain medicine and counseled on proper use of narcotic pain medications. Counseled not to combine this medication with others containing tylenol or with his chronic Ultram.    Urged patient not to drink alcohol, drive, or perform any other activities that requires focus while taking either of these medications.  Patient urged to follow-up with PCP if pain does not improve with treatment and rest or if pain becomes recurrent. Urged to return with worsening severe pain, loss of bowel or bladder  control, trouble walking.   The patient verbalizes understanding and agrees with the plan.  MDM  Patient with back pain. No neurological deficits. Patient is ambulatory. No warning symptoms of back pain including: loss of bowel or bladder control, night sweats, waking from sleep with back pain, unexplained fevers or weight loss, h/o cancer, IVDU, recent trauma. No concern for cauda equina, epidural abscess, or other serious cause of back pain. Conservative measures such as rest, ice/heat and pain medicine indicated with orthopedic  follow-up (already planned) if no improvement with conservative management.          [place], [place] 09/21/12 [phone]

## 2012-09-21 NOTE — ED Provider Notes (Signed)
Medical screening examination/treatment/procedure(s) were performed by non-physician practitioner and as supervising physician I was immediately available for consultation/collaboration.  Raeford Razor, MD 09/21/12 [phone]

## 2012-09-21 NOTE — ED Notes (Signed)
Pt w/ hx of chronic back pain, woke this morning w/ severe pain in lower back and up between shoulder blades. Pain radiates down both arms and outer aspect of hands are numb bilaterally. Pt is on Tramadol 100 ER that he took this am.

## 2012-10-07 ENCOUNTER — Encounter: Payer: Self-pay | Admitting: Internal Medicine

## 2012-10-07 ENCOUNTER — Ambulatory Visit (INDEPENDENT_AMBULATORY_CARE_PROVIDER_SITE_OTHER): Payer: BC Managed Care – PPO | Admitting: Internal Medicine

## 2012-10-07 VITALS — BP 118/80 | HR 95 | Temp 97.5°F | Resp 16 | Ht 73.0 in | Wt 186.0 lb

## 2012-10-07 DIAGNOSIS — M545 Low back pain, unspecified: Secondary | ICD-10-CM

## 2012-10-07 DIAGNOSIS — R52 Pain, unspecified: Secondary | ICD-10-CM

## 2012-10-07 DIAGNOSIS — Z5189 Encounter for other specified aftercare: Secondary | ICD-10-CM

## 2012-10-07 MED ORDER — HYDROCODONE-ACETAMINOPHEN 7.5-325 MG PO TABS: 1.0000 | ORAL_TABLET | Freq: Four times a day (QID) | ORAL | Status: DC | PRN

## 2012-10-07 NOTE — Progress Notes (Signed)
Subjective:    Patient ID: [name], male    DOB: [birth year], 31 y.o.   MRN: [id number]  HPI [name] has a history of chronic back pain and had been followed at [facility]. He most recently has been seeing Dr. August Saucer at [facility]. He was last seen October 25th with no new diagnosis at that time but he was scheduled for and did have MRI thoracic and Lumbar spine Friday, Nov 1st. This study was done at a non[facility] facility - no report available. The pain has been unrelenting since the 19th October. Yesterday he was seen at an urgent care on [address] and was given additional Norco 5/ 325. He has continued tramadol as well.   His psychiatrist has changed from klonipin to valium to help with muscle spasms.  Today he is here for pain management. He reports that Norco does relieve his pain and that tramadol, NSAIDs have been unsuccessful for controlling his discomfort.   Past Medical History  Diagnosis Date  . Tension headache   . Anxiety and depression   . Alcohol abuse   . Skull fracture 2004    Trauma  . Pilonidal cyst    Past Surgical History  Procedure Date  . Vasectomy 04/2010  . Pilonidal cystectomy 08/01/2011  . Orif right 3rd finger 2012    Dr. Merlyn Lot - see hospital notes.   Family History  Problem Relation Age of Onset  . Depression Mother   . Anxiety disorder Mother   . Alcohol abuse Mother   . Hypertension Father   . Hyperlipidemia Father   . Coronary artery disease Neg Hx   . Prostate cancer Neg Hx   . Colon cancer Neg Hx   . Diabetes Neg Hx   . Cancer Paternal Aunt     breast   History   Social History  . Marital Status: Married    Spouse Name: N/A    Number of Children: 0  . Years of Education: 12   Occupational History  . Student    Social History Main Topics  . Smoking status: Former Smoker    Types: Cigarettes  . Smokeless tobacco: Never Used     Comment: quit 2 or more years  . Alcohol Use: No  . Drug Use: No  . Sexually Active: Yes  -- Male partner(s)   Other Topics Concern  . Not on file   Social History Narrative   HSG, attending [employer] (09/2010). Married June 2011.No children. NO history of physical or sexual abuse. Not working at this time.     Current Outpatient Prescriptions on File Prior to Visit  Medication Sig Dispense Refill  . ARIPiprazole (ABILIFY) 2 MG tablet Take 2 mg by mouth daily.      . cyclobenzaprine (FLEXERIL) 10 MG tablet Take 1 tablet (10 mg total) by mouth 3 (three) times daily.  90 tablet  3  . diclofenac (FLECTOR) 1.3 % PTCH Place 1 patch onto the skin 2 (two) times daily. to point of pain  60 patch  5  . DULoxetine (CYMBALTA) 60 MG capsule Take 60 mg by mouth daily.      . fluticasone (FLONASE) 50 MCG/ACT nasal spray Place 2 sprays into the nose daily.      Marland Kitchen HYDROcodone-acetaminophen (NORCO/VICODIN) 5-325 MG per tablet Take 1-2 tablets every 6 hours as needed for severe pain  12 tablet  0  . traMADol (ULTRAM-ER) 100 MG 24 hr tablet Take 1 tablet (100 mg total) by mouth every  6 (six) hours as needed.  120 tablet  3  . clonazePAM (KLONOPIN) 0.5 MG tablet Take 0.5 mg by mouth 2 (two) times daily as needed.       . traMADol (ULTRAM-ER) 100 MG 24 hr tablet Take 1 tablet (100 mg total) by mouth 3 (three) times daily as needed.  90 tablet  5      Review of Systems System review is negative for any constitutional, cardiac, pulmonary, GI or neuro symptoms or complaints other than as described in the HPI.     Objective:   Physical Exam Filed Vitals:   10/07/12 1704  BP: 118/80  Pulse: 95  Temp: 97.5 F (36.4 C)  Resp: 16   Gen'l- WNWD young white man in no acute distress HEENT- PERRLA Cor- RRR Pulm - normal respirations. MSK- sits comfortably, able to stand w/o assistance, normal gait.       Assessment & Plan:

## 2012-10-07 NOTE — Patient Instructions (Addendum)
On going back pain, now thoracic as well. Being worked up and having treatment plan per Dr. August Saucer. However, we have agreed to and entered a controlled substance contract - happy to prescribe within the confines of the agreement.  Norco 7.5/325 three times a day. Continue the valium but stop the cyclopbenzaprine - a duplication of medications Discontinue the tramadol Please ask Dr. August Saucer to copy me on his progress notes and studies, i.e. MRI scan reports.

## 2012-10-09 DIAGNOSIS — R52 Pain, unspecified: Secondary | ICD-10-CM | POA: Insufficient documentation

## 2012-10-09 NOTE — Assessment & Plan Note (Signed)
Chronic back pain - has been evaluated by neurosurgery - no operative problems. He is now seeing Dr. August Saucer for orthopedics with MRI pending. He has had multiple ED and Urgent Care visits  Plan- per Dr. August Saucer

## 2012-10-09 NOTE — Assessment & Plan Note (Signed)
Chronic back pain. He has seen Neuro and is seeing ortho. He has been unable to work due to pain. Tramadol, NSAIDs have failed to relieve his discomfort.  Discussed pain management and use of narcotics. With a h/o alcohol abuse he is at high risk for addiction and is informed of this. The dangers of dependence, cognitive impairment and GI problems including constipation were discussed. Reviewed office policy: he signed a "Controlled Substance Contract" which his wife also reviewed. Will monitor the [place] Controlled substance registry  He is prescribed Norco 7.5/325 TID #90 with 2 refills.  ROV in 3 months - before he runs out of medication

## 2012-10-10 ENCOUNTER — Ambulatory Visit: Payer: Self-pay | Admitting: Internal Medicine

## 2012-12-10 HISTORY — PX: RADIOFREQUENCY ABLATION NERVES: SUR1070

## 2012-12-17 ENCOUNTER — Encounter: Payer: Self-pay | Admitting: Internal Medicine

## 2012-12-17 ENCOUNTER — Ambulatory Visit (INDEPENDENT_AMBULATORY_CARE_PROVIDER_SITE_OTHER): Payer: BC Managed Care – PPO | Admitting: Internal Medicine

## 2012-12-17 VITALS — BP 122/80 | HR 120 | Temp 99.6°F | Resp 12 | Wt 190.0 lb

## 2012-12-17 DIAGNOSIS — Z5189 Encounter for other specified aftercare: Secondary | ICD-10-CM

## 2012-12-17 DIAGNOSIS — R52 Pain, unspecified: Secondary | ICD-10-CM

## 2012-12-17 MED ORDER — FENTANYL 25 MCG/HR TD PT72: 1.0000 | MEDICATED_PATCH | TRANSDERMAL | Status: DC

## 2012-12-17 NOTE — Progress Notes (Signed)
  Subjective:    Patient ID: [name], male    DOB: 16-Sep-1981, 32 y.o.   MRN: [id number]  HPI Patient reports that he had a lumbar L4-5 nerve ablation procedure by Dr. Alvester Morin at [facility] orth[facility]. It hasn't really helped yet, but it is supposed to take about a month. He has continued with PT but due to lack of progress insurance coverage ended.  He is needing Rx for medical massage therapy, pool based water therapy and TENS unit for discogenic lumbar back pain: rehab.   PMH, FamHx and SocHx reviewed for any changes and relevance. Current Outpatient Prescriptions on File Prior to Visit  Medication Sig Dispense Refill  . ARIPiprazole (ABILIFY) 2 MG tablet Take 2 mg by mouth daily.      . diazepam (VALIUM) 10 MG tablet Take 10 mg by mouth every 6 (six) hours as needed.      . DULoxetine (CYMBALTA) 60 MG capsule Take 60 mg by mouth daily.      . fluticasone (FLONASE) 50 MCG/ACT nasal spray Place 2 sprays into the nose daily.      Marland Kitchen HYDROcodone-acetaminophen (NORCO) 7.5-325 MG per tablet Take 1 tablet by mouth every 6 (six) hours as needed for pain.  90 tablet  2      Review of Systems System review is negative for any constitutional, cardiac, pulmonary, GI or neuro symptoms or complaints other than as described in the HPI.     Objective:   Physical Exam Filed Vitals:   12/17/12 1311  BP: 122/80  Pulse: 120  Temp: 99.6 F (37.6 C)  Resp: 12   Gen'l- WNWD white man using a cane HEENT - PERRLA, C&S clear Cor RRR Pulm - normal respirations Neuro - A&O x 3, Speech clear, not somnolent, normal gait.       Assessment & Plan:

## 2012-12-17 NOTE — Patient Instructions (Addendum)
Chronic pain management - better to have long acting medications to avoid the ups and downs of control. With long acting medication we may get by with less medication and you may overall do better.  Plan Medical massage therapy  Pool based exercise therapy  TENS unit for lumbar spine  Fentanyl 25 mcg patch applied every 72 hours  Continue hydrocodone/APAP for breakthrough pain  Reassess where we are at in 1 month.

## 2012-12-18 NOTE — Assessment & Plan Note (Signed)
Continued pain issues low and mid back. Reviewed available note from [facility] but no notes available from orthopedics where Dr. Alvester Morin has done nerve root ablation back. Patient brought in detailed pain diary for several weeks (scanned - media). Discussed advantage of long acting medications for better pain management.  Plan Non-drug therapy prescribed: TENS unit, water exercise, medical massage therapy  Long acting medication - Fentanyl patch 25 mcg q 72 hrs #10. To report back on control with potential to increase dose as needed  Continue hydrocodone/APAP for break through pain  Reminded him of terms of pain contract.

## 2012-12-23 ENCOUNTER — Encounter: Payer: Self-pay | Admitting: Internal Medicine

## 2012-12-23 ENCOUNTER — Other Ambulatory Visit: Payer: Self-pay | Admitting: *Deleted

## 2012-12-23 MED ORDER — FENTANYL 25 MCG/HR TD PT72: 1.0000 | MEDICATED_PATCH | TRANSDERMAL | Status: DC

## 2012-12-24 ENCOUNTER — Encounter: Payer: Self-pay | Admitting: Internal Medicine

## 2012-12-24 ENCOUNTER — Other Ambulatory Visit: Payer: Self-pay | Admitting: *Deleted

## 2012-12-24 NOTE — Telephone Encounter (Signed)
Called pt and lvm telling him his rx's are ready for him to pick up. They will be up front.

## 2012-12-25 ENCOUNTER — Other Ambulatory Visit: Payer: Self-pay | Admitting: *Deleted

## 2012-12-25 MED ORDER — FENTANYL 50 MCG/HR TD PT72: 1.0000 | MEDICATED_PATCH | TRANSDERMAL | Status: DC

## 2012-12-25 NOTE — Telephone Encounter (Signed)
Please let me know if I need to re-do the rx.

## 2013-01-14 ENCOUNTER — Encounter: Payer: Self-pay | Admitting: Internal Medicine

## 2013-01-15 NOTE — Telephone Encounter (Signed)
Will have an Rx for hydrocodone/APAP 5/325 called in for you

## 2013-01-18 ENCOUNTER — Other Ambulatory Visit: Payer: Self-pay

## 2013-01-20 ENCOUNTER — Encounter: Payer: Self-pay | Admitting: Internal Medicine

## 2013-01-20 ENCOUNTER — Other Ambulatory Visit: Payer: Self-pay | Admitting: *Deleted

## 2013-01-20 MED ORDER — HYDROCODONE-ACETAMINOPHEN 5-325 MG PO TABS: 1.0000 | ORAL_TABLET | Freq: Four times a day (QID) | ORAL | Status: DC | PRN

## 2013-03-05 ENCOUNTER — Encounter: Payer: Self-pay | Admitting: Internal Medicine

## 2013-03-05 ENCOUNTER — Ambulatory Visit (INDEPENDENT_AMBULATORY_CARE_PROVIDER_SITE_OTHER): Payer: Self-pay | Admitting: Internal Medicine

## 2013-03-05 VITALS — BP 130/80 | HR 105 | Temp 97.7°F | Resp 8 | Ht 73.0 in | Wt 190.8 lb

## 2013-03-05 DIAGNOSIS — R52 Pain, unspecified: Secondary | ICD-10-CM

## 2013-03-05 DIAGNOSIS — Z5189 Encounter for other specified aftercare: Secondary | ICD-10-CM

## 2013-03-05 MED ORDER — HYDROCODONE-ACETAMINOPHEN 5-325 MG PO TABS: 1.0000 | ORAL_TABLET | Freq: Four times a day (QID) | ORAL | Status: DC | PRN

## 2013-03-05 MED ORDER — FENTANYL 75 MCG/HR TD PT72: 1.0000 | MEDICATED_PATCH | TRANSDERMAL | Status: DC

## 2013-03-05 NOTE — Patient Instructions (Addendum)
Continuing work on pain management: 1. Fentanyl  - will increase to 75 mcg patch every 72 hours for better pain control 2. Hydrocodone/APAP 5/325 - really need to limit this to no more than 2 a day. If you need more than 2 a day we need to readjust the fentanyl patch! Use MyChart to do this monitoring feedback loop 3. Alternative medicine - you might want to give chinese medicine/accupuncture a try with Dr. Lester Kinsman as an alternative to needling which seems to cause more pain than relief.

## 2013-03-06 NOTE — Assessment & Plan Note (Signed)
Not adequately controlled. Discussed our approach again - pain control not 100% relief; minimal use of breakthrough. He is not ready for more than 1 month of medication.  Plan Increase fentanyl patch to 75 mcg q 72 hr  Hydrocodone/apap 5/325 - target is no more than 2 per day.  If he needs excessive breakthrough he is to call and we can further adjust his patch  Recommend he consider seeing Dr. Lester Kinsman - [facility] and accupuncture instead of needling

## 2013-03-06 NOTE — Progress Notes (Signed)
  Subjective:    Patient ID: Kathrene Alu, male    DOB: 1981/11/25, 32 y.o.   MRN: [id number]  HPI Mr. Tanzi presents for pain management follow up. He has been doing better: more active, swimming, getting out. He reports that he does have "needling" treatments which are painful and leave him with pain for several days. He also reports that he is using breakthrough hydrocodone 3-4 times a day.  PMH, FamHx and SocHx reviewed for any changes and relevance. Current Outpatient Prescriptions on File Prior to Visit  Medication Sig Dispense Refill  . diazepam (VALIUM) 10 MG tablet Take 10 mg by mouth every 6 (six) hours as needed.      . DULoxetine (CYMBALTA) 60 MG capsule Take 90 mg by mouth daily.       . fluticasone (FLONASE) 50 MCG/ACT nasal spray Place 2 sprays into the nose daily.       No current facility-administered medications on file prior to visit.      Review of Systems System review is negative for any constitutional, cardiac, pulmonary, GI or neuro symptoms or complaints other than as described in the HPI.     Objective:   Physical Exam Filed Vitals:   03/05/13 1310  BP: 130/80  Pulse: 105  Temp: 97.7 F (36.5 C)  Resp: 8   Gen'l- WNWD white man in no distress HEENT- PERRLA, not pin-point. Cor- RRR Pulm - normal respirations Neuro - A&O x 3, speech clear, normal gait       Assessment & Plan:

## 2013-03-14 ENCOUNTER — Encounter: Payer: Self-pay | Admitting: Internal Medicine

## 2013-03-24 ENCOUNTER — Telehealth: Payer: Self-pay

## 2013-03-24 NOTE — Telephone Encounter (Signed)
[name] called requesting a verbal order for pt to transition from aquatic therapy to physical therapy per Physical therapist recommendation

## 2013-03-24 NOTE — Telephone Encounter (Signed)
OK to transfer to PT [facility]

## 2013-03-25 ENCOUNTER — Encounter: Payer: Self-pay | Admitting: Internal Medicine

## 2013-03-25 NOTE — Telephone Encounter (Signed)
[name] informed of verbal order.

## 2013-03-28 MED ORDER — FENTANYL 75 MCG/HR TD PT72: 1.0000 | MEDICATED_PATCH | TRANSDERMAL | Status: DC

## 2013-03-28 NOTE — Telephone Encounter (Signed)
Done hardcopy to robin  

## 2013-04-15 ENCOUNTER — Encounter (HOSPITAL_COMMUNITY): Payer: Self-pay | Admitting: Emergency Medicine

## 2013-04-15 ENCOUNTER — Emergency Department (HOSPITAL_COMMUNITY): Payer: BC Managed Care – PPO

## 2013-04-15 ENCOUNTER — Emergency Department (HOSPITAL_COMMUNITY)
Admission: EM | Admit: 2013-04-15 | Discharge: 2013-04-15 | Disposition: A | Discharge status: Home / Self Care | Payer: BC Managed Care – PPO | Attending: Emergency Medicine | Admitting: Emergency Medicine

## 2013-04-15 DIAGNOSIS — Z8679 Personal history of other diseases of the circulatory system: Secondary | ICD-10-CM | POA: Insufficient documentation

## 2013-04-15 DIAGNOSIS — Z872 Personal history of diseases of the skin and subcutaneous tissue: Secondary | ICD-10-CM | POA: Insufficient documentation

## 2013-04-15 DIAGNOSIS — F329 Major depressive disorder, single episode, unspecified: Secondary | ICD-10-CM | POA: Insufficient documentation

## 2013-04-15 DIAGNOSIS — Z87891 Personal history of nicotine dependence: Secondary | ICD-10-CM | POA: Insufficient documentation

## 2013-04-15 DIAGNOSIS — G8929 Other chronic pain: Secondary | ICD-10-CM | POA: Insufficient documentation

## 2013-04-15 DIAGNOSIS — F3289 Other specified depressive episodes: Secondary | ICD-10-CM | POA: Insufficient documentation

## 2013-04-15 DIAGNOSIS — F411 Generalized anxiety disorder: Secondary | ICD-10-CM | POA: Insufficient documentation

## 2013-04-15 DIAGNOSIS — J189 Pneumonia, unspecified organism: Secondary | ICD-10-CM

## 2013-04-15 DIAGNOSIS — Z8781 Personal history of (healed) traumatic fracture: Secondary | ICD-10-CM | POA: Insufficient documentation

## 2013-04-15 DIAGNOSIS — F1020 Alcohol dependence, uncomplicated: Secondary | ICD-10-CM | POA: Insufficient documentation

## 2013-04-15 DIAGNOSIS — R091 Pleurisy: Secondary | ICD-10-CM

## 2013-04-15 DIAGNOSIS — M549 Dorsalgia, unspecified: Secondary | ICD-10-CM | POA: Insufficient documentation

## 2013-04-15 DIAGNOSIS — Z79899 Other long term (current) drug therapy: Secondary | ICD-10-CM | POA: Insufficient documentation

## 2013-04-15 LAB — CBC WITH DIFFERENTIAL/PLATELET
Basophils Relative: 0 % (ref 0–1)
Eosinophils Relative: 3 % (ref 0–5)
HCT: 39 % (ref 39.0–52.0)
Hemoglobin: 13.1 g/dL (ref 13.0–17.0)
Lymphocytes Relative: 23 % (ref 12–46)
MCHC: 33.6 g/dL (ref 30.0–36.0)
MCV: 83.5 fL (ref 78.0–100.0)
Monocytes Absolute: 1.7 10*3/uL — ABNORMAL HIGH (ref 0.1–1.0)
Monocytes Relative: 12 % (ref 3–12)
Neutro Abs: 9.2 10*3/uL — ABNORMAL HIGH (ref 1.7–7.7)

## 2013-04-15 LAB — COMPREHENSIVE METABOLIC PANEL WITH GFR
ALT: 28 U/L (ref 0–53)
AST: 19 U/L (ref 0–37)
Albumin: 3.7 g/dL (ref 3.5–5.2)
Alkaline Phosphatase: 72 U/L (ref 39–117)
BUN: 11 mg/dL (ref 6–23)
CO2: 26 meq/L (ref 19–32)
Calcium: 9.3 mg/dL (ref 8.4–10.5)
Chloride: 100 meq/L (ref 96–112)
Creatinine, Ser: 0.75 mg/dL (ref 0.50–1.35)
GFR calc Af Amer: >90 mL/min (ref >90)
GFR calc non Af Amer: >90 mL/min (ref >90)
Glucose, Bld: 115 mg/dL — ABNORMAL HIGH (ref 70–99)
Potassium: 3.8 meq/L (ref 3.5–5.1)
Sodium: 136 meq/L (ref 135–145)
Total Bilirubin: 0.4 mg/dL (ref 0.3–1.2)
Total Protein: 6.8 g/dL (ref 6.0–8.3)

## 2013-04-15 LAB — POCT I-STAT TROPONIN I: Troponin i, poc: 0 ng/mL (ref 0.00–0.08)

## 2013-04-15 LAB — LIPASE, BLOOD: Lipase: 25 U/L (ref 11–59)

## 2013-04-15 LAB — D-DIMER, QUANTITATIVE: D-Dimer, Quant: 0.59 ug{FEU}/mL — ABNORMAL HIGH (ref 0.00–0.48)

## 2013-04-15 MED ORDER — KETOROLAC TROMETHAMINE 30 MG/ML IJ SOLN
30.0000 mg | Freq: Once | INTRAMUSCULAR | Status: AC
  Administered 2013-04-15: 30 mg via INTRAVENOUS
  Filled 2013-04-15: qty 1

## 2013-04-15 MED ORDER — SODIUM CHLORIDE 0.9 % IV BOLUS (SEPSIS)
1000.0000 mL | Freq: Once | INTRAVENOUS | Status: AC
  Administered 2013-04-15: 1000 mL via INTRAVENOUS

## 2013-04-15 MED ORDER — DEXTROSE 5 % IV SOLN
1.0000 g | Freq: Once | INTRAVENOUS | Status: AC
  Administered 2013-04-15: 1 g via INTRAVENOUS
  Filled 2013-04-15: qty 10

## 2013-04-15 MED ORDER — HYDROMORPHONE HCL PF 1 MG/ML IJ SOLN
1.0000 mg | Freq: Once | INTRAMUSCULAR | Status: AC
  Administered 2013-04-15: 1 mg via INTRAVENOUS
  Filled 2013-04-15: qty 1

## 2013-04-15 MED ORDER — HYDROMORPHONE HCL PF 2 MG/ML IJ SOLN
2.0000 mg | Freq: Once | INTRAMUSCULAR | Status: AC
  Administered 2013-04-15: 2 mg via INTRAVENOUS
  Filled 2013-04-15: qty 1

## 2013-04-15 MED ORDER — IOHEXOL 350 MG/ML SOLN
100.0000 mL | Freq: Once | INTRAVENOUS | Status: AC | PRN
  Administered 2013-04-15: 100 mL via INTRAVENOUS

## 2013-04-15 MED ORDER — AZITHROMYCIN 250 MG PO TABS: ORAL_TABLET | ORAL | Status: DC

## 2013-04-15 MED ORDER — ONDANSETRON HCL 4 MG/2ML IJ SOLN
4.0000 mg | Freq: Once | INTRAMUSCULAR | Status: AC
  Administered 2013-04-15: 4 mg via INTRAVENOUS
  Filled 2013-04-15: qty 2

## 2013-04-15 MED ORDER — FENTANYL CITRATE 0.05 MG/ML IJ SOLN
50.0000 ug | Freq: Once | INTRAMUSCULAR | Status: AC
  Administered 2013-04-15: 50 ug via INTRAVENOUS
  Filled 2013-04-15: qty 2

## 2013-04-15 MED ORDER — SODIUM CHLORIDE 0.9 % IV SOLN
Freq: Once | INTRAVENOUS | Status: AC
  Administered 2013-04-15: 04:00:00 via INTRAVENOUS

## 2013-04-15 MED ORDER — AZITHROMYCIN 250 MG PO TABS
500.0000 mg | ORAL_TABLET | Freq: Once | ORAL | Status: AC
  Administered 2013-04-15: 500 mg via ORAL
  Filled 2013-04-15: qty 2

## 2013-04-15 MED ORDER — MORPHINE SULFATE 4 MG/ML IJ SOLN
4.0000 mg | Freq: Once | INTRAMUSCULAR | Status: AC
  Administered 2013-04-15: 4 mg via INTRAVENOUS
  Filled 2013-04-15: qty 1

## 2013-04-15 NOTE — ED Provider Notes (Signed)
History     CSN: [id number]  Arrival date & time 04/15/13  0147   First MD Initiated Contact with Patient 04/15/13 0251      Chief Complaint  Patient presents with  . Chest Pain   HPI  History provided by the patient. Patient is a 32 year old male with history of anxiety, depression, chronic back pain and alcohol abuse who presents with complaints of right-sided chest pain and shortness of breath. Patient reports waking up suddenly last night early this morning with a sharp right-sided chest pain. He states he feels like he has pain in the "diaphragm" it is sharp and tearing that radiates up through the right chest towards the neck. Pain is worse with any inspiration or deep breath. Symptoms are associated with some shortness of breath feeling. Patient was otherwise well earlier in the day. He does report going through physical therapy for his chronic back pains and was using a upper extremity pedal machine. He denied having any significant soreness or pain or injury from physical therapy activities. Patient does also mention some recent allergy symptoms of rhinorrhea and congestion. He denies having significant cough symptoms. No hemoptysis. No recent fever, chills or sweats. No nausea, vomiting or diarrhea. He denies any abdominal pain to me. No other aggravating or alleviating factors. No other associated symptoms.     Past Medical History  Diagnosis Date  . Tension headache   . Anxiety and depression   . Alcohol abuse   . Skull fracture 2004    Trauma  . Pilonidal cyst     Past Surgical History  Procedure Laterality Date  . Vasectomy  04/2010  . Pilonidal cystectomy  08/01/2011  . Orif right 3rd finger  2012    Dr. Merlyn Lot - see hospital notes.  . Radiofrequency ablation nerves  1/7/214    Family History  Problem Relation Age of Onset  . Depression Mother   . Anxiety disorder Mother   . Alcohol abuse Mother   . Hypertension Father   . Hyperlipidemia Father   . Coronary  artery disease Neg Hx   . Prostate cancer Neg Hx   . Colon cancer Neg Hx   . Diabetes Neg Hx   . Cancer Paternal Aunt     breast    History  Substance Use Topics  . Smoking status: Former Smoker    Types: Cigarettes  . Smokeless tobacco: Never Used     Comment: quit 2 or more years  . Alcohol Use: No      Review of Systems  Constitutional: Negative for fever, chills and diaphoresis.  HENT: Positive for congestion and rhinorrhea.   Respiratory: Positive for shortness of breath. Negative for cough.   Cardiovascular: Positive for chest pain.  Gastrointestinal: Negative for nausea, vomiting, abdominal pain and diarrhea.  All other systems reviewed and are negative.    Allergies  Avelox; Fluoxetine; and Moxifloxacin  Home Medications   Current Outpatient Rx  Name  Route  Sig  Dispense  Refill  . diazepam (VALIUM) 10 MG tablet   Oral   Take 10 mg by mouth every 6 (six) hours as needed.         . DULoxetine (CYMBALTA) 30 MG capsule   Oral   Take 90 mg by mouth daily. Take 3 30mg  capsules to equal 90mg          . fentaNYL (DURAGESIC - DOSED MCG/HR) 75 MCG/HR   Transdermal   Place 1 patch (75 mcg total) onto the skin  every 3 (three) days. To fill May 05, 2013   10 patch   0   . HYDROcodone-acetaminophen (NORCO/VICODIN) 5-325 MG per tablet   Oral   Take 1 tablet by mouth every 6 (six) hours as needed for pain.   60 tablet   1   . prazosin (MINIPRESS) 1 MG capsule   Oral   Take 1 mg by mouth at bedtime.         . triamcinolone (NASACORT) 55 MCG/ACT nasal inhaler   Nasal   Place 2 sprays into the nose daily. allergies           BP 118/77  Pulse 111  Temp(Src) 97.7 F (36.5 C) (Oral)  Resp 18  SpO2 98%  Physical Exam  Nursing note and vitals reviewed. Constitutional: He is oriented to person, place, and time. He appears well-developed and well-nourished. No distress.  HENT:  Head: Normocephalic and atraumatic.  Neck: Normal range of motion. Neck  supple.  Cardiovascular: Regular rhythm.  Tachycardia present.   Pulmonary/Chest: Effort normal and breath sounds normal. No stridor. No respiratory distress. He has no wheezes. He has no rales. He exhibits tenderness.  Patient does have some tenderness over the right chest wall that is somewhat similar to his pain. No gross deformities or swelling. No crepitus.  Abdominal: Soft. There is no tenderness. There is no rebound, no guarding, no tenderness at McBurney's point and negative Murphy's sign.  Musculoskeletal: Normal range of motion. He exhibits no edema and no tenderness.  No clinical signs concerning for DVT  Neurological: He is alert and oriented to person, place, and time.  Skin: Skin is warm. He is not diaphoretic.  Psychiatric: He has a normal mood and affect. His behavior is normal.    ED Course  Procedures   Results for orders placed during the hospital encounter of 04/15/13  CBC WITH DIFFERENTIAL      Result Value Range   WBC 14.7 (*) 4.0 - 10.5 K/uL   RBC 4.67  4.22 - 5.81 MIL/uL   Hemoglobin 13.1  13.0 - 17.0 g/dL   HCT 78.4  69.6 - 29.5 %   MCV 83.5  78.0 - 100.0 fL   MCH 28.1  26.0 - 34.0 pg   MCHC 33.6  30.0 - 36.0 g/dL   RDW 28.4  13.2 - 44.0 %   Platelets 250  150 - 400 K/uL   Neutrophils Relative 63  43 - 77 %   Neutro Abs 9.2 (*) 1.7 - 7.7 K/uL   Lymphocytes Relative 23  12 - 46 %   Lymphs Abs 3.3  0.7 - 4.0 K/uL   Monocytes Relative 12  3 - 12 %   Monocytes Absolute 1.7 (*) 0.1 - 1.0 K/uL   Eosinophils Relative 3  0 - 5 %   Eosinophils Absolute 0.4  0.0 - 0.7 K/uL   Basophils Relative 0  0 - 1 %   Basophils Absolute 0.0  0.0 - 0.1 K/uL  COMPREHENSIVE METABOLIC PANEL      Result Value Range   Sodium 136  135 - 145 mEq/L   Potassium 3.8  3.5 - 5.1 mEq/L   Chloride 100  96 - 112 mEq/L   CO2 26  19 - 32 mEq/L   Glucose, Bld 115 (*) 70 - 99 mg/dL   BUN 11  6 - 23 mg/dL   Creatinine, Ser 1.02  0.50 - 1.35 mg/dL   Calcium 9.3  8.4 - 72.5 mg/dL  Total  Protein 6.8  6.0 - 8.3 g/dL   Albumin 3.7  3.5 - 5.2 g/dL   AST 19  0 - 37 U/L   ALT 28  0 - 53 U/L   Alkaline Phosphatase 72  39 - 117 U/L   Total Bilirubin 0.4  0.3 - 1.2 mg/dL   GFR calc non Af Amer >90  >90 mL/min   GFR calc Af Amer >90  >90 mL/min  LIPASE, BLOOD      Result Value Range   Lipase 25  11 - 59 U/L  D-DIMER, QUANTITATIVE      Result Value Range   D-Dimer, Quant 0.59 (*) 0.00 - 0.48 ug/mL-FEU  POCT I-STAT TROPONIN I      Result Value Range   Troponin i, poc 0.00  0.00 - 0.08 ng/mL   Comment 3                Dg Chest 2 View  04/15/2013  *RADIOLOGY REPORT*  Clinical Data: Chest pain  CHEST - 2 VIEW  Comparison: None.  Findings: Mild right greater than left bibasilar opacities. Cardiomediastinal contours within normal range.  No pleural effusion or pneumothorax.  No acute osseous finding.  IMPRESSION: Mild right greater than left bibasilar opacities; may reflect pneumonia in the appropriate clinical setting.   Original Report Authenticated By: Jearld Lesch, M.D.    Ct Angio Chest W/cm &/or Wo Cm  04/15/2013  *RADIOLOGY REPORT*  Clinical Data: Chest pain  CT ANGIOGRAPHY CHEST  Technique:  Multidetector CT imaging of the chest using the standard protocol during bolus administration of intravenous contrast. Multiplanar reconstructed images including MIPs were obtained and reviewed to evaluate the vascular anatomy.  Contrast: OMNIPAQUE IOHEXOL 350 MG/ML SOLN  Comparison: 04/15/2013 radiograph  Findings: Suboptimal contrast bolus timing.  No main or lobar branch filling defect.  The more peripheral branches are nondiagnostic.  For example, there is a questionable filling defect within the right lower lobe segmental branch as seen on series 5 image 140 for however this may simply represent mixing artifact.  Normal caliber aorta.  Upper normal to mildly enlarged heart size. No pericardial effusion.  Prominent subcarinal lymph node at 1 cm short axis, may be reactive.   Hepatic steatosis.  Trace right pleural effusion.  Central airways are patent.  Bibasilar opacities.  Bibasilar interlobular septal thickening and ground-glass opacities.  More confluent/nodular lingular opacity on series 6 image 58.  No pneumothorax.  No acute osseous finding.  IMPRESSION: Suboptimal contrast bolus timing.  I cannot exclude a right lower lobe segmental filling defect.  If clinical concern for PE remains high, recommend repeat examination or VQ scan.  Bibasilar opacities, right greater than left, are nonspecific; may reflect pneumonia or edema.  Hepatic steatosis.   Original Report Authenticated By: Jearld Lesch, M.D.      1. CAP (community acquired pneumonia)   2. Pleurisy       MDM   3:05 AM patient seen and evaluated. Patient resting comfortably appears well no acute distress. Patient's currently with normal respirations and O2 sats.  Patient continued to have pain. He does have history of chronic pain. He does have improvements of his heart rate shortly after receiving pain medications but has pain returns and his heart rate elevates. He has normal respirations and O2 sats. No signs respiratory distress. He has no significant risk factors for PE. CT does not show any signs of large PE. Doubt any small subsegmental PE. Continue to  treat pain.  Patient discussed with attending physician. He agrees with plan. Suspect slight elevated heart rate due to chronic pain. Patient otherwise appears well able to be discharged home.  Patient having some improvements we'll give additional pain medicine he feels able to return home and thinks his home pain medications for his chronic pains will help him. At this time we'll discharge. She can return precautions provided.    Angus Seller, PA-C 04/15/13 [phone]

## 2013-04-15 NOTE — ED Notes (Signed)
Patient presents to ED with c/o intermittent "tearing and sharp" pain to RUQ of abdomen and right upper chest, onset last night, woke up with "worse" pain.

## 2013-04-15 NOTE — ED Provider Notes (Signed)
Medical screening examination/treatment/procedure(s) were performed by non-physician practitioner and as supervising physician I was immediately available for consultation/collaboration.   Hanley Seamen, MD 04/15/13 [phone]

## 2013-04-16 ENCOUNTER — Ambulatory Visit: Payer: BC Managed Care – PPO | Admitting: Internal Medicine

## 2013-04-18 ENCOUNTER — Encounter: Payer: Self-pay | Admitting: Internal Medicine

## 2013-04-18 ENCOUNTER — Other Ambulatory Visit: Payer: Self-pay | Admitting: Internal Medicine

## 2013-04-18 MED ORDER — HYDROCODONE-ACETAMINOPHEN 5-325 MG PO TABS: 1.0000 | ORAL_TABLET | Freq: Four times a day (QID) | ORAL | Status: DC | PRN

## 2013-04-18 NOTE — Telephone Encounter (Signed)
Hydrocodone called to pharmacy  

## 2013-04-18 NOTE — Telephone Encounter (Signed)
rx already done earlier today 

## 2013-04-18 NOTE — Telephone Encounter (Signed)
Done hardcopy to robin  

## 2013-04-19 ENCOUNTER — Encounter: Payer: Self-pay | Admitting: Internal Medicine

## 2013-05-13 ENCOUNTER — Encounter: Payer: Self-pay | Admitting: Internal Medicine

## 2013-05-13 ENCOUNTER — Ambulatory Visit (INDEPENDENT_AMBULATORY_CARE_PROVIDER_SITE_OTHER): Payer: BC Managed Care – PPO | Admitting: Internal Medicine

## 2013-05-13 VITALS — BP 130/88 | HR 66 | Temp 98.1°F | Resp 12 | Ht 73.0 in | Wt 180.0 lb

## 2013-05-13 DIAGNOSIS — M545 Low back pain: Secondary | ICD-10-CM

## 2013-05-13 DIAGNOSIS — G8929 Other chronic pain: Secondary | ICD-10-CM

## 2013-05-13 DIAGNOSIS — R52 Pain, unspecified: Secondary | ICD-10-CM

## 2013-05-13 MED ORDER — FENTANYL 100 MCG/HR TD PT72: 1.0000 | MEDICATED_PATCH | TRANSDERMAL | Status: DC

## 2013-05-13 MED ORDER — HYDROCODONE-ACETAMINOPHEN 5-325 MG PO TABS: 1.0000 | ORAL_TABLET | Freq: Four times a day (QID) | ORAL | Status: DC | PRN

## 2013-05-13 NOTE — Progress Notes (Signed)
Subjective:    Patient ID: [name], male    DOB: 09-07-1981, 32 y.o.   MRN: [id number]  HPI Mr. Stecher presents for follow up. He reports that he has been awakening at night crying with back pain. The fentanyl patch doesn't seem to be providing the relief that it did originally. He is taking Norco roughly twice a day. He has been participating in PT - see scanned report.   He was seen May 13th for sudden on-set of SOB and difficulty breathing. ED notes, labs and images reviewed: minor elevation in WBC to 14.7 with normal diff; D-dimer 0.59 leading to CT angio chest that was degraded and did not completely r/o small emboli RLL, question of atelectatic changes vs infiltrate. He was provided IV antibiotic dose and sent out on Z-pak. He did get a supplemental pain Rx.   He doesn't have any other complaints.  Past Medical History  Diagnosis Date  . Tension headache   . Anxiety and depression   . Alcohol abuse   . Skull fracture 2004    Trauma  . Pilonidal cyst    Past Surgical History  Procedure Laterality Date  . Vasectomy  04/2010  . Pilonidal cystectomy  08/01/2011  . Orif right 3rd finger  2012    Dr. Merlyn Lot - see hospital notes.  . Radiofrequency ablation nerves  1/7/214   Family History  Problem Relation Age of Onset  . Depression Mother   . Anxiety disorder Mother   . Alcohol abuse Mother   . Hypertension Father   . Hyperlipidemia Father   . Coronary artery disease Neg Hx   . Prostate cancer Neg Hx   . Colon cancer Neg Hx   . Diabetes Neg Hx   . Cancer Paternal Aunt     breast   History   Social History  . Marital Status: Married    Spouse Name: N/A    Number of Children: 0  . Years of Education: 12   Occupational History  . Student    Social History Main Topics  . Smoking status: Former Smoker    Types: Cigarettes  . Smokeless tobacco: Never Used     Comment: quit 2 or more years  . Alcohol Use: No  . Drug Use: No  . Sexually Active: Yes -- Male  partner(s)   Other Topics Concern  . Not on file   Social History Narrative   HSG, attending [employer] (09/2010). Married June 2011.No children. NO history of physical or sexual abuse. Not working at this time.              Current Outpatient Prescriptions on File Prior to Visit  Medication Sig Dispense Refill  . diazepam (VALIUM) 10 MG tablet Take 5 mg by mouth every 6 (six) hours as needed.       . DULoxetine (CYMBALTA) 30 MG capsule Take 90 mg by mouth daily. Take 3 30mg  capsules to equal 90mg       . fentaNYL (DURAGESIC - DOSED MCG/HR) 75 MCG/HR Place 1 patch (75 mcg total) onto the skin every 3 (three) days. To fill May 05, 2013  10 patch  0  . prazosin (MINIPRESS) 1 MG capsule Take 1 mg by mouth at bedtime.      . triamcinolone (NASACORT) 55 MCG/ACT nasal inhaler Place 2 sprays into the nose daily. allergies       No current facility-administered medications on file prior to visit.      Review of Systems  System review is negative for any constitutional, cardiac, pulmonary, GI or neuro symptoms or complaints other than as described in the HPI.     Objective:   Physical Exam Filed Vitals:   05/13/13 1515  BP: 130/88  Pulse: 66  Temp: 98.1 F (36.7 C)  Resp: 12   gen'l - WNWD white man HEENT -  C&S clear Cor - RRR PUlm       Assessment & Plan:

## 2013-05-13 NOTE — Patient Instructions (Addendum)
Community acquired pneumonia appears to have resolved.  Pain management: suboptimal control - will increase the duragesic patch to 100 mcg/72 hr  Renew Norco.  Will redo PT referral.

## 2013-05-14 NOTE — Assessment & Plan Note (Signed)
Continues to have chronic back pain. He has been participating in PT including pool exercise. Final report states that he will need to show continue progress to qualify for on-going therapy. He is committed to doing better  Plan Renewal of referral to PT

## 2013-05-14 NOTE — Assessment & Plan Note (Signed)
He has done well but lately his pain is worse.  Plan Increase Fentanyl patch to 100 mcg/72 hrs - Rx x 3 provided

## 2013-07-09 ENCOUNTER — Encounter: Payer: Self-pay | Admitting: Internal Medicine

## 2013-08-07 ENCOUNTER — Encounter: Payer: Self-pay | Admitting: Internal Medicine

## 2013-08-07 ENCOUNTER — Telehealth: Payer: Self-pay | Admitting: *Deleted

## 2013-08-07 ENCOUNTER — Ambulatory Visit (INDEPENDENT_AMBULATORY_CARE_PROVIDER_SITE_OTHER): Payer: BC Managed Care – PPO | Admitting: Internal Medicine

## 2013-08-07 VITALS — BP 138/80 | HR 109 | Temp 98.7°F | Wt 176.0 lb

## 2013-08-07 DIAGNOSIS — Z5189 Encounter for other specified aftercare: Secondary | ICD-10-CM

## 2013-08-07 DIAGNOSIS — M545 Low back pain: Secondary | ICD-10-CM

## 2013-08-07 DIAGNOSIS — R52 Pain, unspecified: Secondary | ICD-10-CM

## 2013-08-07 MED ORDER — HYDROCODONE-ACETAMINOPHEN 5-325 MG PO TABS: 1.0000 | ORAL_TABLET | Freq: Four times a day (QID) | ORAL | Status: DC | PRN

## 2013-08-07 MED ORDER — FENTANYL 100 MCG/HR TD PT72: 1.0000 | MEDICATED_PATCH | TRANSDERMAL | Status: DC

## 2013-08-07 NOTE — Progress Notes (Signed)
Subjective:    Patient ID: [name], male    DOB: 1981/09/27, 32 y.o.   MRN: [id number]  HPI [name] went to [facility] for opinion in regard to back pain. They said no surgical problem. He did have an ablation procedure by Dr. Alvester Morin at [facility] ortho.  He is interested in renewal of referral to Dr. Alvester Morin for possible repeat ablation.  He needs refill on meds. He has had no adverse effects. He has lost two patches due to poor adherence to the skin.  Past Medical History  Diagnosis Date  . Tension headache   . Anxiety and depression   . Alcohol abuse   . Skull fracture 2004    Trauma  . Pilonidal cyst    Past Surgical History  Procedure Laterality Date  . Vasectomy  04/2010  . Pilonidal cystectomy  08/01/2011  . Orif right 3rd finger  2012    Dr. Merlyn Lot - see hospital notes.  . Radiofrequency ablation nerves  1/7/214   Family History  Problem Relation Age of Onset  . Depression Mother   . Anxiety disorder Mother   . Alcohol abuse Mother   . Hypertension Father   . Hyperlipidemia Father   . Coronary artery disease Neg Hx   . Prostate cancer Neg Hx   . Colon cancer Neg Hx   . Diabetes Neg Hx   . Cancer Paternal Aunt     breast   History   Social History  . Marital Status: Married    Spouse Name: N/A    Number of Children: 0  . Years of Education: 12   Occupational History  . Student    Social History Main Topics  . Smoking status: Former Smoker    Types: Cigarettes  . Smokeless tobacco: Never Used     Comment: quit 2 or more years  . Alcohol Use: No  . Drug Use: No  . Sexual Activity: Yes    Partners: Female   Other Topics Concern  . Not on file   Social History Narrative   HSG, attending [employer] (09/2010). Married June 2011.No children. NO history of physical or sexual abuse. Not working at this time.              Current Outpatient Prescriptions on File Prior to Visit  Medication Sig Dispense Refill  . diazepam (VALIUM) 10 MG tablet Take 10 mg by  mouth every 6 (six) hours as needed.       . DULoxetine (CYMBALTA) 30 MG capsule Take 30 mg by mouth daily. Take 3 30mg  capsules to equal 90mg       . prazosin (MINIPRESS) 1 MG capsule Take 1 mg by mouth at bedtime.      . triamcinolone (NASACORT) 55 MCG/ACT nasal inhaler Place 2 sprays into the nose daily. allergies       No current facility-administered medications on file prior to visit.    Review of Systems System review is negative for any constitutional, cardiac, pulmonary, GI or neuro symptoms or complaints other than as described in the HPI.     Objective:   Physical Exam Filed Vitals:   08/07/13 1308  BP: 138/80  Pulse: 109  Temp: 98.7 F (37.1 C)   Gen'l - WNWD young man in no distress. Using a cane as he walks HEENT - C&S clear, PERRLA Cor - RRR Pulm - normal respirations Neuro - speech is clear, gait is normal        Assessment & Plan:

## 2013-08-07 NOTE — Assessment & Plan Note (Signed)
Recent evaluation at [facility]: no surgical condition.  Plan Refer to Dr. Alvester Morin.

## 2013-08-07 NOTE — Telephone Encounter (Signed)
Pt called requesting early refill on Fentanyl patch.  States one came off in the shower and the other he dropped on the floor.  Please advise

## 2013-08-07 NOTE — Assessment & Plan Note (Signed)
No problems in the interval. He feels he needs repeat ablation procedure for his back pain.  Plan Refilled Rx's x 3 months.

## 2013-08-08 ENCOUNTER — Telehealth: Payer: Self-pay

## 2013-08-08 NOTE — Telephone Encounter (Signed)
Complete by Donnetta Simpers

## 2013-08-08 NOTE — Telephone Encounter (Signed)
Phone call to [facility] [phone] [address] letting them know patient is able to fill his Fentanyl patch and Hydrocodone early that is why the scripts given to him yesterday signed by Dr Debby Bud were not dated so he can get them filled.

## 2013-10-09 ENCOUNTER — Other Ambulatory Visit: Payer: Self-pay

## 2013-10-15 ENCOUNTER — Encounter: Payer: Self-pay | Admitting: Internal Medicine

## 2013-10-15 ENCOUNTER — Ambulatory Visit (INDEPENDENT_AMBULATORY_CARE_PROVIDER_SITE_OTHER): Payer: BC Managed Care – PPO | Admitting: Internal Medicine

## 2013-10-15 VITALS — BP 142/92 | HR 101 | Temp 98.1°F | Wt 174.4 lb

## 2013-10-15 DIAGNOSIS — R52 Pain, unspecified: Secondary | ICD-10-CM

## 2013-10-15 DIAGNOSIS — Z5189 Encounter for other specified aftercare: Secondary | ICD-10-CM

## 2013-10-15 DIAGNOSIS — M545 Low back pain: Secondary | ICD-10-CM

## 2013-10-15 MED ORDER — FENTANYL 100 MCG/HR TD PT72: 100.0000 ug | MEDICATED_PATCH | TRANSDERMAL | Status: DC

## 2013-10-15 NOTE — Patient Instructions (Signed)
Chronic and progressive back pain.   Plan Change duragesic patch to q 48 hrs  Referral to [facility]- dr. Ardell Isaacs

## 2013-10-15 NOTE — Progress Notes (Signed)
Pre visit review using our clinic review tool, if applicable. No additional management support is needed unless otherwise documented below in the visit note. 

## 2013-10-15 NOTE — Progress Notes (Signed)
  Subjective:    Patient ID: [name], male    DOB: 1981-09-30, 32 y.o.   MRN: [id number]  HPI [name] presents for continued back pain and depression. He recently had nerve ablation at L4-5, L5-S1 by Dr. Alvester Rose at [facility] orthopedics. He is more debilitated and also depressed. He reports that his psychiatrist feels the pain is interfering with the effectiveness of the anti-depressants. He has not been a surgical candidate.    Review of Systems     Objective:   Physical Exam        Assessment & Plan:

## 2013-10-16 NOTE — Assessment & Plan Note (Signed)
Continue and progressive back pain. He was last seen at [facility] ortho and did have nerve ablation procedure with Dr. Delton See. He continues to have pain. He has had multiple neurosurgical and orthopedic evaluations. He is not a surgical candidate. He does see a psychiatrist and is treated.   Plan Shorten Fentanyl duration to 48 hrs - new Rx provided and call made to pharmacy  Referal to [facility] - Dr. Jordan Likes

## 2013-10-16 NOTE — Assessment & Plan Note (Signed)
Changed fentanyl to 48 hr cycle Referral to pain management

## 2013-10-31 ENCOUNTER — Other Ambulatory Visit: Payer: Self-pay | Admitting: Internal Medicine

## 2013-10-31 DIAGNOSIS — M545 Low back pain: Secondary | ICD-10-CM

## 2013-10-31 MED ORDER — FENTANYL 100 MCG/HR TD PT72: 100.0000 ug | MEDICATED_PATCH | TRANSDERMAL | Status: DC

## 2013-10-31 MED ORDER — HYDROCODONE-ACETAMINOPHEN 5-325 MG PO TABS: 1.0000 | ORAL_TABLET | Freq: Four times a day (QID) | ORAL | Status: DC | PRN

## 2013-10-31 NOTE — Telephone Encounter (Signed)
He was just seen - changed to fentanyl patch q 48 hrs - OK for new Rx Dec 12; norco Dec 4th. He may have 3 Rx for each with the appropriate dates.

## 2013-10-31 NOTE — Telephone Encounter (Signed)
Patient is requesting 3 scripts for each med

## 2013-10-31 NOTE — Telephone Encounter (Signed)
Done

## 2013-12-19 ENCOUNTER — Encounter: Payer: Self-pay | Admitting: Internal Medicine

## 2013-12-19 DIAGNOSIS — M545 Low back pain, unspecified: Secondary | ICD-10-CM

## 2014-01-29 ENCOUNTER — Ambulatory Visit: Payer: BC Managed Care – PPO | Admitting: Internal Medicine

## 2014-02-01 ENCOUNTER — Encounter: Payer: Self-pay | Admitting: Internal Medicine

## 2014-02-01 DIAGNOSIS — M545 Low back pain, unspecified: Secondary | ICD-10-CM

## 2014-02-02 ENCOUNTER — Encounter: Payer: Self-pay | Admitting: Internal Medicine

## 2014-02-02 MED ORDER — HYDROCODONE-ACETAMINOPHEN 5-325 MG PO TABS: 1.0000 | ORAL_TABLET | Freq: Four times a day (QID) | ORAL | Status: DC | PRN

## 2014-02-09 ENCOUNTER — Ambulatory Visit (INDEPENDENT_AMBULATORY_CARE_PROVIDER_SITE_OTHER): Payer: BC Managed Care – PPO | Admitting: Internal Medicine

## 2014-02-09 ENCOUNTER — Encounter: Payer: Self-pay | Admitting: Internal Medicine

## 2014-02-09 VITALS — BP 124/80 | HR 95 | Temp 98.3°F | Wt 180.0 lb

## 2014-02-09 DIAGNOSIS — M545 Low back pain, unspecified: Secondary | ICD-10-CM

## 2014-02-09 DIAGNOSIS — R52 Pain, unspecified: Secondary | ICD-10-CM

## 2014-02-09 DIAGNOSIS — Z5189 Encounter for other specified aftercare: Secondary | ICD-10-CM

## 2014-02-09 MED ORDER — BUPROPION HCL ER (XL) 150 MG PO TB24: 450.0000 mg | ORAL_TABLET | Freq: Every day | ORAL | Status: DC

## 2014-02-09 MED ORDER — FLUOXETINE HCL 20 MG PO TABS: 20.0000 mg | ORAL_TABLET | Freq: Every day | ORAL | Status: DC

## 2014-02-09 MED ORDER — FENTANYL 100 MCG/HR TD PT72: 100.0000 ug | MEDICATED_PATCH | TRANSDERMAL | Status: DC

## 2014-02-09 NOTE — Progress Notes (Signed)
Pre visit review using our clinic review tool, if applicable. No additional management support is needed unless otherwise documented below in the visit note. 

## 2014-02-09 NOTE — Progress Notes (Signed)
   Subjective:    Patient ID: [name], male    DOB: Apr 10, 1981, 33 y.o.   MRN: [id number]  HPI [name] is transferring from Cymbalta to Wellbutrin using fluoxetine bridge under the direction of Dr. Toy Care.   He does need refill for fentanyl patches. He has been doing OK but still c/o pain. He has not had GI symptoms or mental status changes. For his back pain he will be [facility]  PMH, FamHx and SocHx reviewed for any changes and relevance.  Current Outpatient Prescriptions on File Prior to Visit  Medication Sig Dispense Refill  . diazepam (VALIUM) 10 MG tablet Take 10 mg by mouth every 6 (six) hours as needed.       Marland Kitchen HYDROcodone-acetaminophen (NORCO/VICODIN) 5-325 MG per tablet Take 1 tablet by mouth every 6 (six) hours as needed. May fill on 04/06/14  120 tablet  0  . prazosin (MINIPRESS) 1 MG capsule Take 1 mg by mouth at bedtime.      . triamcinolone (NASACORT) 55 MCG/ACT nasal inhaler Place 2 sprays into the nose daily. allergies       No current facility-administered medications on file prior to visit.      Review of Systems System review is negative for any constitutional, cardiac, pulmonary, GI or neuro symptoms or complaints other than as described in the HPI.     Objective:   Physical Exam Filed Vitals:   02/09/14 1545  BP: 124/80  Pulse: 95  Temp: 98.3 F (36.8 C)   Gen'l - WNWD man in no acute distress HEENT- C&S clear Cor RRR Pulm - normal  MSK/Neuro - able to stand w/o assist, ambulates with single point cane. No tremor or instability on 100' walk.       Assessment & Plan:

## 2014-02-10 NOTE — Assessment & Plan Note (Signed)
Patient presents for renewal of fentanyl patches. Reviewed his controlled substance contract. He met with [facility] but was unable to provide a urine specimen at this visit.  Plan Continue present regimen  May be a candidate for [facility].

## 2014-02-10 NOTE — Assessment & Plan Note (Signed)
To date he has seen ortho and NS. At this point he is waiting to see Dr. Patrice Paradise for another ortho opinion.

## 2014-03-16 ENCOUNTER — Other Ambulatory Visit: Payer: Self-pay | Admitting: Internal Medicine

## 2014-03-17 NOTE — Progress Notes (Signed)
   Subjective:    Patient ID: [name], male    DOB: 1981/04/03, 33 y.o.   MRN: [id number]  HPI Cancelled appointment   Review of Systems     Objective:   Physical Exam        Assessment & Plan:

## 2014-06-21 ENCOUNTER — Encounter (HOSPITAL_COMMUNITY): Payer: Self-pay | Admitting: Emergency Medicine

## 2014-06-21 ENCOUNTER — Emergency Department (HOSPITAL_COMMUNITY)
Admission: EM | Admit: 2014-06-21 | Discharge: 2014-06-22 | Disposition: A | Discharge status: Home / Self Care | Payer: BC Managed Care – PPO | Attending: Emergency Medicine | Admitting: Emergency Medicine

## 2014-06-21 DIAGNOSIS — F1193 Opioid use, unspecified with withdrawal: Secondary | ICD-10-CM

## 2014-06-21 DIAGNOSIS — R112 Nausea with vomiting, unspecified: Secondary | ICD-10-CM | POA: Insufficient documentation

## 2014-06-21 DIAGNOSIS — F329 Major depressive disorder, single episode, unspecified: Secondary | ICD-10-CM | POA: Insufficient documentation

## 2014-06-21 DIAGNOSIS — R5383 Other fatigue: Secondary | ICD-10-CM

## 2014-06-21 DIAGNOSIS — F1123 Opioid dependence with withdrawal: Secondary | ICD-10-CM

## 2014-06-21 DIAGNOSIS — F1310 Sedative, hypnotic or anxiolytic abuse, uncomplicated: Secondary | ICD-10-CM | POA: Insufficient documentation

## 2014-06-21 DIAGNOSIS — Z87891 Personal history of nicotine dependence: Secondary | ICD-10-CM | POA: Insufficient documentation

## 2014-06-21 DIAGNOSIS — R5381 Other malaise: Secondary | ICD-10-CM | POA: Insufficient documentation

## 2014-06-21 DIAGNOSIS — Z8781 Personal history of (healed) traumatic fracture: Secondary | ICD-10-CM | POA: Insufficient documentation

## 2014-06-21 DIAGNOSIS — Z872 Personal history of diseases of the skin and subcutaneous tissue: Secondary | ICD-10-CM | POA: Insufficient documentation

## 2014-06-21 DIAGNOSIS — M545 Low back pain, unspecified: Secondary | ICD-10-CM | POA: Insufficient documentation

## 2014-06-21 DIAGNOSIS — F1210 Cannabis abuse, uncomplicated: Secondary | ICD-10-CM | POA: Insufficient documentation

## 2014-06-21 DIAGNOSIS — Z79899 Other long term (current) drug therapy: Secondary | ICD-10-CM | POA: Insufficient documentation

## 2014-06-21 DIAGNOSIS — F19939 Other psychoactive substance use, unspecified with withdrawal, unspecified: Principal | ICD-10-CM | POA: Insufficient documentation

## 2014-06-21 DIAGNOSIS — F3289 Other specified depressive episodes: Secondary | ICD-10-CM | POA: Insufficient documentation

## 2014-06-21 DIAGNOSIS — F341 Dysthymic disorder: Secondary | ICD-10-CM

## 2014-06-21 DIAGNOSIS — R000 Tachycardia, unspecified: Secondary | ICD-10-CM | POA: Insufficient documentation

## 2014-06-21 DIAGNOSIS — F411 Generalized anxiety disorder: Secondary | ICD-10-CM | POA: Insufficient documentation

## 2014-06-21 LAB — COMPREHENSIVE METABOLIC PANEL
ALBUMIN: 4.2 g/dL (ref 3.5–5.2)
ALK PHOS: 81 U/L (ref 39–117)
ALT: 49 U/L (ref 0–53)
ANION GAP: 15 (ref 5–15)
AST: 26 U/L (ref 0–37)
BILIRUBIN TOTAL: 0.3 mg/dL (ref 0.3–1.2)
BUN: 11 mg/dL (ref 6–23)
CHLORIDE: 105 meq/L (ref 96–112)
CO2: 25 mEq/L (ref 19–32)
Calcium: 10.4 mg/dL (ref 8.4–10.5)
Creatinine, Ser: 0.83 mg/dL (ref 0.50–1.35)
GFR calc Af Amer: >90 mL/min (ref >90)
GFR calc non Af Amer: >90 mL/min (ref >90)
Glucose, Bld: 108 mg/dL — ABNORMAL HIGH (ref 70–99)
POTASSIUM: 4 meq/L (ref 3.7–5.3)
Sodium: 145 mEq/L (ref 137–147)
Total Protein: 8 g/dL (ref 6.0–8.3)

## 2014-06-21 LAB — CBC
HCT: 44.2 % (ref 39.0–52.0)
Hemoglobin: 15.2 g/dL (ref 13.0–17.0)
MCH: 28.7 pg (ref 26.0–34.0)
MCHC: 34.4 g/dL (ref 30.0–36.0)
MCV: 83.6 fL (ref 78.0–100.0)
Platelets: 359 10*3/uL (ref 150–400)
RBC: 5.29 MIL/uL (ref 4.22–5.81)
RDW: 13 % (ref 11.5–15.5)
WBC: 12.4 10*3/uL — AB (ref 4.0–10.5)

## 2014-06-21 LAB — ACETAMINOPHEN LEVEL

## 2014-06-21 LAB — SALICYLATE LEVEL: Salicylate Lvl: <2 mg/dL — ABNORMAL LOW (ref 2.8–20.0)

## 2014-06-21 LAB — ETHANOL: Alcohol, Ethyl (B): <11 mg/dL (ref 0–11)

## 2014-06-21 MED ORDER — DIAZEPAM 5 MG/ML IJ SOLN
10.0000 mg | Freq: Once | INTRAMUSCULAR | Status: AC
  Administered 2014-06-21: 10 mg via INTRAVENOUS
  Filled 2014-06-21: qty 2

## 2014-06-21 MED ORDER — ONDANSETRON HCL 4 MG/2ML IJ SOLN
4.0000 mg | Freq: Once | INTRAMUSCULAR | Status: AC
  Administered 2014-06-21: 4 mg via INTRAVENOUS
  Filled 2014-06-21: qty 2

## 2014-06-21 MED ORDER — SODIUM CHLORIDE 0.9 % IV BOLUS (SEPSIS)
2000.0000 mL | Freq: Once | INTRAVENOUS | Status: AC
  Administered 2014-06-21: 2000 mL via INTRAVENOUS

## 2014-06-21 NOTE — ED Notes (Signed)
Pt states he is trying to stop taking prescribed opiates  Pt states he removed his fentanyl patch off his arm yesterday at 5pm  Pt states he has pain all over, violently spastic in bed, crawling around all over the place, nausea  Pt states he was prescribed fentanyl that kept going up in dose and was taking hydrocodone  Pt states he was tapering off his fentanyl himself by cutting the strips in pieces and has gotten himself down to 55mcg

## 2014-06-21 NOTE — ED Provider Notes (Signed)
CSN: [id number]     Arrival date & time 06/21/14  2024 History   First MD Initiated Contact with Patient 06/21/14 2110     Chief Complaint  Patient presents with  . Opiate Withdrawal      (Consider location/radiation/quality/duration/timing/severity/associated sxs/prior Treatment) The history is provided by the patient and medical records. No language interpreter was used.    [name] is a 33 y.o. male  with a hx of narcotic usage presents to the Emergency Department complaining of gradual, persistent, progressively worsening opiate withdrawl onset 16 hours ago. Pt reports low back injury that did not improve with therapy and pain was treated with Fentanyl 169mcg.  Pt reports he was kicked out of a pain clinic for use of Benzos. Pt reports he has been unable to find a pain clinic. Pt reports he was weaning himself off the fentanyl patches and was able to get to 26mcg and has stayed there since, but was continuing to have pain.  Pt reports he ran out completely at 5pm yesterday and he reports the symptoms began at 4am. He reports extreme pain in his back with myalgias, nausea (without vomiting), generalized weakness, diaphoresis.  Pt reports taking promethazine and imodium to prevent vomiting and diarrhea.  Associated symptoms include feeling of dehydration.  Pt pt reports taking both clonidine and klonopin to help with Withdrawal symptoms in addition to the robaxin and librium.  Pt reports all these medications came from Dr. Robina Ade at [facility] to help him detox.  No aggravating or alleviating factors.  Pt denies EtOH use, cigarettes, but does admit to Marijuana usage especially today.  Pt reports night terrors as well.  Pt reports he has never been to a drug rehab facility.  Pt denies fever, headache, neck pain, chest pain, shortness of breath, abdominal pain, vomiting, diarrhea, numbness, syncope, dysuria.     Past Medical History  Diagnosis Date  . Tension headache   . Anxiety and  depression   . Alcohol abuse   . Skull fracture 2004    Trauma  . Pilonidal cyst    Past Surgical History  Procedure Laterality Date  . Vasectomy  04/2010  . Pilonidal cystectomy  08/01/2011  . Orif right 3rd finger  2012    Dr. Fredna Dow - see hospital notes.  . Radiofrequency ablation nerves  1/7/214   Family History  Problem Relation Age of Onset  . Depression Mother   . Anxiety disorder Mother   . Alcohol abuse Mother   . Hypertension Father   . Hyperlipidemia Father   . Coronary artery disease Neg Hx   . Prostate cancer Neg Hx   . Colon cancer Neg Hx   . Diabetes Neg Hx   . Cancer Paternal Aunt     breast   History  Substance Use Topics  . Smoking status: Former Smoker    Types: Cigarettes  . Smokeless tobacco: Never Used     Comment: quit 2 or more years  . Alcohol Use: No    Review of Systems  Constitutional: Positive for diaphoresis. Negative for fever, appetite change, fatigue and unexpected weight change.  HENT: Negative for mouth sores.   Eyes: Negative for visual disturbance.  Respiratory: Negative for cough, chest tightness, shortness of breath and wheezing.   Cardiovascular: Negative for chest pain.  Gastrointestinal: Positive for nausea and vomiting. Negative for abdominal pain, diarrhea and constipation.  Endocrine: Negative for polydipsia, polyphagia and polyuria.  Genitourinary: Negative for dysuria, urgency, frequency and hematuria.  Musculoskeletal: Negative for back pain and neck stiffness.  Skin: Negative for rash.  Allergic/Immunologic: Negative for immunocompromised state.  Neurological: Positive for weakness (generalized). Negative for syncope, light-headedness and headaches.  Hematological: Does not bruise/bleed easily.  Psychiatric/Behavioral: Negative for sleep disturbance. The patient is not nervous/anxious.       Allergies  Avelox; Fluoxetine; and Moxifloxacin  Home Medications   Prior to Admission medications   Medication Sig  Start Date End Date Taking? Authorizing Provider  chlordiazePOXIDE (LIBRIUM) 25 MG capsule Take 25 mg by mouth 3 (three) times daily as needed for anxiety.   Yes Historical Provider, MD  clonazePAM (KLONOPIN) 1 MG tablet Take 1-2 mg by mouth 3 (three) times daily.   Yes Historical Provider, MD  cloNIDine (CATAPRES) 0.1 MG tablet Take 0.1 mg by mouth 4 (four) times daily.   Yes Historical Provider, MD  loperamide (IMODIUM) 2 MG capsule Take 2 mg by mouth as needed for diarrhea or loose stools (diarrhea).   Yes Historical Provider, MD  methocarbamol (ROBAXIN) 500 MG tablet Take 500 mg by mouth every 8 (eight) hours as needed for muscle spasms (muscle spasm).   Yes Historical Provider, MD  promethazine (PHENERGAN) 25 MG tablet Take 25 mg by mouth every 6 (six) hours as needed for nausea or vomiting (nausea).   Yes Historical Provider, MD   BP 105/71  Pulse 63  Temp(Src) 98.5 F (36.9 C) (Oral)  Resp 20  Ht 6\' 1"  (1.854 m)  Wt 175 lb (79.379 kg)  BMI 23.09 kg/m2  SpO2 98% Physical Exam  Nursing note and vitals reviewed. Constitutional: He is oriented to person, place, and time. He appears well-developed and well-nourished. He appears distressed.  Awake, alert, nontoxic appearance Patient appears uncomfortable  HENT:  Head: Normocephalic and atraumatic.  Mouth/Throat: Oropharynx is clear and moist. No oropharyngeal exudate.  Eyes: Conjunctivae are normal. No scleral icterus.  Neck: Normal range of motion. Neck supple.  Full ROM without pain  Cardiovascular: Normal rate, regular rhythm, normal heart sounds and intact distal pulses.   No murmur heard. Tachycardia  Pulmonary/Chest: Effort normal and breath sounds normal. No respiratory distress. He has no wheezes.  Abdominal: Soft. Bowel sounds are normal. He exhibits no distension and no mass. There is no tenderness. There is no rebound and no guarding.  Musculoskeletal: Normal range of motion. He exhibits no edema.  Full range of motion  of the T-spine and L-spine No tenderness to palpation of the spinous processes of the T-spine or L-spine Tenderness to palpation of the paraspinous muscles of the L-spine  Lymphadenopathy:    He has no cervical adenopathy.  Neurological: He is alert and oriented to person, place, and time. He has normal reflexes.  Speech is clear and goal oriented, follows commands Normal 5/5 strength in upper and lower extremities bilaterally including dorsiflexion and plantar flexion, strong and equal grip strength Sensation normal to light and sharp touch Moves extremities without ataxia, coordination intact Normal gait Normal balance  Skin: Skin is warm and dry. No rash noted. He is not diaphoretic. No erythema.  No diaphoresis  Psychiatric: He has a normal mood and affect. His behavior is normal.    ED Course  Procedures (including critical care time) Labs Review Labs Reviewed  CBC - Abnormal; Notable for the following:    WBC 12.4 (*)    All other components within normal limits  COMPREHENSIVE METABOLIC PANEL - Abnormal; Notable for the following:    Glucose, Bld 108 (*)  All other components within normal limits  SALICYLATE LEVEL - Abnormal; Notable for the following:    Salicylate Lvl <2.0 (*)    All other components within normal limits  URINE RAPID DRUG SCREEN (HOSP PERFORMED) - Abnormal; Notable for the following:    Benzodiazepines POSITIVE (*)    Tetrahydrocannabinol POSITIVE (*)    All other components within normal limits  ACETAMINOPHEN LEVEL  ETHANOL    Imaging Review No results found.   EKG Interpretation None      MDM   Final diagnoses:  Opiate withdrawal  Bilateral low back pain without sciatica  ANXIETY DEPRESSION   [name] presents with symptoms of opiate withdrawal. He has been taking Librium, Klonopin, Catapres, Imodium, Robaxin and Phenergan at home without relief of the symptoms. He is complaining of significant back pain.  On exam patient is  uncomfortable without diaphoresis. No vomiting here in the emergency department. We'll give fluids, Valium and reassess.  Discussed with Heloise Purpura of [facility] who reports that pt can be admitted to the observation unit at [place] after he is medically cleared here in the ED.    12:38 AM Pt reports persistent back pain, but is feeling better with the other medications.  Pt ambulates with pain, but without gait disturbance.  Labs are reassuring and pt is medically cleared to be transferred to [facility].    BP 105/71  Pulse 63  Temp(Src) 98.5 F (36.9 C) (Oral)  Resp 20  Ht 6\' 1"  (1.854 m)  Wt 175 lb (79.379 kg)  BMI 23.09 kg/m2  SpO2 98%   Abigail Butts, PA-C 06/22/14 0041

## 2014-06-22 ENCOUNTER — Emergency Department (EMERGENCY_DEPARTMENT_HOSPITAL)
Admission: EM | Admit: 2014-06-22 | Discharge: 2014-06-22 | Disposition: A | Discharge status: Other Acute Inpatient Hospital | Payer: BC Managed Care – PPO | Source: Home / Self Care | Attending: Emergency Medicine | Admitting: Emergency Medicine

## 2014-06-22 ENCOUNTER — Inpatient Hospital Stay (HOSPITAL_COMMUNITY)
Admission: AD | Admit: 2014-06-22 | Discharge: 2014-06-25 | DRG: 897 | Disposition: A | Discharge status: Home / Self Care | Payer: BC Managed Care – PPO | Source: Intra-hospital | Attending: Psychiatry | Admitting: Psychiatry

## 2014-06-22 ENCOUNTER — Encounter (HOSPITAL_COMMUNITY): Payer: Self-pay | Admitting: Emergency Medicine

## 2014-06-22 ENCOUNTER — Ambulatory Visit (HOSPITAL_COMMUNITY)
Admission: AD | Admit: 2014-06-22 | Discharge: 2014-06-22 | Disposition: A | Discharge status: Home / Self Care | Payer: BC Managed Care – PPO | Source: Intra-hospital | Attending: Psychiatry | Admitting: Psychiatry

## 2014-06-22 DIAGNOSIS — F1920 Other psychoactive substance dependence, uncomplicated: Principal | ICD-10-CM | POA: Diagnosis present

## 2014-06-22 DIAGNOSIS — Z872 Personal history of diseases of the skin and subcutaneous tissue: Secondary | ICD-10-CM

## 2014-06-22 DIAGNOSIS — Z8781 Personal history of (healed) traumatic fracture: Secondary | ICD-10-CM

## 2014-06-22 DIAGNOSIS — F32A Depression, unspecified: Secondary | ICD-10-CM

## 2014-06-22 DIAGNOSIS — F332 Major depressive disorder, recurrent severe without psychotic features: Secondary | ICD-10-CM | POA: Diagnosis present

## 2014-06-22 DIAGNOSIS — I10 Essential (primary) hypertension: Secondary | ICD-10-CM | POA: Diagnosis present

## 2014-06-22 DIAGNOSIS — Z87891 Personal history of nicotine dependence: Secondary | ICD-10-CM

## 2014-06-22 DIAGNOSIS — F419 Anxiety disorder, unspecified: Secondary | ICD-10-CM

## 2014-06-22 DIAGNOSIS — Z818 Family history of other mental and behavioral disorders: Secondary | ICD-10-CM

## 2014-06-22 DIAGNOSIS — F329 Major depressive disorder, single episode, unspecified: Secondary | ICD-10-CM

## 2014-06-22 DIAGNOSIS — F1210 Cannabis abuse, uncomplicated: Secondary | ICD-10-CM | POA: Diagnosis present

## 2014-06-22 DIAGNOSIS — G4700 Insomnia, unspecified: Secondary | ICD-10-CM | POA: Diagnosis present

## 2014-06-22 DIAGNOSIS — Z8249 Family history of ischemic heart disease and other diseases of the circulatory system: Secondary | ICD-10-CM

## 2014-06-22 DIAGNOSIS — F411 Generalized anxiety disorder: Secondary | ICD-10-CM

## 2014-06-22 DIAGNOSIS — F3289 Other specified depressive episodes: Secondary | ICD-10-CM | POA: Insufficient documentation

## 2014-06-22 DIAGNOSIS — F1120 Opioid dependence, uncomplicated: Secondary | ICD-10-CM | POA: Insufficient documentation

## 2014-06-22 DIAGNOSIS — F19939 Other psychoactive substance use, unspecified with withdrawal, unspecified: Secondary | ICD-10-CM | POA: Insufficient documentation

## 2014-06-22 DIAGNOSIS — F410 Panic disorder [episodic paroxysmal anxiety] without agoraphobia: Secondary | ICD-10-CM

## 2014-06-22 DIAGNOSIS — F341 Dysthymic disorder: Secondary | ICD-10-CM

## 2014-06-22 DIAGNOSIS — F1320 Sedative, hypnotic or anxiolytic dependence, uncomplicated: Secondary | ICD-10-CM | POA: Diagnosis present

## 2014-06-22 DIAGNOSIS — Z79899 Other long term (current) drug therapy: Secondary | ICD-10-CM | POA: Insufficient documentation

## 2014-06-22 DIAGNOSIS — F1994 Other psychoactive substance use, unspecified with psychoactive substance-induced mood disorder: Secondary | ICD-10-CM

## 2014-06-22 DIAGNOSIS — F1123 Opioid dependence with withdrawal: Secondary | ICD-10-CM

## 2014-06-22 DIAGNOSIS — R45851 Suicidal ideations: Secondary | ICD-10-CM

## 2014-06-22 DIAGNOSIS — F1021 Alcohol dependence, in remission: Secondary | ICD-10-CM

## 2014-06-22 DIAGNOSIS — F1910 Other psychoactive substance abuse, uncomplicated: Secondary | ICD-10-CM

## 2014-06-22 HISTORY — DX: Other difficulties with micturition: R39.198

## 2014-06-22 LAB — RAPID URINE DRUG SCREEN, HOSP PERFORMED
AMPHETAMINES: NOT DETECTED
BARBITURATES: NOT DETECTED
Benzodiazepines: POSITIVE — AB
Cocaine: NOT DETECTED
Opiates: NOT DETECTED
Tetrahydrocannabinol: POSITIVE — AB

## 2014-06-22 MED ORDER — CHLORDIAZEPOXIDE HCL 25 MG PO CAPS
25.0000 mg | ORAL_CAPSULE | Freq: Once | ORAL | Status: DC
  Filled 2014-06-22: qty 1

## 2014-06-22 MED ORDER — CLONAZEPAM 1 MG PO TABS
1.0000 mg | ORAL_TABLET | Freq: Three times a day (TID) | ORAL | Status: DC
  Administered 2014-06-23: 2 mg via ORAL
  Filled 2014-06-22 (×2): qty 1

## 2014-06-22 MED ORDER — TRAZODONE HCL 50 MG PO TABS
50.0000 mg | ORAL_TABLET | Freq: Every evening | ORAL | Status: DC | PRN
  Administered 2014-06-23: 50 mg via ORAL
  Filled 2014-06-22 (×4): qty 1

## 2014-06-22 MED ORDER — IBUPROFEN 800 MG PO TABS
800.0000 mg | ORAL_TABLET | Freq: Once | ORAL | Status: DC
  Filled 2014-06-22: qty 1

## 2014-06-22 MED ORDER — CHLORDIAZEPOXIDE HCL 25 MG PO CAPS
25.0000 mg | ORAL_CAPSULE | Freq: Four times a day (QID) | ORAL | Status: DC
  Administered 2014-06-22 (×2): 25 mg via ORAL
  Filled 2014-06-22 (×2): qty 1

## 2014-06-22 MED ORDER — HYDROXYZINE HCL 25 MG PO TABS
25.0000 mg | ORAL_TABLET | Freq: Four times a day (QID) | ORAL | Status: DC | PRN
  Administered 2014-06-22: 25 mg via ORAL

## 2014-06-22 MED ORDER — ACETAMINOPHEN 325 MG PO TABS: 650.0000 mg | ORAL_TABLET | Freq: Four times a day (QID) | ORAL | Status: DC | PRN

## 2014-06-22 MED ORDER — CHLORDIAZEPOXIDE HCL 25 MG PO CAPS: 25.0000 mg | ORAL_CAPSULE | Freq: Three times a day (TID) | ORAL | Status: DC | PRN

## 2014-06-22 MED ORDER — MAGNESIUM HYDROXIDE 400 MG/5ML PO SUSP: 30.0000 mL | Freq: Every day | ORAL | Status: DC | PRN

## 2014-06-22 MED ORDER — HYDROXYZINE HCL 25 MG PO TABS
25.0000 mg | ORAL_TABLET | Freq: Four times a day (QID) | ORAL | Status: DC | PRN
  Administered 2014-06-23 – 2014-06-24 (×2): 25 mg via ORAL
  Filled 2014-06-22 (×2): qty 1

## 2014-06-22 MED ORDER — DICYCLOMINE HCL 20 MG PO TABS
20.0000 mg | ORAL_TABLET | Freq: Four times a day (QID) | ORAL | Status: DC | PRN
  Administered 2014-06-22: 20 mg via ORAL
  Filled 2014-06-22: qty 1

## 2014-06-22 MED ORDER — HYDROXYZINE HCL 25 MG PO TABS
25.0000 mg | ORAL_TABLET | Freq: Four times a day (QID) | ORAL | Status: DC | PRN
  Filled 2014-06-22: qty 1

## 2014-06-22 MED ORDER — ONDANSETRON 4 MG PO TBDP
4.0000 mg | ORAL_TABLET | Freq: Four times a day (QID) | ORAL | Status: DC | PRN
  Administered 2014-06-24 (×2): 4 mg via ORAL
  Filled 2014-06-22 (×2): qty 1

## 2014-06-22 MED ORDER — CLONIDINE HCL 0.1 MG PO TABS: 0.1000 mg | ORAL_TABLET | ORAL | Status: DC

## 2014-06-22 MED ORDER — NAPROXEN 500 MG PO TABS
500.0000 mg | ORAL_TABLET | Freq: Two times a day (BID) | ORAL | Status: DC | PRN
  Administered 2014-06-23 – 2014-06-24 (×2): 500 mg via ORAL
  Filled 2014-06-22 (×3): qty 1

## 2014-06-22 MED ORDER — CLONIDINE HCL 0.1 MG PO TABS: 0.1000 mg | ORAL_TABLET | Freq: Every day | ORAL | Status: DC

## 2014-06-22 MED ORDER — THIAMINE HCL 100 MG/ML IJ SOLN
100.0000 mg | Freq: Once | INTRAMUSCULAR | Status: AC
  Administered 2014-06-22: 100 mg via INTRAMUSCULAR
  Filled 2014-06-22: qty 2

## 2014-06-22 MED ORDER — LOPERAMIDE HCL 2 MG PO CAPS: 2.0000 mg | ORAL_CAPSULE | ORAL | Status: DC | PRN

## 2014-06-22 MED ORDER — CLONIDINE HCL 0.1 MG PO TABS
0.1000 mg | ORAL_TABLET | Freq: Every day | ORAL | Status: DC
  Filled 2014-06-22: qty 1

## 2014-06-22 MED ORDER — CHLORDIAZEPOXIDE HCL 25 MG PO CAPS: 25.0000 mg | ORAL_CAPSULE | Freq: Every day | ORAL | Status: DC

## 2014-06-22 MED ORDER — CLONIDINE HCL 0.1 MG PO TABS
0.1000 mg | ORAL_TABLET | ORAL | Status: DC
  Administered 2014-06-25: 0.1 mg via ORAL
  Filled 2014-06-22 (×4): qty 1

## 2014-06-22 MED ORDER — CLONIDINE HCL 0.1 MG PO TABS
0.1000 mg | ORAL_TABLET | Freq: Four times a day (QID) | ORAL | Status: DC
  Administered 2014-06-22 (×2): 0.1 mg via ORAL
  Filled 2014-06-22 (×2): qty 1

## 2014-06-22 MED ORDER — CHLORDIAZEPOXIDE HCL 25 MG PO CAPS: 25.0000 mg | ORAL_CAPSULE | Freq: Three times a day (TID) | ORAL | Status: DC

## 2014-06-22 MED ORDER — ONDANSETRON 4 MG PO TBDP: 4.0000 mg | ORAL_TABLET | Freq: Four times a day (QID) | ORAL | Status: DC | PRN

## 2014-06-22 MED ORDER — METHOCARBAMOL 500 MG PO TABS
500.0000 mg | ORAL_TABLET | Freq: Once | ORAL | Status: AC
Start: 2014-06-22 — End: 2014-06-22
  Administered 2014-06-22: 500 mg via ORAL
  Filled 2014-06-22: qty 1

## 2014-06-22 MED ORDER — ONDANSETRON 4 MG PO TBDP
4.0000 mg | ORAL_TABLET | Freq: Once | ORAL | Status: AC
  Administered 2014-06-22: 4 mg via ORAL
  Filled 2014-06-22: qty 1

## 2014-06-22 MED ORDER — DIAZEPAM 5 MG/ML IJ SOLN
5.0000 mg | Freq: Once | INTRAMUSCULAR | Status: AC
  Administered 2014-06-22: 5 mg via INTRAVENOUS
  Filled 2014-06-22: qty 2

## 2014-06-22 MED ORDER — ALUM & MAG HYDROXIDE-SIMETH 200-200-20 MG/5ML PO SUSP: 30.0000 mL | ORAL | Status: DC | PRN

## 2014-06-22 MED ORDER — CHLORDIAZEPOXIDE HCL 25 MG PO CAPS: 25.0000 mg | ORAL_CAPSULE | Freq: Four times a day (QID) | ORAL | Status: DC | PRN

## 2014-06-22 MED ORDER — VITAMIN B-1 100 MG PO TABS: 100.0000 mg | ORAL_TABLET | Freq: Every day | ORAL | Status: DC

## 2014-06-22 MED ORDER — ADULT MULTIVITAMIN W/MINERALS CH
1.0000 | ORAL_TABLET | Freq: Every day | ORAL | Status: DC
  Administered 2014-06-22: 1 via ORAL
  Filled 2014-06-22: qty 1

## 2014-06-22 MED ORDER — METHOCARBAMOL 500 MG PO TABS: 500.0000 mg | ORAL_TABLET | Freq: Three times a day (TID) | ORAL | Status: DC | PRN

## 2014-06-22 MED ORDER — NAPROXEN 500 MG PO TABS
500.0000 mg | ORAL_TABLET | Freq: Two times a day (BID) | ORAL | Status: DC | PRN
  Filled 2014-06-22: qty 1

## 2014-06-22 MED ORDER — DICYCLOMINE HCL 20 MG PO TABS
20.0000 mg | ORAL_TABLET | Freq: Four times a day (QID) | ORAL | Status: DC | PRN
  Administered 2014-06-24: 20 mg via ORAL
  Filled 2014-06-22: qty 1

## 2014-06-22 MED ORDER — CHLORDIAZEPOXIDE HCL 25 MG PO CAPS: 25.0000 mg | ORAL_CAPSULE | ORAL | Status: DC

## 2014-06-22 MED ORDER — METHOCARBAMOL 500 MG PO TABS
500.0000 mg | ORAL_TABLET | Freq: Three times a day (TID) | ORAL | Status: DC | PRN
  Administered 2014-06-24 (×2): 500 mg via ORAL
  Filled 2014-06-22 (×2): qty 1

## 2014-06-22 MED ORDER — CLONIDINE HCL 0.1 MG PO TABS
0.1000 mg | ORAL_TABLET | Freq: Four times a day (QID) | ORAL | Status: AC
  Administered 2014-06-23 – 2014-06-24 (×8): 0.1 mg via ORAL
  Filled 2014-06-22 (×10): qty 1

## 2014-06-22 NOTE — BH Assessment (Signed)
Tele Assessment Note   [name] is an 33 y.o. male, married, caucasian who was transported to [place] by [employer] for admission to [facility] observation unit. Pt was accompanied by his wife, who participated in assessment at the Pt's request. Pt was not assessed by TTS at [facility], TTS was not informed of admission and there was no voluntary consent for treatment signed. Pt became angry and refused to sign voluntary consent for treatment when told he was being admitted to an observation unit and not inpatient. This LPC completed assessment and discussed [facility] treatment program with Pt.  Pt reports low back injury that did not improve with therapy and pain was treated with Fentanyl 154mcg. He is currently in outpatient psychiatric treatment with Dr. Marquis Lunch being treated for anxiety and depression. Pt states he has a bad reaction to "every" antidepressant and "I can't take any kind of antidepressant at all." Pt states he was going to a pain clinic but was discharged because he takes benzodiazepines, which he says he needs for anxiety. Pt reports he was weaning himself off the fentanyl patches and was able to get to 58mcg and has stayed there since, but was continuing to have pain. Pt reports he ran out completely at 5pm 06/20/14. He reports current extreme pain in his back with current pain level 10/10. Pt reports he is withdrawing from Fentanyl with symptoms including body aches, irritability, stomach discomfort and chills. Pt reports taking both clonidine and klonopin to help with withdrawal symptoms in addition to the robaxin and librium and all these medications came from Dr. Toy Care to help him detox.   Pt denies current suicidal ideation or history of suicidal gestures. He denies homicidal ideation or history of violence. Pt denies any psychotic symptoms. Pt denies abuse of alcohol or cocaine but reports regular marijuana use.  Pt is dressed in shorts and t-shirt and is alert,  oriented x4 with normal speech and normal motor behavior. Eye contact is fair. Pt's mood is depressed, anxious, angry and irritable and affect is congruent with mood. Thought process is coherent and relevant. There is no indication Pt is currently responding to internal stimuli or experiencing delusional thought content. Pt's wife expresses no concerns for Pt's safety other than if Pt returns home he will continue to experience withdrawal.    Axis I: Opioid Use Disorder, Severe; Cannabis Use Disorder; Rule Out Benzodiazepine Use Disorder Axis II: Deferred Axis III:  Past Medical History  Diagnosis Date  . Tension headache   . Anxiety and depression   . Alcohol abuse   . Skull fracture 2004    Trauma  . Pilonidal cyst    Axis IV: problems with access to health care services Axis V: GAF=45  Past Medical History:  Past Medical History  Diagnosis Date  . Tension headache   . Anxiety and depression   . Alcohol abuse   . Skull fracture 2004    Trauma  . Pilonidal cyst     Past Surgical History  Procedure Laterality Date  . Vasectomy  04/2010  . Pilonidal cystectomy  08/01/2011  . Orif right 3rd finger  2012    Dr. Fredna Dow - see hospital notes.  . Radiofrequency ablation nerves  1/7/214    Family History:  Family History  Problem Relation Age of Onset  . Depression Mother   . Anxiety disorder Mother   . Alcohol abuse Mother   . Hypertension Father   . Hyperlipidemia Father   .  Coronary artery disease Neg Hx   . Prostate cancer Neg Hx   . Colon cancer Neg Hx   . Diabetes Neg Hx   . Cancer Paternal Aunt     breast    Social History:  reports that he has quit smoking. His smoking use included Cigarettes. He smoked 0.00 packs per day. He has never used smokeless tobacco. He reports that he uses illicit drugs (Marijuana). He reports that he does not drink alcohol.  Additional Social History:  Alcohol / Drug Use Pain Medications: Pt abusing Fentanyl Prescriptions: Pt abusing  Fentanyl Over the Counter: Denies abuse History of alcohol / drug use?: Yes Longest period of sobriety (when/how long): unknown Withdrawal Symptoms: Cramps;Irritability;Agitation;Tremors;Fever / Chills Substance #1 Name of Substance 1: Fentanyl 1 - Age of First Use: 31 1 - Amount (size/oz): 100 mcg 1 - Frequency: daily 1 - Duration: unknown 1 - Last Use / Amount: 06/20/14  CIWA:   COWS:    Allergies:  Allergies  Allergen Reactions  . Avelox [Moxifloxacin Hcl In Nacl] Nausea And Vomiting  . Fluoxetine     Intolerant of all SSRI's, NE drugs  . Moxifloxacin Nausea And Vomiting    Home Medications:  (Not in a hospital admission)  OB/GYN Status:  No LMP for male patient.  General Assessment Data Location of Assessment: [facility] Is this a Tele or Face-to-Face Assessment?: Face-to-Face Is this an Initial Assessment or a Re-assessment for this encounter?: Initial Assessment Living Arrangements: Spouse/significant other Can pt return to current living arrangement?: Yes Admission Status: Voluntary Is patient capable of signing voluntary admission?: Yes Transfer from: [facility] Referral Source: MD  Medical Screening Exam ([place]) Medical Exam completed: No Reason for MSE not completed: Patient Refused (Pt signed MSE refusal form)  [place] Living Arrangements: Spouse/significant other Name of Psychiatrist: Marquis Lunch, MD Name of Therapist: None  Education Status Is patient currently in school?: No Current Grade: NA Highest grade of school patient has completed: NA Name of school: NA Contact person: NA  Risk to self Suicidal Ideation: No Suicidal Intent: No Is patient at risk for suicide?: No Suicidal Plan?: No Access to Means: No What has been your use of drugs/alcohol within the last 12 months?: Pt using Fentanyl, benzodiazepines and marijuana Previous Attempts/Gestures: No How many times?: 0 Other Self Harm Risks:  None Triggers for Past Attempts: None known Intentional Self Injurious Behavior: None Family Suicide History: Unknown Recent stressful life event(s): Recent negative physical changes Persecutory voices/beliefs?: No Depression: Yes Depression Symptoms: Despondent;Insomnia;Fatigue;Loss of interest in usual pleasures;Feeling angry/irritable Substance abuse history and/or treatment for substance abuse?: No Suicide prevention information given to non-admitted patients: Not applicable (Pt refused)  Risk to Others Homicidal Ideation: No Thoughts of Harm to Others: No Current Homicidal Intent: No Current Homicidal Plan: No Access to Homicidal Means: No Identified Victim: None History of harm to others?: No Assessment of Violence: None Noted Violent Behavior Description: None Does patient have access to weapons?: No Criminal Charges Pending?: No Does patient have a court date: No  Psychosis Hallucinations: None noted Delusions: None noted  Mental Status Report Appear/Hygiene: Other (Comment) (T-shirt and shorts) Eye Contact: Fair Motor Activity: Unremarkable Speech: Logical/coherent Level of Consciousness: Alert Mood: Irritable;Angry;Other (Comment) (Frustrated) Affect: Irritable Anxiety Level: Panic Attacks Panic attack frequency: daily Most recent panic attack: today Thought Processes: Coherent;Relevant Judgement: Partial Orientation: Person;Place;Time;Situation Obsessive Compulsive Thoughts/Behaviors: None  Cognitive Functioning Concentration: Decreased Memory: Recent Intact;Remote Intact IQ: Average Insight: Fair Impulse  Control: Good Appetite: Good Weight Loss: 0 Weight Gain: 0 Sleep: Decreased Total Hours of Sleep: 5 Vegetative Symptoms: None  ADLScreening [facility]) Patient's cognitive ability adequate to safely complete daily activities?: Yes Patient able to express need for assistance with ADLs?: Yes Independently performs ADLs?: Yes  (appropriate for developmental age)  Prior Inpatient Therapy Prior Inpatient Therapy: No Prior Therapy Dates: NA Prior Therapy Facilty/Provider(s): NA Reason for Treatment: NA  Prior Outpatient Therapy Prior Outpatient Therapy: Yes Prior Therapy Dates: Current Prior Therapy Facilty/Provider(s): Dr. Marquis Lunch Reason for Treatment: Anxiety  ADL Screening (condition at time of admission) Patient's cognitive ability adequate to safely complete daily activities?: Yes Is the patient deaf or have difficulty hearing?: No Does the patient have difficulty seeing, even when wearing glasses/contacts?: No Does the patient have difficulty concentrating, remembering, or making decisions?: No Patient able to express need for assistance with ADLs?: Yes Does the patient have difficulty dressing or bathing?: No Independently performs ADLs?: Yes (appropriate for developmental age) Does the patient have difficulty walking or climbing stairs?: No Weakness of Legs: None Weakness of Arms/Hands: None  Home Assistive Devices/Equipment Home Assistive Devices/Equipment: None          Advance Directives (For Healthcare) Advance Directive: Patient does not have advance directive;Patient would not like information Pre-existing out of facility DNR order (yellow form or pink MOST form): No Nutrition Screen- [facility] Adult/WL/AP Patient's home diet: Regular  Additional Information 1:1 In Past 12 Months?: No CIRT Risk: No Elopement Risk: No Does patient have medical clearance?: Yes     Disposition: Pt was transported to [facility] by [employer] to [facility] without assessment by TTS and without voluntary consent for treatment. Per note by Abigail Butts, PA-C at [facility]: "Discussed with Heloise Purpura of [facility] who reports that pt can be admitted to the observation unit at [place] after he is medically cleared here in the ED. Pt reports persistent back pain, but is feeling better with the other  medications. Pt ambulates with pain, but without gait disturbance. Labs are reassuring and pt is medically cleared to be transferred to [facility]." TTS attempted to contact Catalina Pizza, NP but he was already off call.   Pt reports he was "lied to" by staff at [facility] who he states: 1) didn't explain he was being admitted to an observation unit and not an inpatient unit, 2) didn't tell him he could not have his own medications, 3) told him his wife could visit any time during the day, and 4) didn't explain he had to share a bathroom with other patients when at [facility]. This LPC and [facility] AC, Lavell Luster, spoke to Pt at length about the benefits and limitations of treatment at [facility] and offered to discuss admission to [facility] inpatient program with psychiatrist. After explaining in detail the treatment program, Pt refused treatment, contracted for safety, signed MSE refusal form and left the facility accompanied by his wife, who had brought her car. Pt stated he would follow up with current outpatient providers.  Disposition Initial Assessment Completed for this Encounter: Yes Disposition of Patient: Other dispositions Other disposition(s): Other (Comment) (Pt refused treatment)  Evelena Peat, [facility], [facility] Triage Specialist [phone]   Evelena Peat 06/22/2014 3:33 AM

## 2014-06-22 NOTE — Discharge Instructions (Signed)
Transfer to [facility] Observation unit

## 2014-06-22 NOTE — ED Notes (Addendum)
Patient was at [facility] yesterday and left AMA because his anxiety would not let him stay there. He states he needs to be here to recover. Patient denies SI.

## 2014-06-22 NOTE — ED Notes (Signed)
Pt sleeping comfortably, rise and fall of chest observed, breathing even and unlabored. Pt is in NAD. Will recheck vitals when pt is awake.

## 2014-06-22 NOTE — ED Provider Notes (Signed)
CSN: [id number]     Arrival date & time 06/22/14  1156 History  This chart was scribed for non-physician practitioner, Montine Circle, PA-C working with Orlie Dakin, MD by Frederich Balding, ED scribe. This patient was seen in room WTR4/WLPT4 and the patient's care was started at 1:01 PM.    Chief Complaint  Patient presents with  . Hallucinations  . Anxiety  . Withdrawal    fentanyl    The history is provided by the patient. No language interpreter was used.   HPI Comments: [name] is a 33 y.o. male who presents to the Emergency Department for medical clearance. Pt was evaluated here yesterday for withdrawals from prescribed fentanyl and was sent to [facility]. He left AMA because it wasn't like he was told it would be so he got anxious. Pt did not use any fentanyl when he returned home. States he is here today to go back through the process and return to [facility]. Denies SI/HI.   Past Medical History  Diagnosis Date  . Tension headache   . Anxiety and depression   . Alcohol abuse   . Skull fracture 2004    Trauma  . Pilonidal cyst    Past Surgical History  Procedure Laterality Date  . Vasectomy  04/2010  . Pilonidal cystectomy  08/01/2011  . Orif right 3rd finger  2012    Dr. Fredna Dow - see hospital notes.  . Radiofrequency ablation nerves  1/7/214   Family History  Problem Relation Age of Onset  . Depression Mother   . Anxiety disorder Mother   . Alcohol abuse Mother   . Hypertension Father   . Hyperlipidemia Father   . Coronary artery disease Neg Hx   . Prostate cancer Neg Hx   . Colon cancer Neg Hx   . Diabetes Neg Hx   . Cancer Paternal Aunt     breast   History  Substance Use Topics  . Smoking status: Former Smoker    Types: Cigarettes  . Smokeless tobacco: Never Used     Comment: quit 2 or more years  . Alcohol Use: No    Review of Systems  Constitutional: Negative for fever.  HENT: Negative for congestion.   Eyes: Negative for redness.  Respiratory: Negative  for shortness of breath.   Cardiovascular: Negative for chest pain.  Gastrointestinal: Negative for abdominal distention.  Musculoskeletal: Negative for gait problem.  Skin: Negative for rash.  Neurological: Negative for speech difficulty.  Psychiatric/Behavioral: Negative for suicidal ideas. The patient is nervous/anxious.   All other systems reviewed and are negative.  Allergies  Avelox; Fluoxetine; and Moxifloxacin  Home Medications   Prior to Admission medications   Medication Sig Start Date End Date Taking? Authorizing Provider  chlordiazePOXIDE (LIBRIUM) 25 MG capsule Take 25 mg by mouth 3 (three) times daily as needed for anxiety.    Historical Provider, MD  clonazePAM (KLONOPIN) 1 MG tablet Take 1-2 mg by mouth 3 (three) times daily.    Historical Provider, MD  cloNIDine (CATAPRES) 0.1 MG tablet Take 0.1 mg by mouth 4 (four) times daily.    Historical Provider, MD  loperamide (IMODIUM) 2 MG capsule Take 2 mg by mouth as needed for diarrhea or loose stools (diarrhea).    Historical Provider, MD  methocarbamol (ROBAXIN) 500 MG tablet Take 500 mg by mouth every 8 (eight) hours as needed for muscle spasms (muscle spasm).    Historical Provider, MD  promethazine (PHENERGAN) 25 MG tablet Take 25 mg by  mouth every 6 (six) hours as needed for nausea or vomiting (nausea).    Historical Provider, MD   BP 116/70  Pulse 89  Temp(Src) 98 F (36.7 C) (Oral)  Resp 18  SpO2 98%  Physical Exam  Nursing note and vitals reviewed. Constitutional: He is oriented to person, place, and time. He appears well-developed and well-nourished. No distress.  HENT:  Head: Normocephalic and atraumatic.  Eyes: Conjunctivae and EOM are normal. Pupils are equal, round, and reactive to light. Right eye exhibits no discharge. Left eye exhibits no discharge. No scleral icterus.  Neck: Normal range of motion. Neck supple. No JVD present.  Cardiovascular: Normal rate, regular rhythm and normal heart sounds.   Exam reveals no gallop and no friction rub.   No murmur heard. Pulmonary/Chest: Effort normal and breath sounds normal. No stridor. No respiratory distress. He has no wheezes. He has no rales. He exhibits no tenderness.  Abdominal: Soft. He exhibits no distension and no mass. There is no tenderness. There is no rebound and no guarding.  Musculoskeletal: Normal range of motion. He exhibits no edema and no tenderness.  Neurological: He is alert and oriented to person, place, and time.  Skin: Skin is warm and dry.  Psychiatric: He has a normal mood and affect. His behavior is normal. Judgment and thought content normal. He expresses no homicidal and no suicidal ideation.    ED Course  Procedures (including critical care time)  DIAGNOSTIC STUDIES: Oxygen Saturation is 98% on RA, normal by my interpretation.    COORDINATION OF CARE: 1:04 PM-Discussed treatment plan which includes speaking with a behavioral health specialist with pt at bedside and pt agreed to plan.   Labs Review Labs Reviewed - No data to display  Imaging Review No results found.   EKG Interpretation None      MDM   Final diagnoses:  Benzodiazepine dependence  Anxiety  Depression  Opioid dependence with withdrawal    TTS consult.  Medically clear now.  Treatment Plan Summary: Per Psych NP Daily contact with patient to assess and evaluate symptoms and progress in treatment  Medication management  Inpatient hospitalization for stabilization.    I personally performed the services described in this documentation, which was scribed in my presence. The recorded information has been reviewed and is accurate.  Montine Circle, PA-C 06/22/14 1729

## 2014-06-22 NOTE — BHH Counselor (Signed)
Pt has been accepted to be [facility] bed 304-2. Per Claiborne Billings the [facility].  Lear Ng, [facility] Triage Specialist 06/22/2014 7:32 PM

## 2014-06-22 NOTE — ED Provider Notes (Signed)
Pt was inpt at [facility] until he left this morning . Requesting detox from fetanyl. Also usiing klonipin prescribed to him. Pt alert, gcs 15 appears mildly anxious  Orlie Dakin, MD 06/22/14 1557

## 2014-06-22 NOTE — Consult Note (Signed)
[facility] Consult   Reason for Consult:  Benzo, morphine dependence Referring Physician:  EDP  [name] is an 33 y.o. male. Total Time spent with patient: 20 minutes  Assessment: AXIS I:  Anxiety Disorder NOS, Depressive Disorder NOS, Panic Disorder and Substance Abuse AXIS II:  Deferred AXIS III:   Past Medical History  Diagnosis Date  . Tension headache   . Anxiety and depression   . Alcohol abuse   . Skull fracture 2004    Trauma  . Pilonidal cyst    AXIS IV:  other psychosocial or environmental problems, problems related to social environment and problems with primary support group AXIS V:  21-30 behavior considerably influenced by delusions or hallucinations OR serious impairment in judgment, communication OR inability to function in almost all areas  Plan:  Recommend psychiatric Inpatient admission when medically cleared.  Subjective:   [name] is a 33 y.o. male patient admitted with benzo and opiate dependency, depression, suicidal.  HPI:  Patient requests detox from opiates. Given morphine 71m patches. Last patch was Saturday morning for lower back pain. He has a past history of alcohol dependence. Taking 676mof klonopin daily and 2521mf librium daily. He has had bad reactions to all anti-depressants. He reports depression with suicidal thoughts due to constant pain. Denies any hallucinations and homicidal ideation. Denies alcohol use.  HPI Elements:   Location:  Generalized. Quality:  Acute. Severity:  Severe. Timing:  Constant. Duration:  weeks. Context:  stressors.  Past Psychiatric History: Past Medical History  Diagnosis Date  . Tension headache   . Anxiety and depression   . Alcohol abuse   . Skull fracture 2004    Trauma  . Pilonidal cyst     reports that he has quit smoking. His smoking use included Cigarettes. He smoked 0.00 packs per day. He has never used smokeless tobacco. He reports that he uses illicit drugs (Marijuana).  He reports that he does not drink alcohol. Family History  Problem Relation Age of Onset  . Depression Mother   . Anxiety disorder Mother   . Alcohol abuse Mother   . Hypertension Father   . Hyperlipidemia Father   . Coronary artery disease Neg Hx   . Prostate cancer Neg Hx   . Colon cancer Neg Hx   . Diabetes Neg Hx   . Cancer Paternal Aunt     breast           Allergies:   Allergies  Allergen Reactions  . Avelox [Moxifloxacin Hcl In Nacl] Nausea And Vomiting  . Fluoxetine     Intolerant of all SSRI's, NE drugs  . Moxifloxacin Nausea And Vomiting    ACT Assessment Complete:  Yes:    Educational Status    Risk to Self: Risk to self Is patient at risk for suicide?: No Substance abuse history and/or treatment for substance abuse?: No  Risk to Others:    Abuse:    Prior Inpatient Therapy:    Prior Outpatient Therapy:    Additional Information:                    Objective: Blood pressure 116/70, pulse 89, temperature 98 F (36.7 C), temperature source Oral, resp. rate 18, SpO2 98.00%.There is no weight on file to calculate BMI. Results for orders placed during the hospital encounter of 06/21/14 (from the past 72 hour(s))  ACETAMINOPHEN LEVEL     Status: None   Collection Time  06/21/14 10:08 PM      Result Value Ref Range   Acetaminophen (Tylenol), Serum <15.0  10 - 30 ug/mL   Comment:            THERAPEUTIC CONCENTRATIONS VARY     SIGNIFICANTLY. A RANGE OF 10-30     ug/mL MAY BE AN EFFECTIVE     CONCENTRATION FOR MANY PATIENTS.     HOWEVER, SOME ARE BEST TREATED     AT CONCENTRATIONS OUTSIDE THIS     RANGE.     ACETAMINOPHEN CONCENTRATIONS     >150 ug/mL AT 4 HOURS AFTER     INGESTION AND >50 ug/mL AT 12     HOURS AFTER INGESTION ARE     OFTEN ASSOCIATED WITH TOXIC     REACTIONS.  CBC     Status: Abnormal   Collection Time    06/21/14 10:08 PM      Result Value Ref Range   WBC 12.4 (*) 4.0 - 10.5 K/uL   RBC 5.29  4.22 - 5.81 MIL/uL    Hemoglobin 15.2  13.0 - 17.0 g/dL   HCT 44.2  39.0 - 52.0 %   MCV 83.6  78.0 - 100.0 fL   MCH 28.7  26.0 - 34.0 pg   MCHC 34.4  30.0 - 36.0 g/dL   RDW 13.0  11.5 - 15.5 %   Platelets 359  150 - 400 K/uL  COMPREHENSIVE METABOLIC PANEL     Status: Abnormal   Collection Time    06/21/14 10:08 PM      Result Value Ref Range   Sodium 145  137 - 147 mEq/L   Potassium 4.0  3.7 - 5.3 mEq/L   Chloride 105  96 - 112 mEq/L   CO2 25  19 - 32 mEq/L   Glucose, Bld 108 (*) 70 - 99 mg/dL   BUN 11  6 - 23 mg/dL   Creatinine, Ser 0.83  0.50 - 1.35 mg/dL   Calcium 10.4  8.4 - 10.5 mg/dL   Total Protein 8.0  6.0 - 8.3 g/dL   Albumin 4.2  3.5 - 5.2 g/dL   AST 26  0 - 37 U/L   ALT 49  0 - 53 U/L   Alkaline Phosphatase 81  39 - 117 U/L   Total Bilirubin 0.3  0.3 - 1.2 mg/dL   GFR calc non Af Amer >90  >90 mL/min   GFR calc Af Amer >90  >90 mL/min   Comment: (NOTE)     The eGFR has been calculated using the CKD EPI equation.     This calculation has not been validated in all clinical situations.     eGFR's persistently <90 mL/min signify possible Chronic Kidney     Disease.   Anion gap 15  5 - 15  ETHANOL     Status: None   Collection Time    06/21/14 10:08 PM      Result Value Ref Range   Alcohol, Ethyl (B) <11  0 - 11 mg/dL   Comment:            LOWEST DETECTABLE LIMIT FOR     SERUM ALCOHOL IS 11 mg/dL     FOR MEDICAL PURPOSES ONLY  SALICYLATE LEVEL     Status: Abnormal   Collection Time    06/21/14 10:08 PM      Result Value Ref Range   Salicylate Lvl <6.2 (*) 2.8 - 20.0 mg/dL  URINE RAPID DRUG SCREEN (HOSP  PERFORMED)     Status: Abnormal   Collection Time    06/22/14 12:02 AM      Result Value Ref Range   Opiates NONE DETECTED  NONE DETECTED   Cocaine NONE DETECTED  NONE DETECTED   Benzodiazepines POSITIVE (*) NONE DETECTED   Amphetamines NONE DETECTED  NONE DETECTED   Tetrahydrocannabinol POSITIVE (*) NONE DETECTED   Barbiturates NONE DETECTED  NONE DETECTED   Comment:             DRUG SCREEN FOR MEDICAL PURPOSES     ONLY.  IF CONFIRMATION IS NEEDED     FOR ANY PURPOSE, NOTIFY LAB     WITHIN 5 DAYS.                LOWEST DETECTABLE LIMITS     FOR URINE DRUG SCREEN     Drug Class       Cutoff (ng/mL)     Amphetamine      1000     Barbiturate      200     Benzodiazepine   160     Tricyclics       737     Opiates          300     Cocaine          300     THC              50   Labs are reviewed and are pertinent for no medical issues noted.  Current Facility-Administered Medications  Medication Dose Route Frequency Provider Last Rate Last Dose  . cloNIDine (CATAPRES) tablet 0.1 mg  0.1 mg Oral QID Orlie Dakin, MD   0.1 mg at 06/22/14 1431   Followed by  . [START ON 06/24/2014] cloNIDine (CATAPRES) tablet 0.1 mg  0.1 mg Oral BH-qamhs Orlie Dakin, MD       Followed by  . [START ON 06/27/2014] cloNIDine (CATAPRES) tablet 0.1 mg  0.1 mg Oral QAC breakfast Orlie Dakin, MD      . dicyclomine (BENTYL) tablet 20 mg  20 mg Oral Q6H PRN Orlie Dakin, MD      . hydrOXYzine (ATARAX/VISTARIL) tablet 25 mg  25 mg Oral Q6H PRN Orlie Dakin, MD      . ibuprofen (ADVIL,MOTRIN) tablet 800 mg  800 mg Oral Once Montine Circle, PA-C      . loperamide (IMODIUM) capsule 2-4 mg  2-4 mg Oral PRN Orlie Dakin, MD      . methocarbamol (ROBAXIN) tablet 500 mg  500 mg Oral Q8H PRN Orlie Dakin, MD      . naproxen (NAPROSYN) tablet 500 mg  500 mg Oral BID PRN Orlie Dakin, MD      . ondansetron (ZOFRAN-ODT) disintegrating tablet 4 mg  4 mg Oral Q6H PRN Orlie Dakin, MD       Current Outpatient Prescriptions  Medication Sig Dispense Refill  . chlordiazePOXIDE (LIBRIUM) 25 MG capsule Take 25 mg by mouth 3 (three) times daily as needed for anxiety.      . clonazePAM (KLONOPIN) 1 MG tablet Take 1-2 mg by mouth 3 (three) times daily.      . cloNIDine (CATAPRES) 0.1 MG tablet Take 0.1 mg by mouth 4 (four) times daily.      Marland Kitchen loperamide (IMODIUM) 2 MG capsule Take 2 mg by  mouth as needed for diarrhea or loose stools (diarrhea).      . methocarbamol (ROBAXIN) 500 MG tablet Take 500 mg by  mouth every 8 (eight) hours as needed for muscle spasms (muscle spasm).      . promethazine (PHENERGAN) 25 MG tablet Take 25 mg by mouth every 6 (six) hours as needed for nausea or vomiting (nausea).        Psychiatric Specialty Exam:     Blood pressure 116/70, pulse 89, temperature 98 F (36.7 C), temperature source Oral, resp. rate 18, SpO2 98.00%.There is no weight on file to calculate BMI.  General Appearance: Disheveled  Eye Sport and exercise psychologist::  Fair  Speech:  Normal Rate  Volume:  Normal  Mood:  Depressed  Affect:  Congruent  Thought Process:  Coherent  Orientation:  Full (Time, Place, and Person)  Thought Content:  Rumination  Suicidal Thoughts:  Yes.  with intent/plan  Homicidal Thoughts:  No  Memory:  Immediate;   Fair Recent;   Fair Remote;   Fair  Judgement:  Fair  Insight:  Fair  Psychomotor Activity:  Normal  Concentration:  Fair  Recall:  [employer] of Knowledge:Fair  Language: Fair  Akathisia:  No  Handed:  Right  AIMS (if indicated):     Assets:  Desire for Improvement Financial Resources/Insurance Housing Resilience Social Support Transportation Vocational/Educational  Sleep:      Musculoskeletal: Strength & Muscle Tone: within normal limits Gait & Station: normal Patient leans: N/A  Treatment Plan Summary: Daily contact with patient to assess and evaluate symptoms and progress in treatment Medication management Inpatient hospitalization for stabilization.  Waylan Boga, [place] 06/22/2014 3:50 PM

## 2014-06-22 NOTE — ED Notes (Signed)
Report called to [facility], pt ready for transfer by [place]

## 2014-06-23 ENCOUNTER — Encounter (HOSPITAL_COMMUNITY): Payer: Self-pay | Admitting: *Deleted

## 2014-06-23 DIAGNOSIS — F410 Panic disorder [episodic paroxysmal anxiety] without agoraphobia: Secondary | ICD-10-CM

## 2014-06-23 DIAGNOSIS — F1120 Opioid dependence, uncomplicated: Secondary | ICD-10-CM | POA: Diagnosis present

## 2014-06-23 MED ORDER — CHLORDIAZEPOXIDE HCL 25 MG PO CAPS
25.0000 mg | ORAL_CAPSULE | Freq: Every day | ORAL | Status: DC
  Administered 2014-06-23 – 2014-06-24 (×2): 25 mg via ORAL
  Filled 2014-06-23 (×2): qty 1

## 2014-06-23 MED ORDER — CLONAZEPAM 1 MG PO TABS
2.0000 mg | ORAL_TABLET | Freq: Three times a day (TID) | ORAL | Status: DC
  Administered 2014-06-23 – 2014-06-25 (×7): 2 mg via ORAL
  Filled 2014-06-23 (×7): qty 2

## 2014-06-23 NOTE — B[facility] Suicide Risk Assessment (Signed)
[place] INPATIENT: Family/Significant Other Suicide Prevention Education  Suicide Prevention Education:  Education Completed; No one has been identified by the patient as the family member/significant other with whom the patient will be residing, and identified as the person(s) who will aid the patient in the event of a mental health crisis (suicidal ideations/suicide attempt).   Pt did not c/o SI at admission, nor have they endorsed SI during their stay here. SPE not required. SPI pamphlet provided to pt and he was encouraged to share information with support network, ask questions, and talk about any concerns relating to SPE.  [place], [place] 06/23/2014 9:03 AM

## 2014-06-23 NOTE — H&P (Signed)
Psychiatric Admission Assessment Adult  Patient Identification:  [name] Date of Evaluation:  06/23/2014 Chief Complaint:  BENZO DEPENDENCE History of Present Illness:: 33 Y/O male who states he was prescribed opioids for his back for a year straight. He took them every day. His doctor retired and new doctor asked him to stay off the Opioids. He was given 3 months prescription with instructions to wean off. Started dropping the Duragesic to 25 mg. Has been off the Duragesic for five days. Started withdrawing. States he was not able to do it at home. He "was an alcoholic" quit drinking October 12, 2008. He blacked  out after the last drink.  States his mother was an alcoholic and he took her to a rehab in [place]. States she ended up getting to use drugs and died in a "crack house" in [place]. Stil with recurrent thoughts about her. He admits he suffers from severe anxiety. He has not been able to tolerate any of the antidepressants he has been given. He was placed on Xanax up to 8 mg daily, and he is currently being detox on an outpatient basis. Wants help with his opioid detox Associated Signs/Synptoms: Depression Symptoms:  depressed mood, insomnia, anxiety, panic attacks, loss of energy/fatigue, disturbed sleep, decreased appetite, (Hypo) Manic Symptoms:  Denies Anxiety Symptoms:  Excessive Worry, Panic Symptoms, Social Anxiety, Psychotic Symptoms:  Denies PTSD Symptoms: Had a traumatic exposure:  death of mother Re-experiencing:  Intrusive Thoughts Total Time spent with patient: 45 minutes  Psychiatric Specialty Exam: Physical Exam  Review of Systems  Constitutional: Positive for malaise/fatigue.  Eyes: Positive for blurred vision.  Respiratory: Negative.   Cardiovascular: Positive for palpitations.  Gastrointestinal: Negative.   Genitourinary:       Urination in public places  Musculoskeletal: Positive for back pain, joint pain and neck pain.  Skin: Negative.    Neurological: Positive for weakness and headaches.  Endo/Heme/Allergies: Negative.   Psychiatric/Behavioral: Positive for depression and substance abuse. The patient is nervous/anxious and has insomnia.     Blood pressure 128/79, pulse 105, temperature 97.9 F (36.6 C), temperature source Oral, resp. rate 16, height '6\' 1"'  (1.854 m), weight 79.379 kg (175 lb).Body mass index is 23.09 kg/(m^2).  General Appearance: Fairly Groomed  Engineer, water::  Fair  Speech:  Clear and Coherent  Volume:  Decreased  Mood:  Anxious and sad, worried, in pain  Affect:  Restricted  Thought Process:  Coherent and Goal Directed  Orientation:  Full (Time, Place, and Person)  Thought Content:  symtpoms worries concerns  Suicidal Thoughts:  No  Homicidal Thoughts:  No  Memory:  Immediate;   Fair Recent;   Fair Remote;   Fair  Judgement:  Fair  Insight:  Present  Psychomotor Activity:  Restlessness  Concentration:  Fair  Recall:  [employer] of Knowledge:NA  Language: Fair  Akathisia:  No  Handed:    AIMS (if indicated):     Assets:  Desire for Improvement Housing Social Support  Sleep:  Number of Hours: 2.75    Musculoskeletal: Strength & Muscle Tone: decreased Gait & Station: uses a cane Patient leans: N/A  Past Psychiatric History: Diagnosis:  Hospitalizations: Denies  Outpatient Care: Dr. Toy Care   Substance Abuse Care: Denies  Self-Mutilation: Denies  Suicidal Attempts:Denies  Violent Behaviors: "toward inanimate objets"   Past Medical History:   Past Medical History  Diagnosis Date  . Tension headache   . Anxiety and depression   . Alcohol abuse   .  Skull fracture 2004    Trauma  . Pilonidal cyst   . Difficulty urinating     cant urinate in public places (restrooms)   Loss of Consciousness:  fell from a parking deck Seizure History:  after the fall Traumatic Brain Injury:  fall Allergies:   Allergies  Allergen Reactions  . Avelox [Moxifloxacin Hcl In Nacl] Nausea And  Vomiting  . Fluoxetine     Intolerant of all SSRI's, NE drugs  . Moxifloxacin Nausea And Vomiting   PTA Medications: Prescriptions prior to admission  Medication Sig Dispense Refill  . chlordiazePOXIDE (LIBRIUM) 25 MG capsule Take 25 mg by mouth 3 (three) times daily as needed for anxiety.      . clonazePAM (KLONOPIN) 1 MG tablet Take 1-2 mg by mouth 3 (three) times daily.      . cloNIDine (CATAPRES) 0.1 MG tablet Take 0.1 mg by mouth 4 (four) times daily.      Marland Kitchen loperamide (IMODIUM) 2 MG capsule Take 2 mg by mouth as needed for diarrhea or loose stools (diarrhea).      . methocarbamol (ROBAXIN) 500 MG tablet Take 500 mg by mouth every 8 (eight) hours as needed for muscle spasms (muscle spasm).      . promethazine (PHENERGAN) 25 MG tablet Take 25 mg by mouth every 6 (six) hours as needed for nausea or vomiting (nausea).        Previous Psychotropic Medications:  Medication/Dose    Cymbalta was given but caused increased panic attacks. He increased the Xanax to 8 mg daily. States he takes Klonopin 2 mg TID, and Librium 25 mg HS, Dr. Toy Care is planning to replaced 2 mg of Klonopin for Librium 56m with the plan of weaning off the Librium     Prozac Paxil Celexa Lexapro Cymbalta, Wellbutrin, Effexor, Pristiq, TCA, Depakote Lithium, Seroquel, Neurontin side effects to all of them increased anxiety, panic         Substance Abuse History in the last 12 months:  Yes.    Consequences of Substance Abuse: Blackouts:   Withdrawal Symptoms:   increased aches and pains  Social History:  reports that he has quit smoking. His smoking use included Cigarettes. He smoked 0.00 packs per day. He has never used smokeless tobacco. He reports that he uses illicit drugs (Marijuana). He reports that he does not drink alcohol. 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 Legal History: Denies Hobbies/Interests:  Family History:   Family History  Problem Relation Age of Onset  . Depression Mother   . Anxiety disorder Mother   . Alcohol abuse Mother   . Hypertension Father   . Hyperlipidemia Father   . Coronary  artery disease Neg Hx   . Prostate cancer Neg Hx   . Colon cancer Neg Hx   . Diabetes Neg Hx   . Cancer Paternal Aunt     breast    Results for orders placed during the hospital encounter of 06/21/14 (from the past 72 hour(s))  ACETAMINOPHEN LEVEL     Status: None   Collection Time    06/21/14 10:08 PM      Result Value Ref Range   Acetaminophen (Tylenol), Serum <15.0  10 - 30 ug/mL   Comment:            THERAPEUTIC CONCENTRATIONS VARY     SIGNIFICANTLY. A RANGE OF 10-30     ug/mL MAY BE AN EFFECTIVE     CONCENTRATION FOR MANY PATIENTS.     HOWEVER, SOME ARE BEST TREATED     AT CONCENTRATIONS OUTSIDE THIS     RANGE.     ACETAMINOPHEN CONCENTRATIONS     >150 ug/mL AT 4 HOURS AFTER     INGESTION AND >50 ug/mL AT 12     HOURS AFTER INGESTION ARE     OFTEN ASSOCIATED WITH TOXIC     REACTIONS.  CBC     Status: Abnormal   Collection Time    06/21/14 10:08 PM      Result Value Ref Range   WBC 12.4 (*) 4.0 - 10.5 K/uL   RBC 5.29  4.22 - 5.81 MIL/uL    Hemoglobin 15.2  13.0 - 17.0 g/dL   HCT 44.2  39.0 - 52.0 %   MCV 83.6  78.0 - 100.0 fL   MCH 28.7  26.0 - 34.0 pg   MCHC 34.4  30.0 - 36.0 g/dL   RDW 13.0  11.5 - 15.5 %   Platelets 359  150 - 400 K/uL  COMPREHENSIVE METABOLIC PANEL     Status: Abnormal   Collection Time    06/21/14 10:08 PM      Result Value Ref Range   Sodium 145  137 - 147 mEq/L   Potassium 4.0  3.7 - 5.3 mEq/L   Chloride 105  96 - 112 mEq/L   CO2 25  19 - 32 mEq/L   Glucose, Bld 108 (*) 70 - 99 mg/dL   BUN 11  6 - 23 mg/dL   Creatinine, Ser 0.83  0.50 - 1.35 mg/dL   Calcium 10.4  8.4 - 10.5 mg/dL   Total Protein 8.0  6.0 - 8.3 g/dL   Albumin 4.2  3.5 - 5.2 g/dL   AST 26  0 - 37 U/L   ALT 49  0 - 53 U/L   Alkaline Phosphatase 81  39 - 117 U/L   Total Bilirubin 0.3  0.3 - 1.2 mg/dL   GFR calc non Af Amer >90  >90 mL/min   GFR calc Af Amer >90  >90 mL/min   Comment: (NOTE)     The eGFR has been calculated using the CKD EPI equation.     This calculation has not been validated in all clinical situations.     eGFR's persistently <90 mL/min signify possible Chronic Kidney     Disease.   Anion gap 15  5 - 15  ETHANOL     Status: None   Collection Time    06/21/14 10:08 PM      Result Value Ref Range   Alcohol, Ethyl (B) <11  0 - 11 mg/dL   Comment:  LOWEST DETECTABLE LIMIT FOR     SERUM ALCOHOL IS 11 mg/dL     FOR MEDICAL PURPOSES ONLY  SALICYLATE LEVEL     Status: Abnormal   Collection Time    06/21/14 10:08 PM      Result Value Ref Range   Salicylate Lvl <0.0 (*) 2.8 - 20.0 mg/dL  URINE RAPID DRUG SCREEN (HOSP PERFORMED)     Status: Abnormal   Collection Time    06/22/14 12:02 AM      Result Value Ref Range   Opiates NONE DETECTED  NONE DETECTED   Cocaine NONE DETECTED  NONE DETECTED   Benzodiazepines POSITIVE (*) NONE DETECTED   Amphetamines NONE DETECTED  NONE DETECTED   Tetrahydrocannabinol POSITIVE (*) NONE DETECTED   Barbiturates NONE DETECTED  NONE DETECTED   Comment:             DRUG SCREEN FOR MEDICAL PURPOSES     ONLY.  IF CONFIRMATION IS NEEDED     FOR ANY PURPOSE, NOTIFY LAB     WITHIN 5 DAYS.                LOWEST DETECTABLE LIMITS     FOR URINE DRUG SCREEN     Drug Class       Cutoff (ng/mL)     Amphetamine      1000     Barbiturate      200     Benzodiazepine   923     Tricyclics       300     Opiates          300     Cocaine          300     THC              50   Psychological Evaluations:  Assessment:   DSM5:  Schizophrenia Disorders:  none Obsessive-Compulsive Disorders:  none Trauma-Stressor Disorders:  none Substance/Addictive Disorders:  Opioid Disorder - Severe (304.00), Benzodiazepine Dependence Depressive Disorders:  Major Depressive Disorder - Moderate (296.22)  AXIS I:  Panic Disorder and Substance Induced Mood Disorder AXIS II:  Deferred AXIS III:   Past Medical History  Diagnosis Date  . Tension headache   . Anxiety and depression   . Alcohol abuse   . Skull fracture 2004    Trauma  . Pilonidal cyst   . Difficulty urinating     cant urinate in public places (restrooms)   AXIS IV:  other psychosocial or environmental problems AXIS V:  41-50 serious symptoms  Treatment Plan/Recommendations:  Supportive approach/coping skills/relapse prevention                                                                 Pursue Benzodiazepine Detox as per Dr. Starleen Arms plan                                                                 Clonidine detox protocol  Treatment Plan Summary: Daily contact with patient to assess and evaluate symptoms  and progress in treatment Medication management Current Medications:  Current Facility-Administered Medications  Medication Dose Route Frequency Provider Last Rate Last Dose  . acetaminophen (TYLENOL) tablet 650 mg  650 mg Oral Q6H PRN Laverle Hobby, PA-C      . alum & mag hydroxide-simeth (MAALOX/MYLANTA) 200-200-20 MG/5ML suspension 30 mL  30 mL Oral Q4H PRN Laverle Hobby, PA-C      .  chlordiazePOXIDE (LIBRIUM) capsule 25 mg  25 mg Oral TID PRN Laverle Hobby, PA-C      . clonazePAM Bobbye [place]) tablet 1-2 mg  1-2 mg Oral TID Laverle Hobby, PA-C   2 mg at 06/23/14 7062  . cloNIDine (CATAPRES) tablet 0.1 mg  0.1 mg Oral QID Laverle Hobby, PA-C   0.1 mg at 06/23/14 0813   Followed by  . [START ON 06/25/2014] cloNIDine (CATAPRES) tablet 0.1 mg  0.1 mg Oral BH-qamhs Spencer E Simon, PA-C       Followed by  . [START ON 06/27/2014] cloNIDine (CATAPRES) tablet 0.1 mg  0.1 mg Oral QAC breakfast Laverle Hobby, PA-C      . dicyclomine (BENTYL) tablet 20 mg  20 mg Oral Q6H PRN Laverle Hobby, PA-C      . hydrOXYzine (ATARAX/VISTARIL) tablet 25 mg  25 mg Oral Q6H PRN Laverle Hobby, PA-C      . loperamide (IMODIUM) capsule 2-4 mg  2-4 mg Oral PRN Laverle Hobby, PA-C      . magnesium hydroxide (MILK OF MAGNESIA) suspension 30 mL  30 mL Oral Daily PRN Laverle Hobby, PA-C      . methocarbamol (ROBAXIN) tablet 500 mg  500 mg Oral Q8H PRN Laverle Hobby, PA-C      . naproxen (NAPROSYN) tablet 500 mg  500 mg Oral BID PRN Laverle Hobby, PA-C      . ondansetron (ZOFRAN-ODT) disintegrating tablet 4 mg  4 mg Oral Q6H PRN Laverle Hobby, PA-C      . traZODone (DESYREL) tablet 50 mg  50 mg Oral QHS,MR X 1 Laverle Hobby, PA-C        Observation Level/Precautions:  15 minute checks  Laboratory:  As per the ED  Psychotherapy:  Individual/group  Medications:  Clonidine Detox/Klonopin/Librium  Consultations:    Discharge Concerns: Need for rehab  Estimated LOS: 3-5 days  Other:     I certify that inpatient services furnished can reasonably be expected to improve the patient's condition.   Konstance Happel A 7/21/201510:14 AM

## 2014-06-23 NOTE — Progress Notes (Signed)
Recreation Therapy Notes  Animal-Assisted Activity/Therapy (AAA/T) Program Checklist/Progress Notes Patient Eligibility Criteria Checklist & Daily Group note for Rec Tx Intervention  Date: 07.21.2015 Time: 2:45pm Location: [facility] [address]    AAA/T Program Assumption of Risk Form signed by Patient/ or Parent Legal Guardian yes  Patient is free of allergies or sever asthma yes  Patient reports no fear of animals yes  Patient reports no history of cruelty to animals yes   Patient understands his/her participation is voluntary yes  Patient washes hands before animal contact yes  Patient washes hands after animal contact yes  Behavioral Response: Engaged, Appropriate   Education: Hand Washing, Appropriate Animal Interaction   Education Outcome: Acknowledges understanding  Clinical Observations/Feedback: Patient interacted appropriately with therapy dog.   Laureen Ochs Chrishawna Farina, LRT/CTRS  Lane Hacker 06/23/2014 4:35 PM

## 2014-06-23 NOTE — Tx Team (Signed)
Interdisciplinary Treatment Plan Update (Adult)  Date: 06/23/2014   Time Reviewed: 11:22 AM  Progress in Treatment:  Attending groups: No.  Participating in groups:  No.  Taking medication as prescribed: Yes  Tolerating medication: Yes  Family/Significant othe contact made: Not required as pt did not endorse SI during admission.   Patient understands diagnosis: Yes, AEB seeking treatment for  Opioid detox/phentanyol abuse, mood stabilization, med management.  Discussing patient identified problems/goals with staff: Yes  Medical problems stabilized or resolved: Yes  Denies suicidal/homicidal ideation: Yes during admission/self report.  Patient has not harmed self or Others: Yes  New problem(s) identified:  Discharge Plan or Barriers: Pt currently sees Dr. Toy Care for mental health med management. CSW assessing for appropriate referrals.  Additional comments:  Pt was pleasant and cooperative during the assessment. Informed the writer that he's here to get help thru withdrawals from his fentanyl patches. Stated his md gave him a Rx for 3 month supply of patches. "Enough to last until he gets another doctor". Pt stated that towards the end of his supply he began cutting the patches in half. Stated that Saturday was his last patch, which had been cut down to quarters. Pt stated his hard to find another doctor because they won't accept a blood sample. Stated suffers from paruresis and cannot urinate while at the doctors office. Pt's left leg is also 1/4" shorter that his right. Pt wears a lift in his shoe for that purpose. Pt denies SI, HI, And A/V.  Reason for Continuation of Hospitalization: Clonidine taper-withdrawals Mood stabilization Med management  Estimated length of stay: 3-5 days  For review of initial/current patient goals, please see plan of care.  Attendees:  Patient:    Family:    Physician: Carlton Adam MD 06/23/2014 11:22 AM   Nursing: Butch Penny RN 06/23/2014 11:22 AM   Clinical Social  Worker [place], [place]  06/23/2014 11:22 AM   Other: Grayland Ormond, RN  06/23/2014 11:22 AM   Other: Hardie Pulley. PA 06/23/2014 11:22 AM   Other: Gerline Legacy Nurse CM  06/23/2014 11:22 AM   Other:    Scribe for Treatment Team:  Nira Conn Smart LCSWA 06/23/2014 11:22 AM

## 2014-06-23 NOTE — Progress Notes (Signed)
D: Pt denies SI/HI/AVH. Pt is pleasant and cooperative. Pt complains of feeling depressed and hopeless due to the back pain.   A: Pt was offered support and encouragement. Pt was given scheduled medications. Pt was encourage to attend groups. Q 15 minute checks were done for safety. Pt encouraged to help find CAM methods to help with pain relief.   R:Pt attends groups and interacts well with peers and staff. Pt is taking medication. Pt receptive to treatment and safety maintained on unit. Pt stated he will get back into walking and swimming when he gets out of here.

## 2014-06-23 NOTE — Progress Notes (Signed)
D:  Patient's self inventory sheet, patient has poor sleep, no sleep medication.  Poor appetite, low energy level, poor concentration.  Rated depression 9, hopeless 7, anxiety 10.  Withdrawals include tremors, chilling, cramping, agitation, nausea, runny nose which stopped, irritability.  Denied SI.  Physical problems in past 24 hours are pain, dizzy, can't breathe, headache, blurred vision.  Does have physical pain, worst pain #8, back.  Will be establishing his patient's rights.  Plans to argue rules/discharge.  Too early for discharge plans.  Not sure of discharge plan and how to care for himself. A:  Medications administered per MD orders.  Emotional support and encouragement given patient. R:  Denied SI and HI, contracts for safety.  Denied A/V hallucinations.  Safety maintained with 15 minute checks.

## 2014-06-23 NOTE — [facility] Group Notes (Signed)
[place] LCSW Group Therapy  06/23/2014 1:55 PM  Type of Therapy:  Group Therapy  Participation Level:  Did Not Attend-pt in room resting  Smart, Dahna Hattabaugh LCSWA 06/23/2014, 1:55 PM

## 2014-06-23 NOTE — Tx Team (Signed)
Initial Interdisciplinary Treatment Plan  PATIENT STRENGTHS: (choose at least two) Active sense of humor Average or above average intelligence Capable of independent living General fund of knowledge Motivation for treatment/growth Physical Health Special hobby/interest Supportive family/friends Work skills  PATIENT STRESSORS: Medication change or noncompliance Substance abuse   PROBLEM LIST: Problem List/Patient Goals Date to be addressed Date deferred Reason deferred Estimated date of resolution  To help with withdrawal from fentanyl patchesf 06/23/14                 Depression 06/23/14     Increased risk for suicide 06/23/14     Substance abuse 06/23/14                        DISCHARGE CRITERIA:  Ability to meet basic life and health needs Adequate post-discharge living arrangements Improved stabilization in mood, thinking, and/or behavior Medical problems require only outpatient monitoring Motivation to continue treatment in a less acute level of care Need for constant or close observation no longer present Safe-care adequate arrangements made Verbal commitment to aftercare and medication compliance Withdrawal symptoms are absent or subacute and managed without 24-hour nursing intervention  PRELIMINARY DISCHARGE PLAN: Attend 12-step recovery group Outpatient therapy Participate in family therapy Return to previous living arrangement  PATIENT/FAMIILY INVOLVEMENT: This treatment plan has been presented to and reviewed with the patient, [name], and/or family member.  The patient and family have been given the opportunity to ask questions and make suggestions.  Wynonia Hazard [facility] 06/23/2014, 12:38 AM

## 2014-06-23 NOTE — Progress Notes (Signed)
The focus of this group is to educate the patient on the purpose and policies of crisis stabilization and provide a format to answer questions about their admission.  The group details unit policies and expectations of patients while admitted.  Patient attended 0900 nurse education orientation group this morning.  Patient actively participated, appropriate affect, alert, appropriate insight and engagement.  Today patient will work on 3 goals for discharge.  

## 2014-06-23 NOTE — [facility] Suicide Risk Assessment (Signed)
Suicide Risk Assessment  Admission Assessment     Nursing information obtained from:  Patient Demographic factors:  Male;Caucasian;Unemployed Current Mental Status:  NA Loss Factors:  Decline in physical health Historical Factors:  Family history of mental illness or substance abuse Risk Reduction Factors:  Sense of responsibility to family;Living with another person, especially a relative;Positive coping skills or problem solving skills Total Time spent with patient: 45 minutes  CLINICAL FACTORS:   Severe Anxiety and/or Agitation Depression:   Comorbid alcohol abuse/dependence Alcohol/Substance Abuse/Dependencies  COGNITIVE FEATURES THAT CONTRIBUTE TO RISK:  Polarized thinking Thought constriction (tunnel vision)    SUICIDE RISK:   Mild:   PLAN OF CARE: Supportive approach/coping skills/relapse prevention                               Will detox with clonidine                               Will continue Klonopin/Librium as per Dr. Starleen Arms benzodiazepine detox plan                               Reassess and optimize response to psychotropics  I certify that inpatient services furnished can reasonably be expected to improve the patient's condition.  [name] A 06/23/2014, 12:59 PM

## 2014-06-23 NOTE — Progress Notes (Signed)
Adult Psychoeducational Group Note  Date:  06/23/2014 Time:  10:54 PM  Group Topic/Focus:  Wrap-Up Group:   The focus of this group is to help patients review their daily goal of treatment and discuss progress on daily workbooks.  Participation Level:  Did Not Attend   Edsel Petrin 06/23/2014, 10:54 PM

## 2014-06-23 NOTE — Progress Notes (Signed)
Patient ID: [name], male   DOB: 03/08/1981, 33 y.o.   MRN: [id number]   Pt was pleasant and cooperative during the assessment. Informed the writer that he's here to get help thru withdrawals from his fentanyl patches. Stated his md gave him a Rx for 3 month supply of patches. "Enough to last until he gets another doctor".  Pt stated that towards the end of his supply he began cutting the patches in half. Stated that Saturday was his last patch, which had been cut down to quarters. Pt stated his hard to find another doctor because they won't accept a blood sample. Stated suffers from paruresis and cannot urinate while at the doctors office. Pt's left leg is also 1/4" shorter that his right. Pt wears a lift in his shoe for that purpose. Pt denies SI, HI, And A/V.

## 2014-06-24 DIAGNOSIS — F1994 Other psychoactive substance use, unspecified with psychoactive substance-induced mood disorder: Secondary | ICD-10-CM

## 2014-06-24 DIAGNOSIS — F332 Major depressive disorder, recurrent severe without psychotic features: Secondary | ICD-10-CM

## 2014-06-24 DIAGNOSIS — F1320 Sedative, hypnotic or anxiolytic dependence, uncomplicated: Secondary | ICD-10-CM

## 2014-06-24 DIAGNOSIS — F1110 Opioid abuse, uncomplicated: Secondary | ICD-10-CM

## 2014-06-24 MED ORDER — TRAZODONE HCL 100 MG PO TABS
100.0000 mg | ORAL_TABLET | Freq: Every evening | ORAL | Status: DC | PRN
  Filled 2014-06-24: qty 1

## 2014-06-24 MED ORDER — HYDROXYZINE HCL 50 MG/ML IM SOLN
50.0000 mg | Freq: Every day | INTRAMUSCULAR | Status: DC
  Filled 2014-06-24 (×2): qty 1

## 2014-06-24 MED ORDER — TRAZODONE HCL 100 MG PO TABS
100.0000 mg | ORAL_TABLET | Freq: Every evening | ORAL | Status: DC | PRN
  Filled 2014-06-24 (×3): qty 1

## 2014-06-24 MED ORDER — LAMOTRIGINE 25 MG PO TABS
25.0000 mg | ORAL_TABLET | Freq: Every day | ORAL | Status: DC
  Administered 2014-06-24 – 2014-06-25 (×2): 25 mg via ORAL
  Filled 2014-06-24: qty 1
  Filled 2014-06-24: qty 4
  Filled 2014-06-24: qty 1
  Filled 2014-06-24: qty 4
  Filled 2014-06-24 (×2): qty 1

## 2014-06-24 MED ORDER — ENSURE COMPLETE PO LIQD
237.0000 mL | Freq: Three times a day (TID) | ORAL | Status: DC
  Administered 2014-06-25: 237 mL via ORAL

## 2014-06-24 MED ORDER — HYDROXYZINE HCL 50 MG PO TABS
50.0000 mg | ORAL_TABLET | Freq: Every evening | ORAL | Status: DC | PRN
  Administered 2014-06-25: 50 mg via ORAL
  Filled 2014-06-24: qty 1

## 2014-06-24 NOTE — Progress Notes (Addendum)
[facility] MD Progress Note  06/24/2014 10:58 AM [name]  MRN:  [id number]  Subjective:  I am tired.  I does not feel good.  Nobody is helping me.  I do not like trazodone.  Objective Patient seen chart reviewed.  Patient appears very anxious and somewhat irritable.  He was given trazodone last night and he is not happy about it.  He feels very groggy and foggy.  He mentioned most of antidepressant does not help him.  He had tried numerous antidepressants with limited response.  He is complaining of nausea .  Patient appears very anxious nervous .  He endorse depression.  He requested to be discharged however he endorse because nobody is helping him.  Despite taking trazodone he did not have a good night sleep.  He feels discouraged .  We talked about the circumstances that led to the hospitalization.  The patient still has a lot of withdrawal symptoms.  He is taking clonidine .  He denies any suicidal thoughts or any hallucination however he has a lot of restlessness and rumination .    Diagnosis:   DSM5: Schizophrenia Disorders:  none  Obsessive-Compulsive Disorders:  None Trauma-Stressor Disorders:  none  Substance/Addictive Disorders:  Opioid Disorder - Severe (304.00) Depressive Disorders:  Major Depressive Disorder - Moderate (296.22) Total Time spent with patient: 20 minutes  Axis I: Major Depression, Recurrent severe, Substance Induced Mood Disorder and Benzodiazepine dependence, opiate disorder Axis II: Deferred Axis III:  Past Medical History  Diagnosis Date  . Tension headache   . Anxiety and depression   . Alcohol abuse   . Skull fracture 2004    Trauma  . Pilonidal cyst   . Difficulty urinating     cant urinate in public places (restrooms)   Axis IV: other psychosocial or environmental problems, problems related to social environment and problems with primary support group  ADL's:  Impaired  Sleep: Poor  Appetite:  Fair  Suicidal Ideation:  Plan:  Denies Intent:   Denies Means:  Denies Homicidal Ideation:  Plan:  Denies Intent:  Denies Means:  Denies AEB (as evidenced by):  Psychiatric Specialty Exam: Physical Exam  Review of Systems  Constitutional: Positive for malaise/fatigue.  Gastrointestinal: Positive for nausea.  Skin: Negative for itching and rash.    Blood pressure 112/63, pulse 58, temperature 98.6 F (37 C), temperature source Oral, resp. rate 16, height 5\' 5"  (1.651 m), weight 205 lb (92.987 kg).Body mass index is 34.11 kg/(m^2).  General Appearance: Disheveled and Guarded  Eye Contact::  Poor  Speech:  Slow  Volume:  Decreased  Mood:  Depressed, Hopeless and Irritable  Affect:  Constricted and Depressed  Thought Process:  Loose  Orientation:  Full (Time, Place, and Person)  Thought Content:  Rumination  Suicidal Thoughts:  No  Homicidal Thoughts:  No  Memory:  Immediate;   Fair Recent;   Fair Remote;   Fair  Judgement:  Impaired  Insight:  Lacking  Psychomotor Activity:  Decreased and Restlessness  Concentration:  Fair  Recall:  [place]: Fair  Akathisia:  No  Handed:  Right  AIMS (if indicated):     Assets:  Desire for Improvement  Sleep:  Number of Hours: 5   Musculoskeletal: Strength & Muscle Tone: decreased Gait & Station: unsteady, He uses cain Patient leans: Front  Current Medications: Current Facility-Administered Medications  Medication Dose Route Frequency Provider Last Rate Last Dose  . acetaminophen (TYLENOL) tablet 650 mg  650 mg Oral Q6H PRN Laverle Hobby, PA-C      . alum & mag hydroxide-simeth (MAALOX/MYLANTA) 200-200-20 MG/5ML suspension 30 mL  30 mL Oral Q4H PRN Laverle Hobby, PA-C      . chlordiazePOXIDE (LIBRIUM) capsule 25 mg  25 mg Oral QHS Nicholaus Bloom, MD   25 mg at 06/23/14 2144  . clonazePAM (KLONOPIN) tablet 2 mg  2 mg Oral TID AC Nicholaus Bloom, MD   2 mg at 06/24/14 0544  . cloNIDine (CATAPRES) tablet 0.1 mg  0.1 mg Oral QID Laverle Hobby, PA-C    0.1 mg at 06/24/14 0350   Followed by  . [START ON 06/25/2014] cloNIDine (CATAPRES) tablet 0.1 mg  0.1 mg Oral BH-qamhs Spencer E Simon, PA-C       Followed by  . [START ON 06/27/2014] cloNIDine (CATAPRES) tablet 0.1 mg  0.1 mg Oral QAC breakfast Laverle Hobby, PA-C      . dicyclomine (BENTYL) tablet 20 mg  20 mg Oral Q6H PRN Laverle Hobby, PA-C   20 mg at 06/24/14 0843  . hydrOXYzine (ATARAX/VISTARIL) tablet 25 mg  25 mg Oral Q6H PRN Laverle Hobby, PA-C   25 mg at 06/24/14 0843  . loperamide (IMODIUM) capsule 2-4 mg  2-4 mg Oral PRN Laverle Hobby, PA-C      . magnesium hydroxide (MILK OF MAGNESIA) suspension 30 mL  30 mL Oral Daily PRN Laverle Hobby, PA-C      . methocarbamol (ROBAXIN) tablet 500 mg  500 mg Oral Q8H PRN Laverle Hobby, PA-C   500 mg at 06/24/14 0938  . naproxen (NAPROSYN) tablet 500 mg  500 mg Oral BID PRN Laverle Hobby, PA-C   500 mg at 06/23/14 1540  . ondansetron (ZOFRAN-ODT) disintegrating tablet 4 mg  4 mg Oral Q6H PRN Laverle Hobby, PA-C   4 mg at 06/24/14 0843  . traZODone (DESYREL) tablet 100 mg  100 mg Oral QHS,MR X 1 Laverle Hobby, PA-C        Lab Results: No results found for this or any previous visit (from the past 48 hour(s)).  Physical Findings: AIMS: Facial and Oral Movements Muscles of Facial Expression: None, normal Lips and Perioral Area: None, normal Jaw: None, normal Tongue: None, normal,Extremity Movements Upper (arms, wrists, hands, fingers): None, normal Lower (legs, knees, ankles, toes): None, normal, Trunk Movements Neck, shoulders, hips: None, normal, Overall Severity Severity of abnormal movements (highest score from questions above): None, normal Incapacitation due to abnormal movements: None, normal Patient's awareness of abnormal movements (rate only patient's report): No Awareness, Dental Status Current problems with teeth and/or dentures?: No Does patient usually wear dentures?: No  CIWA:  CIWA-Ar Total: 1 COWS:  COWS  Total Score: 7  Treatment Plan Summary: Daily contact with patient to assess and evaluate symptoms and progress in treatment Medication management discontinue trazodone.  Will start Vistaril 50 mg at bedtime and Lamictal 25 mg daily.  He has tried numerous antidepressant but limited response.  Discussed risks and benefits of medication.  Discussed rash with Lamictal and in that case he needed to stop the medication immediately.  Encouraged to participate in group milieu therapy.   Plan:  Medical Decision Making Problem Points:  Established problem, worsening (2), New problem, with additional work-up planned (4), Review of last therapy session (1) and Review of psycho-social stressors (1) Data Points:  Review or order medicine tests (1) Review and summation of old records (  2) Review of medication regiment & side effects (2) Review of new medications or change in dosage (2)  I certify that inpatient services furnished can reasonably be expected to improve the patient's condition.   Rumaysa Sabatino T. 06/24/2014, 10:58 AM

## 2014-06-24 NOTE — Progress Notes (Signed)
Pt has been in bed all day today.  He did go into one group this morning but got up and walked out. He went to cafeteria for lunch and his wife visited. He took all medications as ordered and requested bentyl for stomach cramps, zofran for nausea and vistaril for anxiety around 0843 this morning.  He filled out his self-inventory and under the section related to sleep he wrote,"you poisoned me" and under physical symptoms once again he wrote,"you poisoned me". His goal today was, "not be poisoned, caloric intake, and possible discharge home due to dangerous negligence" the question what will you do today to help meet your goal,"shout at the staff"He does not want any antidepressants and Dr Adele Schilder spoke with patient today.
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
## 2014-06-24 NOTE — Progress Notes (Signed)
NUTRITION ASSESSMENT  Consult per RN secondary to poor po and wife's request for supplement.  INTERVENTION: 1. Educated patient on the importance of nutrition and encouraged intake of food and beverages. 2. Discussed weight goals. 3. Supplements: Ensure Complete po TID, each supplement provides 350 kcal and 13 grams of protein   NUTRITION DIAGNOSIS: Inadequate oral intake related to poor appetite secondary to detox AEB patient report.  Goal: Pt to meet >/= 90% of their estimated nutrition needs.  Monitor:  PO intake  Assessment:  Patient admitted with Major depression, substance induced mood disorder and benzodiazepine dependence, opiate disorder.    Patient reports poor appetite and intake secondary to withdrawal symptoms for the past 5 days.  Anger about back problem and pain as well as lack of help here.  UBW 175 lbs and patient reports no change.  Expect 7/21 wt of 205 lbs is inaccurate as 175 lbs 7/19.  Was not eating prior to admit except very small amounts and Ensure.  32 y.o. male  Height: Ht Readings from Last 1 Encounters:  06/23/14 5\' 5"  (1.651 m)    Weight: Wt Readings from Last 1 Encounters:  06/23/14 205 lb (92.987 kg)    Weight Hx: Wt Readings from Last 10 Encounters:  06/23/14 205 lb (92.987 kg)  06/21/14 175 lb (79.379 kg)  02/09/14 180 lb (81.647 kg)  10/15/13 174 lb 6.4 oz (79.107 kg)  08/07/13 176 lb (79.833 kg)  05/13/13 180 lb (81.647 kg)  03/05/13 190 lb 12.8 oz (86.546 kg)  12/17/12 190 lb 0.6 oz (86.202 kg)  10/07/12 186 lb (84.369 kg)  07/18/12 184 lb (83.462 kg)    BMI:  29 Pt meets criteria for overweight based on current BMI.  Estimated Nutritional Needs: Kcal: 25-30 kcal/kg Protein: > 1 gram protein/kg Fluid: 1 ml/kcal  Diet Order: General Pt is also offered choice of unit snacks mid-morning and mid-afternoon.  Pt is eating as desired.   Lab results and medications reviewed.   Antonieta Iba, RD, LDN Clinical Inpatient  Dietitian Pager:  [phone] Weekend and after hours pager:  [phone]

## 2014-06-24 NOTE — Progress Notes (Signed)
Pt had a little issue with the Trazodone last night and woke up with a H/A, stated he was seeing things that were not there for a little while. Pt was given his 7 am Klonopin early  per PA on duty. Pt stated he was having a hard time eating food and wondered if he could have Ensures.

## 2014-06-24 NOTE — BHH Group Notes (Signed)
[facility] LCSW Aftercare Discharge Planning Group Note   06/24/2014 10:19 AM  Participation Quality:  Appropriate   Mood/Affect:  Anxious  Depression Rating:  0  Anxiety Rating:  9 1/2   Thoughts of Suicide:  No Will you contract for safety?   NA  Current AVH:  No  Plan for Discharge/Comments:  Pt reports that he was given "the wrong meds last night" and has been experiencing anxiety all night into today. Pt demanding to be seen by MD so that he can d/c. CSW assured pt that he would see MD who would then make that call. Pt plans to see to Dr Toy Care for med management and has appt scheduled. PT not interested in therapy or any other services. He plans to return home to his wife at d/c.   Transportation Means: wife   Supports: wife and family   Smart, Alicia Amel

## 2014-06-24 NOTE — Progress Notes (Signed)
Did nt attend

## 2014-06-24 NOTE — Progress Notes (Signed)
Attended Group

## 2014-06-24 NOTE — ED Provider Notes (Signed)
Medical screening examination/treatment/procedure(s) were performed by non-physician practitioner and as supervising physician I was immediately available for consultation/collaboration.   EKG Interpretation None        Orpah Greek, MD 06/24/14 1104

## 2014-06-25 DIAGNOSIS — F1120 Opioid dependence, uncomplicated: Secondary | ICD-10-CM

## 2014-06-25 MED ORDER — LOPERAMIDE HCL 2 MG PO CAPS: 2.0000 mg | ORAL_CAPSULE | ORAL | Status: DC | PRN

## 2014-06-25 MED ORDER — HYDROXYZINE HCL 25 MG PO TABS: ORAL_TABLET | ORAL | Status: DC

## 2014-06-25 MED ORDER — HYDROXYZINE HCL 25 MG PO TABS
25.0000 mg | ORAL_TABLET | Freq: Three times a day (TID) | ORAL | Status: DC | PRN
  Filled 2014-06-25 (×2): qty 20

## 2014-06-25 MED ORDER — METHOCARBAMOL 500 MG PO TABS: 500.0000 mg | ORAL_TABLET | Freq: Three times a day (TID) | ORAL | Status: DC | PRN

## 2014-06-25 MED ORDER — CLONIDINE HCL 0.1 MG PO TABS: 0.1000 mg | ORAL_TABLET | Freq: Four times a day (QID) | ORAL | Status: DC

## 2014-06-25 MED ORDER — LAMOTRIGINE 25 MG PO TABS: 25.0000 mg | ORAL_TABLET | Freq: Every day | ORAL | Status: DC

## 2014-06-25 MED ORDER — CLONAZEPAM 1 MG PO TABS: 1.0000 mg | ORAL_TABLET | Freq: Three times a day (TID) | ORAL | Status: DC

## 2014-06-25 NOTE — ED Provider Notes (Signed)
Medical screening examination/treatment/procedure(s) were conducted as a shared visit with non-physician practitioner(s) and myself.  I personally evaluated the patient during the encounter.   EKG Interpretation None       Orlie Dakin, MD 06/25/14 0002

## 2014-06-25 NOTE — BHH Group Notes (Signed)
0900 nursing orientation group   The focus of this group is to educate the patient on the purpose and policies of crisis stabilization and provide a format to answer questions about their admission.  The group details unit policies and expectations of patients while admitted.   Pt was an active participant in group and was appropriate in sharing with his peers and staff.

## 2014-06-25 NOTE — Progress Notes (Signed)
[facility] Adult Case Management Discharge Plan :  Will you be returning to the same living situation after discharge: Yes,  returning home At discharge, do you have transportation home?:Yes,  wife Do you have the ability to pay for your medications:Yes,  BCBS private insurance  Release of information consent forms completed and submitted to Medical Records by CSW.  Patient to Follow up at: Follow-up Information   Follow up with [facility]  On 07/27/2014. (Appt with Dr. Toy Care on this date at 2:00PM for medication management. )    Contact information:   Attn: Dr. Toy Care [address] [place], [place] [address] Phone: [phone] Fax: [phone]      Follow up with Patient refused referral for therapy services. .      Patient denies SI/HI:   Yes,  during group/admission/self report.     Safety Planning and Suicide Prevention discussed:  Yes,  SPE not required for this pt as he did not endorse SI during admission or stay at [facility]. SPI pamphlet provided to pt and he was encouraged to share information with support network, ask questions, and talk about any concerns relating to SPE  Smart, [name] LCSWA 06/25/2014, 10:52 AM

## 2014-06-25 NOTE — Progress Notes (Signed)
Pt was discharged home today.  He denied any S/I H/I or A/V hallucinations.    He was given f/u appointment, rx, sample medications, hotline info booklet and info for CD-IOP provided by the case manager.  He voiced understanding to all instructions provided.

## 2014-06-25 NOTE — BHH Suicide Risk Assessment (Signed)
   Demographic Factors:  Male  Total Time spent with patient: 30 minutes  Psychiatric Specialty Exam: Physical Exam  ROS  Blood pressure 126/61, pulse 98, temperature 98.6 F (37 C), temperature source Oral, resp. rate 16, height 5\' 5"  (1.651 m), weight 205 lb (92.987 kg).Body mass index is 34.11 kg/(m^2).  General Appearance: Casual  Eye Contact::  Good  Speech:  Slow  Volume:  Normal  Mood:  Anxious  Affect:  Appropriate  Thought Process:  Coherent, Goal Directed and Logical  Orientation:  Full (Time, Place, and Person)  Thought Content:  WDL  Suicidal Thoughts:  No  Homicidal Thoughts:  No  Memory:  Immediate;   Good Recent;   Good Remote;   Good  Judgement:  Intact  Insight:  Good  Psychomotor Activity:  Tremor  Concentration:  Fair  Recall:  Good  Fund of Knowledge:Good  Language: Good  Akathisia:  No  Handed:  Right  AIMS (if indicated):     Assets:  Communication Skills Desire for Improvement Housing Resilience Social Support Talents/Skills  Sleep:  Number of Hours: 3.5    Musculoskeletal: Strength & Muscle Tone: decreased Gait & Station: uses cain to help walking Patient leans: Front   Mental Status Per Nursing Assessment::   On Admission:  NA  Current Mental Status by Physician: NA  Loss Factors: NA  Historical Factors: NA  Risk Reduction Factors:   Sense of responsibility to family, Living with another person, especially a relative, Positive social support, Positive therapeutic relationship and Positive coping skills or problem solving skills  Continued Clinical Symptoms:  Chronic Pain  Cognitive Features That Contribute To Risk:  Polarized thinking    Suicide Risk:  Minimal: No identifiable suicidal ideation.  Patients presenting with no risk factors but with morbid ruminations; may be classified as minimal risk based on the severity of the depressive symptoms  Discharge Diagnoses:   AXIS I:  Depressive Disorder NOS and Substance  Induced Mood Disorder AXIS II:  Deferred AXIS III:   Past Medical History  Diagnosis Date  . Tension headache   . Anxiety and depression   . Alcohol abuse   . Skull fracture 2004    Trauma  . Pilonidal cyst   . Difficulty urinating     cant urinate in public places (restrooms)   AXIS IV:  other psychosocial or environmental problems and problems related to social environment AXIS V:  61-70 mild symptoms  Plan Of Care/Follow-up recommendations:  Activity:  As tolerated Diet:  Unchanged from the past Other:  Patient will consider CDI OP program.  He will see Dr. Toy Care for medication management.  Is patient on multiple antipsychotic therapies at discharge:  No   Has Patient had three or more failed trials of antipsychotic monotherapy by history:  No  Recommended Plan for Multiple Antipsychotic Therapies: NA    Bryon Parker T. 06/25/2014, 10:49 AM

## 2014-06-25 NOTE — Discharge Summary (Signed)
Physician Discharge Summary Note  Patient:  [name] is an 33 y.o., male MRN:  [id number] DOB:  12/25/1980 Patient phone:  [phone] (home)  Patient address:   [place] [address],  Total Time spent with patient: Greater than 30 minutes  Date of Admission:  06/22/2014  Date of Discharge: 06/25/14  Reason for Admission:  Opioid detox  Discharge Diagnoses: Active Problems:   Benzodiazepine dependence   Substance induced mood disorder   Opioid dependence   Panic disorder   Psychiatric Specialty Exam: Physical Exam  Psychiatric: His speech is normal and behavior is normal. Judgment and thought content normal. His mood appears not anxious. His affect is not angry, not blunt, not labile and not inappropriate. Cognition and memory are normal. He does not exhibit a depressed mood.    Review of Systems  Constitutional: Negative.   HENT: Negative.   Eyes: Negative.   Respiratory: Negative.   Cardiovascular: Negative.   Gastrointestinal: Negative.   Genitourinary: Negative.   Musculoskeletal: Negative.   Skin: Negative.   Neurological: Negative.   Endo/Heme/Allergies: Negative.   Psychiatric/Behavioral: Positive for depression (Stable) and substance abuse (Opioid dependence). Negative for suicidal ideas, hallucinations and memory loss. The patient is not nervous/anxious and does not have insomnia.     Blood pressure 126/61, pulse 98, temperature 98.6 F (37 C), temperature source Oral, resp. rate 16, height 5\' 5"  (1.651 m), weight 92.987 kg (205 lb).Body mass index is 34.11 kg/(m^2).   Past Psychiatric History: Diagnosis:  Opioid dependence, Benzodiazepine dependence, Substance induced mood disorder  Hospitalizations: [facility] adult unit  Outpatient Care: [facility]  Substance Abuse Care: [facility]  Self-Mutilation: NA  Suicidal Attempts: NA  Violent Behaviors: NA   Musculoskeletal: Strength & Muscle Tone: within normal  limits Gait & Station: normal Patient leans: N/A  DSM5: Schizophrenia Disorders:  NA Obsessive-Compulsive Disorders:  NA Trauma-Stressor Disorders:  NA Substance/Addictive Disorders:  Opioid Disorder - Severe (304.00), Benzodiazepine dependence Depressive Disorders:  Substance induced mood disorder  Axis Diagnosis:  AXIS I:  Opioid dependence, Benzodiazepine dependence, Substance induced mood disorder AXIS II:  Deferred AXIS III:   Past Medical History  Diagnosis Date  . Tension headache   . Anxiety and depression   . Alcohol abuse   . Skull fracture 2004    Trauma  . Pilonidal cyst   . Difficulty urinating     cant urinate in public places (restrooms)   AXIS IV:  other psychosocial or environmental problems and Polysubstance dependence AXIS V:  62  Level of Care:  OP  Hospital Course:  33 Y/O male who states he was prescribed opioids for his back for a year straight. He took them every day. His doctor retired and new doctor asked him to stay off the Opioids. He was given 3 months prescription with instructions to wean off. Started dropping the Duragesic to 25 mg. Has been off the Duragesic for five days. Started withdrawing. States he was not able to do it at home. He "was an alcoholic" quit drinking October 12, 2008.   [name] was admitted to the hospital for opioid detoxification treatment. He was instructed by his outpatient provider to wean himself off of opioid, however, [name] stated that he had difficult time doing just that because of the hash withdrawal symptoms. He received clonidine detox protocols. He also was enrolled in the group counseling sessions and AA/NA meetings being offered and held on this unit. He learned coping skills.  Besides the  detoxification treatment, [name] was also medicated and discharged on Hydroxyzine 25 mg three times daily & 50 mg Q bedtime as needed for anxiety, Lamictal 25 mg daily for mood stabilization. He was resumed on his Clonazepam for his  pre-existing panic disorder and Clonidine 0.1 mg for high blood pressure. He tolerated his treatment regimen without any adverse effects and or reactions.  [name] has completed detox treatment and his mood is stable. He is currently being discharged to continue  Substance abuse treatment at the [facility]. He is provided with all the pertinent information required to make this appointment without problems. Upon discharge, he denies any SIHI, AVH, delusional thoughts, paranoia and or withdrawal symptoms. He received from the [place], a 4 days worth, supply samples of his [facility] discharge medications. He left [facility] with all personal belongings in no distress. Transportation per family.  Consults:  psychiatry  Significant Diagnostic Studies:  labs:  with diff, CMP, UDS, toxicology tests, U/A  Discharge Vitals:   Blood pressure 126/61, pulse 98, temperature 98.6 F (37 C), temperature source Oral, resp. rate 16, height 5\' 5"  (1.651 m), weight 92.987 kg (205 lb). Body mass index is 34.11 kg/(m^2). Lab Results:   No results found for this or any previous visit (from the past 72 hour(s)).  Physical Findings: AIMS: Facial and Oral Movements Muscles of Facial Expression: None, normal Lips and Perioral Area: None, normal Jaw: None, normal Tongue: None, normal,Extremity Movements Upper (arms, wrists, hands, fingers): None, normal Lower (legs, knees, ankles, toes): None, normal, Trunk Movements Neck, shoulders, hips: None, normal, Overall Severity Severity of abnormal movements (highest score from questions above): None, normal Incapacitation due to abnormal movements: None, normal Patient's awareness of abnormal movements (rate only patient's report): No Awareness, Dental Status Current problems with teeth and/or dentures?: No Does patient usually wear dentures?: No  CIWA:  CIWA-Ar Total: 1 COWS:  COWS Total Score: 3  Psychiatric Specialty Exam: See Psychiatric Specialty Exam and  Suicide Risk Assessment completed by Attending Physician prior to discharge.  Discharge destination:  Home  Is patient on multiple antipsychotic therapies at discharge:  No   Has Patient had three or more failed trials of antipsychotic monotherapy by history:  No  Recommended Plan for Multiple Antipsychotic Therapies: NA    Medication List    STOP taking these medications       chlordiazePOXIDE 25 MG capsule  Commonly known as:  LIBRIUM     promethazine 25 MG tablet  Commonly known as:  PHENERGAN      TAKE these medications     Indication   clonazePAM 1 MG tablet  Commonly known as:  KLONOPIN  Take 1-2 tablets (1-2 mg total) by mouth 3 (three) times daily. For panic disorder   Indication:  Panic Disorder     cloNIDine 0.1 MG tablet  Commonly known as:  CATAPRES  Take 1 tablet (0.1 mg total) by mouth 4 (four) times daily. For hypertension   Indication:  High Blood Pressure     hydrOXYzine 25 MG tablet  Commonly known as:  ATARAX/VISTARIL  Take 1 tablet (25 mg) three times daily & 2 tablets (50 mg) at bedtime as needed for anxiety.   Indication:  Tension, Anxiety     lamoTRIgine 25 MG tablet  Commonly known as:  LAMICTAL  Take 1 tablet (25 mg total) by mouth daily. For mood stabilization   Indication:  Mood stabilization     loperamide 2 MG capsule  Commonly known as:  IMODIUM  Take 1 capsule (2 mg total) by mouth as needed for diarrhea or loose stools (diarrhea).   Indication:  Diarrhea     methocarbamol 500 MG tablet  Commonly known as:  ROBAXIN  Take 1 tablet (500 mg total) by mouth every 8 (eight) hours as needed for muscle spasms (muscle spasm).   Indication:  Musculoskeletal Pain       Follow-up Information   Follow up with [facility]  On 07/27/2014. (Appt with Dr. [facility] on this date at 2:00PM for medication management. )    Contact information:   Attn: Dr. [facility] Care [address] [place], [place] [address] Phone: [phone] Fax:  [phone]      Follow up with Patient refused referral for therapy services. .     Follow-up recommendations:  Activity:  As tolerated Diet: As recommended by your primary care doctor. Keep all scheduled follow-up appointments as recommended.  Comments: Take all your medications as prescribed by your mental healthcare provider. Report any adverse effects and or reactions from your medicines to your outpatient provider promptly. Patient is instructed and cautioned to not engage in alcohol and or illegal drug use while on prescription medicines. In the event of worsening symptoms, patient is instructed to call the crisis hotline, 911 and or go to the nearest ED for appropriate evaluation and treatment of symptoms. Follow-up with your primary care provider for your other medical issues, concerns and or health care needs.     Total Discharge Time:  Greater than 30 minutes.  Signed: Encarnacion Slates, [place] 06/25/2014, 9:18 AM  I have personally seen the patient and agreed with the findings and involved in the treatment plan. Berniece Andreas, MD

## 2014-06-28 NOTE — Consult Note (Signed)
Case discussed, agree with plan 

## 2014-06-30 NOTE — Progress Notes (Signed)
Patient Discharge Instructions:  After Visit Summary (AVS):   Faxed to:  06/30/14 Discharge Summary Note:   Faxed to:  06/30/14 Psychiatric Admission Assessment Note:   Faxed to:  06/30/14 Suicide Risk Assessment - Discharge Assessment:   Faxed to:  06/30/14 Faxed/Sent to the Next [facility] Care provider:  06/30/14 Faxed to [facility] @ [phone] Patsey Berthold, 06/30/2014, 2:12 PM

## 2014-08-04 ENCOUNTER — Encounter: Payer: Self-pay | Admitting: Family Medicine

## 2014-08-04 ENCOUNTER — Ambulatory Visit (INDEPENDENT_AMBULATORY_CARE_PROVIDER_SITE_OTHER): Payer: BC Managed Care – PPO | Admitting: Family Medicine

## 2014-08-04 VITALS — BP 120/82 | HR 106 | Ht 73.0 in | Wt 175.0 lb

## 2014-08-04 DIAGNOSIS — Q762 Congenital spondylolisthesis: Secondary | ICD-10-CM

## 2014-08-04 DIAGNOSIS — M4317 Spondylolisthesis, lumbosacral region: Secondary | ICD-10-CM | POA: Insufficient documentation

## 2014-08-04 MED ORDER — MELOXICAM 15 MG PO TABS: 15.0000 mg | ORAL_TABLET | Freq: Every day | ORAL | Status: DC

## 2014-08-04 MED ORDER — CYCLOBENZAPRINE HCL 5 MG PO TABS: 5.0000 mg | ORAL_TABLET | Freq: Three times a day (TID) | ORAL | Status: DC | PRN

## 2014-08-04 NOTE — Assessment & Plan Note (Signed)
Patient does have a very small spine the noted at this level as well as a bilateral pars defect to give patient some mild increasing range of motion. Patient also has some benign hypermobility syndrome that could also be contributing to patient's discomfort. Encourage him that this is more of a chronic lumbar back strain and can be treated conservatively. Patient has had radiofrequency ablation previously as well as epidural steroid injections as well. Patient has had a history of opiate dependence and we will not start any opiate medications. Patient was given meloxicam as well as a very small dose of a muscle relaxer. Patient does not drink any alcohol and warned not to mix medications. We discussed an icing regimen and a home exercise program. We discussed proper lifting mechanics that could also be beneficial. We discussed over-the-counter medicines that will hopefully be helpful. Patient will try these interventions and come back again in 3 weeks. I am not optimistic that patient would actually respond well to osteopathic manipulation I do not think that he is a candidate. Patient will come back again in 3 weeks for further evaluation and treatment.

## 2014-08-04 NOTE — Patient Instructions (Signed)
It is very nice to meet you Ice 20 minutes 2 times daily. Usually after activity and before bed. Exercises 3 times a week.  Biking or swimming would be great at this time.  Meloxicam daily as needed Fish oil 3 grams daily Turmeric 500mg  2 times daily.  Vitamin D 2000 IU daily Come back in 3 weeks

## 2014-08-04 NOTE — Progress Notes (Signed)
  [name] [address] [place], [place] [address] Phone: [phone] Subjective:     CC: Back pain  BPZ:WCHENIDPOE [name] is a 33 y.o. male coming in with complaint of back pain. Patient does have a past medical history significant for benzodiazepine dependence, opiate dependence with withdrawal, anxiety and depression coming in with chronic low back pain. Patient has had this pain for quite some time and was seen primary care provider who is giving him narcotic medication for pain relief. Patient is disabled secondary to this pain. Patient has had further workup on this previously with MRIs of the cervical spine as well as the lumbar spine. These were reviewed by me today. Patient does have a pars defect at L5 as well as a 4 mm grade 1 anterolisthesis of L5 on S1 previously. There was no significant nerve root impingement. Patient states he continue to have pain. Continues to be chronic and stop him from doing any activity.  Mild radicular symptoms going down the left leg intermittently. No weakness, no numbness.   Pain 8/10 in severity. All the time, does wake him up at night, not responding over the counter medications.       Past medical history, social, surgical and family history all reviewed in electronic medical record.   Review of Systems: No headache, visual changes, nausea, vomiting, diarrhea, constipation, dizziness, abdominal pain, skin rash, fevers, chills, night sweats, weight loss, swollen lymph nodes, body aches, joint swelling, muscle aches, chest pain, shortness of breath, mood changes.   Objective Blood pressure 120/82, pulse 106, height 6\' 1"  (1.854 m), weight 175 lb (79.379 kg), SpO2 97.00%.  General: No apparent distress alert and oriented x3 mood and affect normal, dressed appropriately.  HEENT: Pupils equal, extraocular movements intact  Respiratory: Patient's speak in full sentences and does not appear short of breath    Cardiovascular: No lower extremity edema, non tender, no erythema  Skin: Warm dry intact with no signs of infection or rash on extremities or on axial skeleton.  Abdomen: Soft nontender  Neuro: Cranial nerves II through XII are intact, neurovascularly intact in all extremities with 2+ DTRs and 2+ pulses.  Lymph: No lymphadenopathy of posterior or anterior cervical chain or axillae bilaterally.  Gait normal with good balance and coordination.  MSK:  Non tender with full range of motion and good stability and symmetric strength and tone of shoulders, elbows, wrist, hip, knee and ankles bilaterally. Mild hypermobility syndrome.  Back Exam:  Inspection: Unremarkable  Motion: Flexion 35 deg, Extension 25 deg, Side Bending to 35 deg bilaterally,  Rotation to 25 deg bilaterally  SLR laying: Negative  XSLR laying: Negative  Palpable tenderness: Mild tenderness of the left paraspinal musculature and sacral area. FABER: negative. Sensory change: Gross sensation intact to all lumbar and sacral dermatomes.  Reflexes: 2+ at both patellar tendons, 2+ at achilles tendons, Babinski's downgoing.  Strength at foot  Plantar-flexion: 5/5 Dorsi-flexion: 5/5 Eversion: 5/5 Inversion: 5/5  Leg strength  Quad: 5/5 Hamstring: 5/5 Hip flexor: 4/5 Hip abductors: 4/5       Impression and Recommendations:     This case required medical decision making of moderate complexity.

## 2014-08-26 ENCOUNTER — Encounter: Payer: Self-pay | Admitting: Family Medicine

## 2014-08-26 ENCOUNTER — Ambulatory Visit (INDEPENDENT_AMBULATORY_CARE_PROVIDER_SITE_OTHER): Payer: BC Managed Care – PPO | Admitting: Family Medicine

## 2014-08-26 VITALS — BP 142/82 | HR 88 | Ht 73.0 in | Wt 190.0 lb

## 2014-08-26 DIAGNOSIS — Q762 Congenital spondylolisthesis: Secondary | ICD-10-CM

## 2014-08-26 DIAGNOSIS — M4317 Spondylolisthesis, lumbosacral region: Secondary | ICD-10-CM

## 2014-08-26 MED ORDER — CYCLOBENZAPRINE HCL 5 MG PO TABS: 5.0000 mg | ORAL_TABLET | Freq: Three times a day (TID) | ORAL | Status: DC | PRN

## 2014-08-26 MED ORDER — MELOXICAM 15 MG PO TABS: 15.0000 mg | ORAL_TABLET | Freq: Every day | ORAL | Status: DC

## 2014-08-26 NOTE — Progress Notes (Signed)
  [name] [address] [place], [place] [address] Phone: [phone] Subjective:     CC: Back pain follow up  UXL:KGMWNUUVOZ [name] is a 33 y.o. male coming in with complaint of back pain. Patient was seen previously for and pars defect at L5 as well as a 4 mm grade 1 anteriorlithesis L5 on S1. Patient does have a past medical history significant for benzodiazepine dependence, opiate dependence with withdrawal, anxiety and depression coming in with chronic low back pain. Patient was also found to have hypermobility syndrome. We discussed strengthening exercises more than stretching exercises that I would be beneficial. She was given the recommendation over-the-counter medications as well as an icing protocol. Medications include meloxicam and Flexeril and we avoided narcotic secondary to patient's history. Patient states  that with the exercises as well as the over-the-counter medicines he is made about 25-35% improvement. Patient states that overall he continues to have a dull aching pain but the sharp pain that he was having previously in any of the radicular symptoms have completely resolved. Patient states that he still has some pain at night. Continues to take the Flexeril on an as-needed basis as well as the meloxicam. No new symptoms. Patient is actually like to working out on a regular basis.      Past medical history, social, surgical and family history all reviewed in electronic medical record.   Review of Systems: No headache, visual changes, nausea, vomiting, diarrhea, constipation, dizziness, abdominal pain, skin rash, fevers, chills, night sweats, weight loss, swollen lymph nodes, body aches, joint swelling, muscle aches, chest pain, shortness of breath, mood changes.   Objective Blood pressure 142/82, pulse 88, height 6\' 1"  (1.854 m), weight 190 lb (86.183 kg), SpO2 97.00%.  General: No apparent distress alert and oriented x3 mood and affect  normal, dressed appropriately.  HEENT: Pupils equal, extraocular movements intact  Respiratory: Patient's speak in full sentences and does not appear short of breath  Cardiovascular: No lower extremity edema, non tender, no erythema  Skin: Warm dry intact with no signs of infection or rash on extremities or on axial skeleton.  Abdomen: Soft nontender  Neuro: Cranial nerves II through XII are intact, neurovascularly intact in all extremities with 2+ DTRs and 2+ pulses.  Lymph: No lymphadenopathy of posterior or anterior cervical chain or axillae bilaterally.  Gait normal with good balance and coordination.  MSK:  Non tender with full range of motion and good stability and symmetric strength and tone of shoulders, elbows, wrist, hip, knee and ankles bilaterally. Mild hypermobility syndrome.  Back Exam:  Inspection: Unremarkable  Motion: Flexion 35 deg, Extension 35 deg, Side Bending to 35 deg bilaterally,  Rotation to 25 deg bilaterally  SLR laying: Negative  XSLR laying: Negative  Palpable tenderness: Patient is nontender on exam today Sensory change: Gross sensation intact to all lumbar and sacral dermatomes.  Reflexes: 2+ at both patellar tendons, 2+ at achilles tendons, Babinski's downgoing.  Strength at foot  Plantar-flexion: 5/5 Dorsi-flexion: 5/5 Eversion: 5/5 Inversion: 5/5  Leg strength  Quad: 5/5 Hamstring: 5/5 Hip flexor: 4/5 Hip abductors: 4/5  Moderate improvement from previous exam    Impression and Recommendations:     This case required medical decision making of moderate complexity.

## 2014-08-26 NOTE — Assessment & Plan Note (Signed)
Patient is improving with conservative therapy. We encouraged him to continue to work on core strengthening exercises and showed him different lifting techniques that could be beneficial. We discussed an icing protocol and continuing the over-the-counter medicines. Patient was given a refill of the Flexeril as well as the meloxicam today. Patient is going to continue with his conservative therapy and come back in 6 weeks for further evaluation and treatment.

## 2014-08-26 NOTE — Patient Instructions (Addendum)
You are doing amazing.  Continue the vitamin D and the Turmeric indefinitely.  I will refill flexeril.  Ice is your friend after activity. Love the idea of more weightlifting.  Protein within 30 minutes after workout.  Come back in 6 weeks.

## 2014-09-07 ENCOUNTER — Ambulatory Visit (INDEPENDENT_AMBULATORY_CARE_PROVIDER_SITE_OTHER): Payer: BC Managed Care – PPO | Admitting: Internal Medicine

## 2014-09-07 ENCOUNTER — Encounter: Payer: Self-pay | Admitting: Internal Medicine

## 2014-09-07 ENCOUNTER — Other Ambulatory Visit (INDEPENDENT_AMBULATORY_CARE_PROVIDER_SITE_OTHER): Payer: BC Managed Care – PPO

## 2014-09-07 VITALS — BP 140/80 | HR 91 | Temp 98.3°F | Resp 18 | Ht 73.0 in | Wt 188.1 lb

## 2014-09-07 DIAGNOSIS — R030 Elevated blood-pressure reading, without diagnosis of hypertension: Secondary | ICD-10-CM

## 2014-09-07 DIAGNOSIS — M545 Low back pain, unspecified: Secondary | ICD-10-CM

## 2014-09-07 DIAGNOSIS — F419 Anxiety disorder, unspecified: Secondary | ICD-10-CM

## 2014-09-07 DIAGNOSIS — F1121 Opioid dependence, in remission: Secondary | ICD-10-CM

## 2014-09-07 DIAGNOSIS — F1021 Alcohol dependence, in remission: Secondary | ICD-10-CM

## 2014-09-07 LAB — BASIC METABOLIC PANEL
BUN: 12 mg/dL (ref 6–23)
CALCIUM: 9.8 mg/dL (ref 8.4–10.5)
CO2: 28 mEq/L (ref 19–32)
CREATININE: 0.9 mg/dL (ref 0.4–1.5)
Chloride: 102 mEq/L (ref 96–112)
GFR: 104.53 mL/min (ref >60.00)
Glucose, Bld: 102 mg/dL — ABNORMAL HIGH (ref 70–99)
Potassium: 3.7 mEq/L (ref 3.5–5.1)
Sodium: 138 mEq/L (ref 135–145)

## 2014-09-07 LAB — LIPID PANEL
CHOL/HDL RATIO: 6
Cholesterol: 200 mg/dL (ref 0–200)
HDL: 34.5 mg/dL — ABNORMAL LOW (ref >39.00)
NonHDL: 165.5
TRIGLYCERIDES: 253 mg/dL — AB (ref 0.0–149.0)
VLDL: 50.6 mg/dL — ABNORMAL HIGH (ref 0.0–40.0)

## 2014-09-07 LAB — LDL CHOLESTEROL, DIRECT: Direct LDL: 126.7 mg/dL

## 2014-09-07 NOTE — Progress Notes (Signed)
   Subjective:    Patient ID: [name], male    DOB: [birth year], 33 y.o.   MRN: [id number]  HPI The patient is a 33 YO man who is coming in today to establish care. He has PMH of pain medication addiction and withdrawal (fentanyl patches), benzo dependence (currently getting 240 pills of 1 mg clonazepam from his psychiatrist), back pain, anxiety. He is not currently having any new problems at this time. He is still having some back pain but is doing well with flexeril and meloxicam. He denies chest pains, SOB, abdominal pain.   Review of Systems  Constitutional: Negative for fever, activity change and appetite change.  HENT: Negative.   Eyes: Negative.   Respiratory: Negative for cough, chest tightness, shortness of breath and wheezing.   Cardiovascular: Negative for chest pain, palpitations and leg swelling.  Gastrointestinal: Negative for abdominal pain, diarrhea and constipation.  Musculoskeletal: Positive for arthralgias and back pain. Negative for gait problem and myalgias.  Skin: Negative.   Neurological: Negative for dizziness, weakness, light-headedness and headaches.  Psychiatric/Behavioral: Positive for dysphoric mood. Negative for suicidal ideas, sleep disturbance and self-injury. The patient is nervous/anxious.       Objective:   Physical Exam  Constitutional: He appears well-developed and well-nourished. No distress.  HENT:  Head: Normocephalic.  Eyes: EOM are normal.  Neck: Normal range of motion.  Cardiovascular: Normal rate and regular rhythm.   Pulmonary/Chest: Effort normal and breath sounds normal. No respiratory distress. He has no wheezes. He has no rales.  Abdominal: Soft. Bowel sounds are normal.  Neurological: He is alert. Coordination normal.  Skin: Skin is warm and dry.   Filed Vitals:   09/07/14 1314  BP: 140/80  Pulse: 91  Temp: 98.3 F (36.8 C)  TempSrc: Oral  Resp: 18  Height: 6\' 1"  (1.854 m)  Weight: 188 lb 1.9 oz (85.331 kg)  SpO2: 97%       Assessment & Plan:  Patient declines flu shot.

## 2014-09-07 NOTE — Assessment & Plan Note (Signed)
Patient does not want to discuss alcohol usage today.

## 2014-09-07 NOTE — Progress Notes (Signed)
Pre visit review using our clinic review tool, if applicable. No additional management support is needed unless otherwise documented below in the visit note. 

## 2014-09-07 NOTE — Assessment & Plan Note (Signed)
He does take clonazepam and hydroxyzine and very uncomfotable with his dosages (clonazepam 1mg  240 pills per month from mental health, hydroxyzine QID prn). He claims that he has tried numerous antidepressants and mood stabilizers with no success and side effects and never wants to try any again.

## 2014-09-07 NOTE — Patient Instructions (Signed)
We will check your blood work today and call you with the results. Work on increasing your level of activity as you are able.  Come back in about 1-2 years for a check up. Please feel free to call us sooner if you have questions or problems.   Stress and Stress Management Stress is a normal reaction to life events. It is what you feel when life demands more than you are used to or more than you can handle. Some stress can be useful. For example, the stress reaction can help you catch the last bus of the day, study for a test, or meet a deadline at work. But stress that occurs too often or for too long can cause problems. It can affect your emotional health and interfere with relationships and normal daily activities. Too much stress can weaken your immune system and increase your risk for physical illness. If you already have a medical problem, stress can make it worse. CAUSES  All sorts of life events may cause stress. An event that causes stress for one person may not be stressful for another person. Major life events commonly cause stress. These may be positive or negative. Examples include losing your job, moving into a new home, getting married, having a baby, or losing a loved one. Less obvious life events may also cause stress, especially if they occur day after day or in combination. Examples include working long hours, driving in traffic, caring for children, being in debt, or being in a difficult relationship. SIGNS AND SYMPTOMS Stress may cause emotional symptoms including, the following:  Anxiety. This is feeling worried, afraid, on edge, overwhelmed, or out of control.  Anger. This is feeling irritated or impatient.  Depression. This is feeling sad, down, helpless, or guilty.  Difficulty focusing, remembering, or making decisions. Stress may cause physical symptoms, including the following:   Aches and pains. These may affect your head, neck, back, stomach, or other areas of your  body.  Tight muscles or clenched jaw.  Low energy or trouble sleeping. Stress may cause unhealthy behaviors, including the following:   Eating to feel better (overeating) or skipping meals.  Sleeping too little, too much, or both.  Working too much or putting off tasks (procrastination).  Smoking, drinking alcohol, or using drugs to feel better. DIAGNOSIS  Stress is diagnosed through an assessment by your health care provider. Your health care provider will ask questions about your symptoms and any stressful life events.Your health care provider will also ask about your medical history and may order blood tests or other tests. Certain medical conditions and medicine can cause physical symptoms similar to stress. Mental illness can cause emotional symptoms and unhealthy behaviors similar to stress. Your health care provider may refer you to a mental health professional for further evaluation.  TREATMENT  Stress management is the recommended treatment for stress.The goals of stress management are reducing stressful life events and coping with stress in healthy ways.  Techniques for reducing stressful life events include the following:  Stress identification. Self-monitor for stress and identify what causes stress for you. These skills may help you to avoid some stressful events.  Time management. Set your priorities, keep a calendar of events, and learn to say "no." These tools can help you avoid making too many commitments. Techniques for coping with stress include the following:  Rethinking the problem. Try to think realistically about stressful events rather than ignoring them or overreacting. Try to find the positives in a stressful  situation rather than focusing on the negatives.  Exercise. Physical exercise can release both physical and emotional tension. The key is to find a form of exercise you enjoy and do it regularly.  Relaxation techniques. These relax the body and mind.  Examples include yoga, meditation, tai chi, biofeedback, deep breathing, progressive muscle relaxation, listening to music, being out in nature, journaling, and other hobbies. Again, the key is to find one or more that you enjoy and can do regularly.  Healthy lifestyle. Eat a balanced diet, get plenty of sleep, and do not smoke. Avoid using alcohol or drugs to relax.  Strong support network. Spend time with family, friends, or other people you enjoy being around.Express your feelings and talk things over with someone you trust. Counseling or talktherapy with a mental health professional may be helpful if you are having difficulty managing stress on your own. Medicine is typically not recommended for the treatment of stress.Talk to your health care provider if you think you need medicine for symptoms of stress. HOME CARE INSTRUCTIONS  Keep all follow-up visits as directed by your health care provider.  Take all medicines as directed by your health care provider. SEEK MEDICAL CARE IF:  Your symptoms get worse or you start having new symptoms.  You feel overwhelmed by your problems and can no longer manage them on your own. SEEK IMMEDIATE MEDICAL CARE IF:  You feel like hurting yourself or someone else. Document Released: 05/16/2001 Document Revised: 04/06/2014 Document Reviewed: 07/15/2013 [facility] Patient Information 2015 [place], [place]. This information is not intended to replace advice given to you by your health care provider. Make sure you discuss any questions you have with your health care provider.

## 2014-09-07 NOTE — Assessment & Plan Note (Signed)
Went through withdrawal and is now opioid free per report. He has been unable to produce urine specimen in the past.

## 2014-09-07 NOTE — Assessment & Plan Note (Signed)
He will continue to see sports medicine. On flexeril and meloxicam. Would not rx narcotics in the future.

## 2014-09-23 ENCOUNTER — Other Ambulatory Visit: Payer: Self-pay | Admitting: Family Medicine

## 2014-09-23 NOTE — Telephone Encounter (Signed)
Refill done.  

## 2014-10-07 ENCOUNTER — Ambulatory Visit (INDEPENDENT_AMBULATORY_CARE_PROVIDER_SITE_OTHER): Payer: BC Managed Care – PPO | Admitting: Family Medicine

## 2014-10-07 ENCOUNTER — Encounter: Payer: Self-pay | Admitting: Family Medicine

## 2014-10-07 VITALS — BP 140/84 | HR 104 | Ht 73.0 in | Wt 195.0 lb

## 2014-10-07 DIAGNOSIS — M4317 Spondylolisthesis, lumbosacral region: Secondary | ICD-10-CM

## 2014-10-07 MED ORDER — ETODOLAC 500 MG PO TABS: 500.0000 mg | ORAL_TABLET | Freq: Two times a day (BID) | ORAL | Status: DC

## 2014-10-07 NOTE — Progress Notes (Signed)
  [name] [place],  [address] Phone: [phone] Subjective:     CC: Back pain follow up  OQH:[id number] [name] is a 33 y.o. male coming in with complaint of back pain. Patient was seen previously for and pars defect at L5 as well as a 4 mm grade 1 anteriorlithesis L5 on S1.   Patient does have a past medical history significant for benzodiazepine dependence, opiate dependence with withdrawal, anxiety and depression coming in with chronic low back pain. Patient has been on meloxicam as well as Flexeril.  Patient was making some improvement with conservative therapy.patient was doing the over-the-counter medications as well as the home exercises. Patient states      Past medical history, social, surgical and family history all reviewed in electronic medical record.   Review of Systems: No headache, visual changes, nausea, vomiting, diarrhea, constipation, dizziness, abdominal pain, skin rash, fevers, chills, night sweats, weight loss, swollen lymph nodes, body aches, joint swelling, muscle aches, chest pain, shortness of breath, mood changes.   Objective There were no vitals taken for this visit.  General: No apparent distress alert and oriented x3 mood and affect normal, dressed appropriately.  HEENT: Pupils equal, extraocular movements intact  Respiratory: Patient's speak in full sentences and does not appear short of breath  Cardiovascular: No lower extremity edema, non tender, no erythema  Skin: Warm dry intact with no signs of infection or rash on extremities or on axial skeleton.  Abdomen: Soft nontender  Neuro: Cranial nerves II through XII are intact, neurovascularly intact in all extremities with 2+ DTRs and 2+ pulses.  Lymph: No lymphadenopathy of posterior or anterior cervical chain or axillae bilaterally.  Gait normal with good balance and coordination.  MSK:  Non tender with full range of motion and good stability  and symmetric strength and tone of shoulders, elbows, wrist, hip, knee and ankles bilaterally. Mild hypermobility syndrome.  Back Exam:  Inspection: Unremarkable  Motion: Flexion 35 deg, Extension 35 deg, Side Bending to 35 deg bilaterally,  Rotation to 25 deg bilaterally  SLR laying: Negative  XSLR laying: Negative  Palpable tenderness: Patient is nontender on exam today Sensory change: Gross sensation intact to all lumbar and sacral dermatomes.  Reflexes: 2+ at both patellar tendons, 2+ at achilles tendons, Babinski's downgoing.  Strength at foot  Plantar-flexion: 5/5 Dorsi-flexion: 5/5 Eversion: 5/5 Inversion: 5/5  Leg strength  Quad: 5/5 Hamstring: 5/5 Hip flexor: 4/5 Hip abductors: 4/5  Moderate improvement from previous exam    Impression and Recommendations:     This case required medical decision making of moderate complexity.

## 2014-10-07 NOTE — Assessment & Plan Note (Signed)
Patient is responding fairly well to conservative therapy at this time. Patient feels that the meloxicam does not hold him all days we will try a twice daily medication. Patient also given a trial of duexis to try. If this does make some improvement we may consider this medication so we will avoid any stomach pains. We discussed operative excision the medications. Patient continue the conservative over-the-counter medications as well. Patient was given more directions on core strengthening exercises in which exercises to potentially avoid. Patient was also given a running progression to try to increase jogging which is one of patient's goals. Patient then is going to follow up with me again in 6 weeks for further evaluation and treatment.  Spent greater than 25 minutes with patient face-to-face and had greater than 50% of counseling including as described above in assessment and plan.

## 2014-10-07 NOTE — Patient Instructions (Addendum)
Good to see you Ice is your friend I am ecstatic at the process you have made. Keep up the good work.  Continue the weight lifting and start jogging protocol.  Stop the meloxicam start  etdolac twice daily.  Conitnue the flexeril  See me gain in 6 weeks.  Start a walk-run progression: Only run 2-3 times a week.  - Initially start one minute walking than one minute running for 20 mins in the first week,   then 25 mins during the second week, then 30 mins afterwards.  Once you have reached 30 mins: - Run 2 mins, then walk 1 min. -Then run 3 mins, and walk 1 min. -Then run 4 mins, and walk 1 min. -Then run 5 mins, and walk 1 min. -Slowly build up weekly to running 30 mins nonstop.  If painful at any of the steps, back up one step.

## 2014-11-19 ENCOUNTER — Ambulatory Visit (INDEPENDENT_AMBULATORY_CARE_PROVIDER_SITE_OTHER): Payer: BC Managed Care – PPO | Admitting: Family Medicine

## 2014-11-19 ENCOUNTER — Encounter: Payer: Self-pay | Admitting: Family Medicine

## 2014-11-19 ENCOUNTER — Encounter: Payer: BC Managed Care – PPO | Admitting: Internal Medicine

## 2014-11-19 ENCOUNTER — Encounter: Payer: Self-pay | Admitting: Internal Medicine

## 2014-11-19 VITALS — BP 108/72 | HR 86 | Ht 73.0 in | Wt 194.0 lb

## 2014-11-19 DIAGNOSIS — M4317 Spondylolisthesis, lumbosacral region: Secondary | ICD-10-CM

## 2014-11-19 MED ORDER — CELECOXIB 200 MG PO CAPS: ORAL_CAPSULE | ORAL | Status: DC

## 2014-11-19 NOTE — Assessment & Plan Note (Signed)
Discuss he can with patient at great length. Due to patient having GI discomfort with the anti-inflammatories we will try Celebrex. Patient will try this as well as we discussed other over-the-counter medications that could be beneficial. We discussed and encouraged patient to do the exercises on a more regular basis. Patient will continue with the massage. We will continue to avoid pain medications in patient would like to avoid anything in the antidepressant medications. Patient and will come back and see me again in 4-6 weeks for further evaluation and treatment.  Spent greater than 25 minutes with patient face-to-face and had greater than 50% of counseling including as described above in assessment and plan.

## 2014-11-19 NOTE — Patient Instructions (Addendum)
Good to see you again.  Celebrex up to 2 times daily.  Conitnue the exercises.  Saffron 300mg  daily could help Check how much protein you are getting. My fitness pal.  Think of lean meats, [place], fish etc.  Try these changes and lets check in again in 4 weeks.

## 2014-11-19 NOTE — Progress Notes (Signed)
  [name] [place], [place] [address] Phone: [phone] Subjective:     CC: Back pain follow up  NOB:[id number] [name] is a 33 y.o. male coming in with complaint of back pain. Patient was seen previously for and pars defect at L5 as well as a 4 mm grade 1 anteriorlithesis L5 on S1.   Patient does have a past medical history significant for benzodiazepine dependence, opiate dependence with withdrawal, anxiety and depression coming in with chronic low back pain. Patient did have a change in his anti-inflammatory. Patient actually states he had to stop his anti-inflammatories due to GI discomfort.  Patient was making some improvement with conservative therapy.patient was doing the over-the-counter medications as well as the home exercises. Patient states unfortunately has had some worsening pain because he had to stop the anti-inflammatories. Patient continues to go with side. Patient has not been doing the exercises as well. Denies any significant worsening pain though.      Past medical history, social, surgical and family history all reviewed in electronic medical record.   Review of Systems: No headache, visual changes, nausea, vomiting, diarrhea, constipation, dizziness, abdominal pain, skin rash, fevers, chills, night sweats, weight loss, swollen lymph nodes, body aches, joint swelling, muscle aches, chest pain, shortness of breath, mood changes.   Objective Blood pressure 108/72, pulse 86, height 6\' 1"  (1.854 m), weight 194 lb (87.998 kg), SpO2 98 %.  General: No apparent distress alert and oriented x3 mood and affect normal, dressed appropriately.  HEENT: Pupils equal, extraocular movements intact  Respiratory: Patient's speak in full sentences and does not appear short of breath  Cardiovascular: No lower extremity edema, non tender, no erythema  Skin: Warm dry intact with no signs of infection or rash on extremities or on axial  skeleton.  Abdomen: Soft nontender  Neuro: Cranial nerves II through XII are intact, neurovascularly intact in all extremities with 2+ DTRs and 2+ pulses.  Lymph: No lymphadenopathy of posterior or anterior cervical chain or axillae bilaterally.  Gait normal with good balance and coordination.  MSK:  Non tender with full range of motion and good stability and symmetric strength and tone of shoulders, elbows, wrist, hip, knee and ankles bilaterally. Mild hypermobility syndrome.  Back Exam:  Inspection: Unremarkable  Motion: Flexion 35 deg, Extension 35 deg, Side Bending to 35 deg bilaterally,  Rotation to 25 deg bilaterally  SLR laying: Negative  XSLR laying: Negative  Palpable tenderness: Patient is nontender on exam today Sensory change: Gross sensation intact to all lumbar and sacral dermatomes.  Reflexes: 2+ at both patellar tendons, 2+ at achilles tendons, Babinski's downgoing.  Strength at foot  Plantar-flexion: 5/5 Dorsi-flexion: 5/5 Eversion: 5/5 Inversion: 5/5  Leg strength  Quad: 5/5 Hamstring: 5/5 Hip flexor: 4/5 Hip abductors: 4/5  No improvement from previous exam.    Impression and Recommendations:     This case required medical decision making of moderate complexity.

## 2014-11-20 NOTE — Progress Notes (Signed)
Encounter opened in error

## 2014-11-26 ENCOUNTER — Telehealth: Payer: Self-pay | Admitting: Family Medicine

## 2014-11-26 NOTE — Telephone Encounter (Signed)
Dr. Tamala Julian is treating patient for back pain.  Patient is requesting to be worked in today b/c he has not been able to get out of bed since Tuesday.

## 2014-11-26 NOTE — Telephone Encounter (Signed)
Per dr Tamala Julian. Please call pt & tell him that we are not going to be able to see him today. We are booked. If pt is in that much pain then Dr. Tamala Julian recommends for him to go to the ER.

## 2014-11-26 NOTE — Telephone Encounter (Signed)
Called patient back notified him of provider response.

## 2014-12-18 ENCOUNTER — Encounter: Payer: Self-pay | Admitting: Family Medicine

## 2014-12-18 ENCOUNTER — Other Ambulatory Visit: Payer: Self-pay | Admitting: Family Medicine

## 2014-12-18 ENCOUNTER — Ambulatory Visit (INDEPENDENT_AMBULATORY_CARE_PROVIDER_SITE_OTHER): Payer: BLUE CROSS/BLUE SHIELD | Admitting: Family Medicine

## 2014-12-18 VITALS — BP 114/66 | HR 109 | Ht 73.0 in | Wt 193.0 lb

## 2014-12-18 DIAGNOSIS — M545 Low back pain, unspecified: Secondary | ICD-10-CM

## 2014-12-18 MED ORDER — CYCLOBENZAPRINE HCL 5 MG PO TABS: 5.0000 mg | ORAL_TABLET | Freq: Three times a day (TID) | ORAL | Status: DC | PRN

## 2014-12-18 NOTE — Progress Notes (Signed)
[name] [place],  [address] Phone: [phone] Subjective:     CC: Back pain follow up  KPT:[id number] [name] is a 34 y.o. male coming in with complaint of back pain. Patient was seen previously for and pars defect at L5 as well as a 4 mm grade 1 anteriorlithesis L5 on S1.   Patient does have a past medical history significant for benzodiazepine dependence, opiate dependence with withdrawal, anxiety and depression coming in with chronic low back pain. Patient did have a change in his anti-inflammatory. Patient actually states he had to stop his anti-inflammatories due to GI discomfort.  Was doing fairly well with conservative therapy but did have a flare right before Christmas. Patient and forcefully had to go to the emergency department. Patient was given anti-inflammatories including tramadol. Patient states since then he is doing somewhat better. Patient has started on the Celebrex but has not notice any significant difference from his other anti-inflammatories. Patient continues to use the muscle relaxers as needed. Patient states he did not take the tramadol on a regular basis. Patient states that most of the soreness seems to be in the upper back. Patient did see another provider and was sent to formal physical therapy which has been somewhat beneficial. Patient states he is enjoying it and does have multiple visits tilt ago. Patient feels that there is some increase stiffness in the lower back recently.     Past medical history, social, surgical and family history all reviewed in electronic medical record.   Review of Systems: No headache, visual changes, nausea, vomiting, diarrhea, constipation, dizziness, abdominal pain, skin rash, fevers, chills, night sweats, weight loss, swollen lymph nodes, body aches, joint swelling, muscle aches, chest pain, shortness of breath, mood changes.   Objective Blood pressure 114/66, pulse  109, height 6\' 1"  (1.854 m), weight 193 lb (87.544 kg), SpO2 97 %.  General: No apparent distress alert and oriented x3 mood and affect normal, dressed appropriately.  HEENT: Pupils equal, extraocular movements intact  Respiratory: Patient's speak in full sentences and does not appear short of breath  Cardiovascular: No lower extremity edema, non tender, no erythema  Skin: Warm dry intact with no signs of infection or rash on extremities or on axial skeleton.  Abdomen: Soft nontender  Neuro: Cranial nerves II through XII are intact, neurovascularly intact in all extremities with 2+ DTRs and 2+ pulses.  Lymph: No lymphadenopathy of posterior or anterior cervical chain or axillae bilaterally.  Gait normal with good balance and coordination.  MSK:  Non tender with full range of motion and good stability and symmetric strength and tone of shoulders, elbows, wrist, hip, knee and ankles bilaterally. Mild hypermobility syndrome.  Back Exam:  Inspection: Unremarkable  Motion: Flexion 35 deg, Extension 35 deg, Side Bending to 35 deg bilaterally,  Rotation to 25 deg bilaterally  SLR laying: Negative  XSLR laying: Negative  Palpable tenderness: Minimal tenderness of the paraspinal musculature of the lumbar spine but thoracic spine seems unrelated. Sensory change: Gross sensation intact to all lumbar and sacral dermatomes.  Reflexes: 2+ at both patellar tendons, 2+ at achilles tendons, Babinski's downgoing.  Strength at foot  Plantar-flexion: 5/5 Dorsi-flexion: 5/5 Eversion: 5/5 Inversion: 5/5  Leg strength  Quad: 5/5 Hamstring: 5/5 Hip flexor: 4/5 Hip abductors: 4/5  Mild improvement in pain from previous exam.    Impression and Recommendations:     This case required medical decision making of moderate complexity.

## 2014-12-18 NOTE — Assessment & Plan Note (Signed)
Patient overall is a fairly difficult case. Patient will continue to work on the home exercises. I do think that formal physical therapy might be beneficial for this individual. No radiation down legs.  Patient I do think would fit from possible formal physical therapy will discuss this again in the future. We discussed continuing the different medications as well as stated previously. Patient will try the Celebrex and we'll see if he makes any improvement. Patient does not I would consider a anti-inflammatory that does have a pro time pump inhibitor to try to help with any type of abdominal pain. We discussed the icing regimen again as well as proper shoes. Patient will come back and see me again in 4 weeks for further evaluation and treatment.  Spent  25 minutes with patient face-to-face and had greater than 50% of counseling including as described above in assessment and plan.

## 2014-12-18 NOTE — Progress Notes (Signed)
Pre visit review using our clinic review tool, if applicable. No additional management support is needed unless otherwise documented below in the visit note. 

## 2014-12-18 NOTE — Patient Instructions (Signed)
Good to see you You are doing great Try switching back to the celebrex on Sunday.  If not helping try the new medicine 2 times a day.  If you like it we can continue.  Cotinue physical therapy.  When done we can re-evaluate. Then can start with our other guys.  Continue vitamin D See me again in 4 weeks.

## 2015-01-15 ENCOUNTER — Ambulatory Visit (INDEPENDENT_AMBULATORY_CARE_PROVIDER_SITE_OTHER): Payer: BLUE CROSS/BLUE SHIELD | Admitting: Family Medicine

## 2015-01-15 ENCOUNTER — Encounter: Payer: Self-pay | Admitting: Family Medicine

## 2015-01-15 VITALS — BP 130/82 | HR 118 | Ht 73.0 in | Wt 197.0 lb

## 2015-01-15 DIAGNOSIS — M4317 Spondylolisthesis, lumbosacral region: Secondary | ICD-10-CM

## 2015-01-15 DIAGNOSIS — M5416 Radiculopathy, lumbar region: Secondary | ICD-10-CM

## 2015-01-15 DIAGNOSIS — M5414 Radiculopathy, thoracic region: Secondary | ICD-10-CM

## 2015-01-15 NOTE — Progress Notes (Signed)
  [name] [place], [place] [address] Phone: [phone] Subjective:     CC: Back pain follow up  OVZ:CHYIFOYDXA [name] is a 34 y.o. male coming in with complaint of back pain. Patient was seen previously for and pars defect at L5 as well as a 4 mm grade 1 anteriorlithesis L5 on S1.   Was doing fairly well with conservative therapy.  Patient though unfortunately was having somewhat of a flare at last follow-up. Patient does have Lidoderm patches, Flexeril, and is trying Celebrex. Patient was also given a trial of another anti-inflammatory. Patient continues with the natural vitamins as well.patient states overall he does okay. Patient has been go to physical therapy but does not feel he is getting the proper attention that he needs. Patient would like to go back to a different facility he's gone to prior. Patient would like to also have them include his lower back. Patient states that during the day he still feels much better and the mornings and seems to get more uncomfortable at night. Patient has not increased the protein supplementation we discussed previously. Patient was taken the vitamins regularly. Patient continues to take ibuprofen 800 mg with H2 blocker. Patient denies any new symptoms but not significant improvement from previous exam.  Past medical history, social, surgical and family history all reviewed in electronic medical record. Patient does have a past medical history significant for benzodiazepine dependence, opiate dependence with withdrawal, anxiety and depression.  Review of Systems: No headache, visual changes, nausea, vomiting, diarrhea, constipation, dizziness, abdominal pain, skin rash, fevers, chills, night sweats, weight loss, swollen lymph nodes, body aches, joint swelling, muscle aches, chest pain, shortness of breath, mood changes.   Objective Blood pressure 130/82, pulse 118, height 6\' 1"  (1.854 m), weight 197 lb  (89.359 kg), SpO2 97 %.  General: No apparent distress alert and oriented x3 mood and affect normal, dressed appropriately.  HEENT: Pupils equal, extraocular movements intact  Respiratory: Patient's speak in full sentences and does not appear short of breath  Cardiovascular: No lower extremity edema, non tender, no erythema  Skin: Warm dry intact with no signs of infection or rash on extremities or on axial skeleton.  Abdomen: Soft nontender  Neuro: Cranial nerves II through XII are intact, neurovascularly intact in all extremities with 2+ DTRs and 2+ pulses.  Lymph: No lymphadenopathy of posterior or anterior cervical chain or axillae bilaterally.  Gait normal with good balance and coordination.  MSK:  Non tender with full range of motion and good stability and symmetric strength and tone of shoulders, elbows, wrist, hip, knee and ankles bilaterally. Mild hypermobility syndrome.  Back Exam:  Inspection: Unremarkable  Motion: Flexion 35 deg, Extension 35 deg, Side Bending to 35 deg bilaterally,  Rotation to 30 deg bilaterally  SLR laying: Negative  XSLR laying: Negative  Palpable tenderness: Minimal tenderness of the paraspinal musculature of the lumbar spine and thoracic spine Sensory change: Gross sensation intact to all lumbar and sacral dermatomes.  Reflexes: 2+ at both patellar tendons, 2+ at achilles tendons, Babinski's downgoing.  Strength at foot  Plantar-flexion: 5/5 Dorsi-flexion: 5/5 Eversion: 5/5 Inversion: 5/5  Leg strength  Quad: 5/5 Hamstring: 5/5 Hip flexor: 4/5 Hip abductors: 4/5  Minimal change from previous exam    Impression and Recommendations:     This case required medical decision making of moderate complexity.

## 2015-01-15 NOTE — Patient Instructions (Addendum)
Good to see you.  Conitnue the exercises We will try PT with [facility], they will be calling you Ice is still your friend.  Love the vitamins Adding 325mg  tylenol 3 times a day We can consider labs at a later date if we never breakthrough.  Consider decreasing oxalate levels level.

## 2015-01-15 NOTE — Progress Notes (Signed)
Pre visit review using our clinic review tool, if applicable. No additional management support is needed unless otherwise documented below in the visit note. 

## 2015-01-15 NOTE — Assessment & Plan Note (Signed)
Still seems to be mostly muscular in nature. We discussed the Celebrex again which patient does not think that is improving. Patient does have a muscle relaxers for at night. We discussed continuing the icing regimen and the home exercises. Patient has been referred to the other formal physical therapy that I think be helpful hopefully. he'll try to make these changes and come back and see me againin 4-6 weeks. Patient continues to have difficult he I would consider workup at the labs including ANA, CRP, rheumatoid factor, uric acid, and possibly oxalate level.

## 2015-02-15 ENCOUNTER — Ambulatory Visit (INDEPENDENT_AMBULATORY_CARE_PROVIDER_SITE_OTHER): Payer: BLUE CROSS/BLUE SHIELD | Admitting: Family Medicine

## 2015-02-15 ENCOUNTER — Encounter: Payer: Self-pay | Admitting: Family Medicine

## 2015-02-15 VITALS — BP 140/84 | HR 136 | Ht 73.0 in | Wt 196.0 lb

## 2015-02-15 DIAGNOSIS — M4317 Spondylolisthesis, lumbosacral region: Secondary | ICD-10-CM

## 2015-02-15 NOTE — Assessment & Plan Note (Signed)
Patient is making some significant strides at this time. Encourage patient to continue to avoid the medications if he can. Patient is doing very well and I think that his anxiety is improving as well which think hopefully we'll be helping some other somatic dysfunction. Patient encouraged to continue with the physical therapy. Patient and will see me again in 2-3 months for further evaluation.  Spent  25 minutes with patient face-to-face and had greater than 50% of counseling including as described above in assessment and plan.

## 2015-02-15 NOTE — Progress Notes (Signed)
Pre visit review using our clinic review tool, if applicable. No additional management support is needed unless otherwise documented below in the visit note. 

## 2015-02-15 NOTE — Progress Notes (Signed)
  [name] [address] [place], [place] [address] Phone: [phone] Subjective:     CC: Back pain follow up  [name] is a 34 y.o. male coming in with complaint of back pain. Patient was seen previously for and pars defect at L5 as well as a 4 mm grade 1 anteriorlithesis L5 on S1.   Was doing fairly well with conservative therapy.  Patient though unfortunately was having somewhat of a flare at last follow-up. Patient does have Lidoderm patches, Flexeril, and is trying Celebrex. Patient was also given a trial of another anti-inflammatory. Patient continues with the natural vitamins as well.patient states overall he does okay. Patient has been go to physical therapy but does not feel he is getting the proper attention that he needs. Patient was to change to another physical therapy office. Patient states this is the best he has felt in multiple months. Patient states he's had days where he is actually been pain-free. Patient has days where he has not taken any of his pain medications. Patient has been very happy with the results. Patient does not know what has changed drastically but has been doing some new exercises which he thinks is beneficial.  Past medical history, social, surgical and family history all reviewed in electronic medical record. Patient does have a past medical history significant for benzodiazepine dependence, opiate dependence with withdrawal, anxiety and depression.  Review of Systems: No headache, visual changes, nausea, vomiting, diarrhea, constipation, dizziness, abdominal pain, skin rash, fevers, chills, night sweats, weight loss, swollen lymph nodes, body aches, joint swelling, muscle aches, chest pain, shortness of breath, mood changes.   Objective Blood pressure 140/84, pulse 136, height 6\' 1"  (1.854 m), weight 196 lb (88.905 kg), SpO2 98 %.  General: No apparent distress alert and oriented x3 mood and affect normal,  dressed appropriately.  HEENT: Pupils equal, extraocular movements intact  Respiratory: Patient's speak in full sentences and does not appear short of breath  Cardiovascular: No lower extremity edema, non tender, no erythema  Skin: Warm dry intact with no signs of infection or rash on extremities or on axial skeleton.  Abdomen: Soft nontender  Neuro: Cranial nerves II through XII are intact, neurovascularly intact in all extremities with 2+ DTRs and 2+ pulses.  Lymph: No lymphadenopathy of posterior or anterior cervical chain or axillae bilaterally.  Gait normal with good balance and coordination.  MSK:  Non tender with full range of motion and good stability and symmetric strength and tone of shoulders, elbows, wrist, hip, knee and ankles bilaterally. Mild hypermobility syndrome.  Back Exam:  Inspection: Unremarkable  Motion: Flexion 35 deg, Extension 35 deg, Side Bending to 35 deg bilaterally,  Rotation to 30 deg bilaterally  SLR laying: Negative  XSLR laying: Negative  Palpable tenderness: Nontender on exam   Reflexes: 2+ at both patellar tendons, 2+ at achilles tendons, Babinski's downgoing.  Strength at foot  Plantar-flexion: 5/5 Dorsi-flexion: 5/5 Eversion: 5/5 Inversion: 5/5  Leg strength  Quad: 5/5 Hamstring: 5/5 Hip flexor: 4/5 Hip abductors: 4/5  Improvement from previous exam    Impression and Recommendations:     This case required medical decision making of moderate complexity.

## 2015-02-15 NOTE — Patient Instructions (Addendum)
Good to see you I can't believe how good you are doing.  Keep doing what you doing.  Ice is your friend Try to titrate down on the ibuprofen consider 600mg  for 2 weeks then 400mg  for 2 weeks, etc See me again in 2-3 months as along as you do well.

## 2015-04-04 ENCOUNTER — Other Ambulatory Visit: Payer: Self-pay | Admitting: Family Medicine

## 2015-04-05 NOTE — Telephone Encounter (Signed)
Refill done.  

## 2015-05-31 ENCOUNTER — Ambulatory Visit (INDEPENDENT_AMBULATORY_CARE_PROVIDER_SITE_OTHER): Payer: BLUE CROSS/BLUE SHIELD | Admitting: Family Medicine

## 2015-05-31 ENCOUNTER — Encounter: Payer: Self-pay | Admitting: Family Medicine

## 2015-05-31 VITALS — BP 122/74 | HR 89 | Ht 73.0 in | Wt 194.0 lb

## 2015-05-31 DIAGNOSIS — M4317 Spondylolisthesis, lumbosacral region: Secondary | ICD-10-CM

## 2015-05-31 NOTE — Progress Notes (Signed)
Pre visit review using our clinic review tool, if applicable. No additional management support is needed unless otherwise documented below in the visit note. 

## 2015-05-31 NOTE — Patient Instructions (Signed)
Enjoy the car You are doing great I am impressed.  Ice is your friend Look into tart cherry extract for muscle soreness I am good with the creatine.  Keep working ou, the best I have seen you See me when you need me.

## 2015-05-31 NOTE — Progress Notes (Signed)
  [name] [address] [place], [place] [address] Phone: [phone] Subjective:     CC: Back pain follow up  [id number] [name] is a 34 y.o. male coming in with complaint of back pain. Patient was seen previously for and pars defect at L5 as well as a 4 mm grade 1 anteriorlithesis L5 on S1.   Was doing fairly well with conservative therapy. Patient states that he has not needed any muscle relaxer, any pain medication, or any anti-inflammatory. Patient continues with the natural supplementation. Working on a regular basis and has noticed improvement with creatine. Patient is very happy with the results at this time.  Past medical history, social, surgical and family history all reviewed in electronic medical record. Patient does have a past medical history significant for benzodiazepine dependence, opiate dependence with withdrawal, anxiety and depression.  Review of Systems: No headache, visual changes, nausea, vomiting, diarrhea, constipation, dizziness, abdominal pain, skin rash, fevers, chills, night sweats, weight loss, swollen lymph nodes, body aches, joint swelling, muscle aches, chest pain, shortness of breath, mood changes.   Objective Blood pressure 122/74, pulse 89, height 6\' 1"  (1.854 m), weight 194 lb (87.998 kg), SpO2 97 %.  General: No apparent distress alert and oriented x3 mood and affect normal, dressed appropriately.  HEENT: Pupils equal, extraocular movements intact  Respiratory: Patient's speak in full sentences and does not appear short of breath  Cardiovascular: No lower extremity edema, non tender, no erythema  Skin: Warm dry intact with no signs of infection or rash on extremities or on axial skeleton.  Abdomen: Soft nontender  Neuro: Cranial nerves II through XII are intact, neurovascularly intact in all extremities with 2+ DTRs and 2+ pulses.  Lymph: No lymphadenopathy of posterior or anterior cervical chain or axillae  bilaterally.  Gait normal with good balance and coordination.  MSK:  Non tender with full range of motion and good stability and symmetric strength and tone of shoulders, elbows, wrist, hip, knee and ankles bilaterally. Mild hypermobility syndrome. Or muscles mass than previous exam Back Exam:  Inspection: Unremarkable considerable more muscle mass than previous exam Motion: Flexion 35 deg, Extension 35 deg, Side Bending to 35 deg bilaterally,  Rotation to 30 deg bilaterally  SLR laying: Negative  XSLR laying: Negative  Palpable tenderness: Nontender on exam   Reflexes: 2+ at both patellar tendons, 2+ at achilles tendons, Babinski's downgoing.  Strength at foot  Plantar-flexion: 5/5 Dorsi-flexion: 5/5 Eversion: 5/5 Inversion: 5/5  Leg strength  Quad: 5/5 Hamstring: 5/5 Hip flexor: 4/5 Hip abductors: 5/5      Impression and Recommendations:     This case required medical decision making of moderate complexity.

## 2015-05-31 NOTE — Assessment & Plan Note (Signed)
Truly impressive patient's motivation and continued dedication to improving. Discussed with patient to continue to be active as possible. Patient is in a good place in his life. Patient in follow-up with me as needed.

## 2015-07-04 ENCOUNTER — Encounter (HOSPITAL_BASED_OUTPATIENT_CLINIC_OR_DEPARTMENT_OTHER): Payer: Self-pay | Admitting: *Deleted

## 2015-07-04 ENCOUNTER — Emergency Department (HOSPITAL_BASED_OUTPATIENT_CLINIC_OR_DEPARTMENT_OTHER): Payer: BLUE CROSS/BLUE SHIELD

## 2015-07-04 ENCOUNTER — Emergency Department (HOSPITAL_BASED_OUTPATIENT_CLINIC_OR_DEPARTMENT_OTHER)
Admission: EM | Admit: 2015-07-04 | Discharge: 2015-07-04 | Disposition: A | Discharge status: Home / Self Care | Payer: BLUE CROSS/BLUE SHIELD | Attending: Emergency Medicine | Admitting: Emergency Medicine

## 2015-07-04 DIAGNOSIS — Z872 Personal history of diseases of the skin and subcutaneous tissue: Secondary | ICD-10-CM | POA: Insufficient documentation

## 2015-07-04 DIAGNOSIS — K5732 Diverticulitis of large intestine without perforation or abscess without bleeding: Secondary | ICD-10-CM | POA: Diagnosis not present

## 2015-07-04 DIAGNOSIS — Z79899 Other long term (current) drug therapy: Secondary | ICD-10-CM | POA: Diagnosis not present

## 2015-07-04 DIAGNOSIS — F418 Other specified anxiety disorders: Secondary | ICD-10-CM | POA: Insufficient documentation

## 2015-07-04 DIAGNOSIS — R109 Unspecified abdominal pain: Secondary | ICD-10-CM

## 2015-07-04 DIAGNOSIS — Z87891 Personal history of nicotine dependence: Secondary | ICD-10-CM | POA: Insufficient documentation

## 2015-07-04 DIAGNOSIS — Z8781 Personal history of (healed) traumatic fracture: Secondary | ICD-10-CM | POA: Insufficient documentation

## 2015-07-04 DIAGNOSIS — R10A0 Flank pain, unspecified side: Secondary | ICD-10-CM

## 2015-07-04 DIAGNOSIS — Z8669 Personal history of other diseases of the nervous system and sense organs: Secondary | ICD-10-CM | POA: Insufficient documentation

## 2015-07-04 DIAGNOSIS — R1032 Left lower quadrant pain: Secondary | ICD-10-CM | POA: Diagnosis present

## 2015-07-04 LAB — I-STAT CHEM 8, ED
BUN: 14 mg/dL (ref 6–20)
CALCIUM ION: 1.28 mmol/L — AB (ref 1.12–1.23)
Chloride: 107 mmol/L (ref 101–111)
Creatinine, Ser: 1 mg/dL (ref 0.61–1.24)
GLUCOSE: 108 mg/dL — AB (ref 65–99)
HEMATOCRIT: 47 % (ref 39.0–52.0)
Hemoglobin: 16 g/dL (ref 13.0–17.0)
Potassium: 4 mmol/L (ref 3.5–5.1)
Sodium: 141 mmol/L (ref 135–145)
TCO2: 23 mmol/L (ref 0–100)

## 2015-07-04 LAB — CBC WITH DIFFERENTIAL/PLATELET
BASOS ABS: 0 10*3/uL (ref 0.0–0.1)
Basophils Relative: 0 % (ref 0–1)
Eosinophils Absolute: 0.3 10*3/uL (ref 0.0–0.7)
Eosinophils Relative: 3 % (ref 0–5)
HCT: 44.6 % (ref 39.0–52.0)
HEMOGLOBIN: 15 g/dL (ref 13.0–17.0)
LYMPHS ABS: 3.2 10*3/uL (ref 0.7–4.0)
Lymphocytes Relative: 33 % (ref 12–46)
MCH: 28.9 pg (ref 26.0–34.0)
MCHC: 33.6 g/dL (ref 30.0–36.0)
MCV: 85.9 fL (ref 78.0–100.0)
MONOS PCT: 10 % (ref 3–12)
Monocytes Absolute: 0.9 10*3/uL (ref 0.1–1.0)
Neutro Abs: 5.1 10*3/uL (ref 1.7–7.7)
Neutrophils Relative %: 54 % (ref 43–77)
Platelets: 342 10*3/uL (ref 150–400)
RBC: 5.19 MIL/uL (ref 4.22–5.81)
RDW: 13.9 % (ref 11.5–15.5)
WBC: 9.5 10*3/uL (ref 4.0–10.5)

## 2015-07-04 LAB — URINALYSIS, ROUTINE W REFLEX MICROSCOPIC
Bilirubin Urine: NEGATIVE
GLUCOSE, UA: NEGATIVE mg/dL
Hgb urine dipstick: NEGATIVE
Ketones, ur: NEGATIVE mg/dL
LEUKOCYTES UA: NEGATIVE
Nitrite: NEGATIVE
PH: 6.5 (ref 5.0–8.0)
Protein, ur: NEGATIVE mg/dL
Specific Gravity, Urine: 1.006 (ref 1.005–1.030)
Urobilinogen, UA: 0.2 mg/dL (ref 0.0–1.0)

## 2015-07-04 MED ORDER — ONDANSETRON 4 MG PO TBDP: 4.0000 mg | ORAL_TABLET | Freq: Three times a day (TID) | ORAL | Status: DC | PRN

## 2015-07-04 MED ORDER — HYDROCODONE-ACETAMINOPHEN 5-325 MG PO TABS: 2.0000 | ORAL_TABLET | ORAL | Status: DC | PRN

## 2015-07-04 MED ORDER — CIPROFLOXACIN HCL 500 MG PO TABS
500.0000 mg | ORAL_TABLET | Freq: Once | ORAL | Status: AC
  Administered 2015-07-04: 500 mg via ORAL
  Filled 2015-07-04: qty 1

## 2015-07-04 MED ORDER — METRONIDAZOLE 500 MG PO TABS
500.0000 mg | ORAL_TABLET | Freq: Once | ORAL | Status: AC
  Administered 2015-07-04: 500 mg via ORAL
  Filled 2015-07-04: qty 1

## 2015-07-04 MED ORDER — METRONIDAZOLE 500 MG PO TABS: 500.0000 mg | ORAL_TABLET | Freq: Two times a day (BID) | ORAL | Status: DC

## 2015-07-04 MED ORDER — CIPROFLOXACIN HCL 500 MG PO TABS: 500.0000 mg | ORAL_TABLET | Freq: Two times a day (BID) | ORAL | Status: DC

## 2015-07-04 NOTE — ED Notes (Signed)
Lower left abd pain x 3 days denies fever N/V/D urinary sx

## 2015-07-04 NOTE — Discharge Instructions (Signed)

## 2015-07-08 ENCOUNTER — Ambulatory Visit (INDEPENDENT_AMBULATORY_CARE_PROVIDER_SITE_OTHER): Payer: BLUE CROSS/BLUE SHIELD | Admitting: Internal Medicine

## 2015-07-08 ENCOUNTER — Encounter: Payer: Self-pay | Admitting: Internal Medicine

## 2015-07-08 VITALS — BP 132/90 | HR 91 | Temp 98.4°F | Resp 18 | Wt 195.0 lb

## 2015-07-08 DIAGNOSIS — K5732 Diverticulitis of large intestine without perforation or abscess without bleeding: Secondary | ICD-10-CM | POA: Diagnosis not present

## 2015-07-08 MED ORDER — CIPROFLOXACIN HCL 500 MG PO TABS: 500.0000 mg | ORAL_TABLET | Freq: Two times a day (BID) | ORAL | Status: DC

## 2015-07-08 MED ORDER — ACETAMINOPHEN-CODEINE #3 300-30 MG PO TABS: 1.0000 | ORAL_TABLET | Freq: Four times a day (QID) | ORAL | Status: DC | PRN

## 2015-07-08 NOTE — Progress Notes (Signed)
Pre visit review using our clinic review tool, if applicable. No additional management support is needed unless otherwise documented below in the visit note. 

## 2015-07-08 NOTE — Progress Notes (Signed)
   Subjective:    Patient ID: [name], male    DOB: 1981/02/08, 34 y.o.   MRN: [id number]  HPI  He was seen in the emergency room 07/04/15 with acute diverticulitis of the descending colon on CT scan. He continues to have dull-sharp pain. He describes this as coming in waves when he supine. Today he had 3 loose to watery stools. He has been compliant with his metronidazole and Cipro.  He states he's been taking a "pre-biotic". He states this contains a probiotic as well.  He is requesting medication for pain. He received 10 Vicodin from the emergency room; he has completed these. He has a history of chronic back pain.  He has a history of alcohol abuse and benzodiazepine dependency. He states he is not drinking alcohol with metronidazole.      Review of Systems he denies fever, chills, or sweats.  There's been no melanoma or rectal bleeding.  He also has no dysuria, pyuria, hematuria.     Objective:   Physical Exam  General appearance :adequately nourished; in no distress.  Eyes: No conjunctival inflammation or scleral icterus is present.  Oral exam:  Lips and gums are healthy appearing.There is no oropharyngeal erythema or exudate noted. Dental hygiene is good.  Heart:  Normal rate and regular rhythm. S1 and S2 normal without gallop, murmur, click, rub or other extra sounds    Lungs:Chest clear to auscultation; no wheezes, rhonchi,rales ,or rubs present.No increased work of breathing.   Abdomen: bowel sounds hyperactive, very soft and non-tender without masses, organomegaly or hernias noted.  No guarding or rebound.   Vascular : all pulses equal ; no bruits present.  Skin:Warm & dry.  Intact without suspicious lesions or rashes ; no tenting or jaundice   Lymphatic: No lymphadenopathy is noted about the head, neck, axilla.   Neuro: Strength, tone normal.        Assessment & Plan:  #1 acute diverticulitis without hemorrhage. Clinically there is no evidence of  perforation or ileus. Because of the ongoing pain; the metronidazole will be increased 3 times a day and Cipro added for 48-72 hours. Clear liquid diet will be continued.

## 2015-07-08 NOTE — Patient Instructions (Signed)
Stay on clear liquids for 48-72 hours or until bowels are normal.This would include  jello, sherbert (NOT ice cream), [employer] chicken noodle soup(NOT cream based soups),Gatorade Lite, flat Ginger ale (without High Fructose Corn Syrup),dry toast or crackers, baked potato.No milk , dairy or grease until bowels are formed. Align , a [employer] , daily if stools are loose. To ER for  increasing pain, fever or rectal bleeding.  Take Metronidazole one pill 3 times a day.  Take the Cipro twice a day for at least a total of 7 days. It can be discontinued at that time if your symptoms have resolved.

## 2015-07-09 NOTE — ED Provider Notes (Signed)
CSN: [id number]     Arrival date & time 07/04/15  1233 History   First MD Initiated Contact with Patient 07/04/15 1343     Chief Complaint  Patient presents with  . Abdominal Pain      HPI  She presents evaluation of left lower abdominal pain. Since his pain is minimal but persisting over the last 3-4 days. He was concerned because he "read about it" was concern he may have diverticulitis. There had a previous episode. His never had a diagnosis of diverticuli. Never had a colonoscopy. No foreign travel. No bowel movement today. A liquid stool yesterday without blood. Mild nausea and poor appetite today.  Past Medical History  Diagnosis Date  . Tension headache   . Anxiety and depression   . Alcohol abuse   . Skull fracture 2004    Trauma  . Pilonidal cyst   . Difficulty urinating     cant urinate in public places (restrooms)   Past Surgical History  Procedure Laterality Date  . Vasectomy  04/2010  . Pilonidal cystectomy  08/01/2011  . Orif right 3rd finger  2012    Dr. Fredna Dow - see hospital notes.  . Radiofrequency ablation nerves  1/7/214   Family History  Problem Relation Age of Onset  . Depression Mother   . Anxiety disorder Mother   . Alcohol abuse Mother   . Hypertension Father   . Hyperlipidemia Father   . Coronary artery disease Neg Hx   . Prostate cancer Neg Hx   . Colon cancer Neg Hx   . Diabetes Neg Hx   . Cancer Paternal Aunt     breast   History  Substance Use Topics  . Smoking status: Former Smoker    Types: Cigarettes  . Smokeless tobacco: Never Used     Comment: quit 2 or more years  . Alcohol Use: No    Review of Systems  Constitutional: Negative for fever, chills, diaphoresis, appetite change and fatigue.  HENT: Negative for mouth sores, sore throat and trouble swallowing.   Eyes: Negative for visual disturbance.  Respiratory: Negative for cough, chest tightness, shortness of breath and wheezing.   Cardiovascular: Negative for chest pain.    Gastrointestinal: Positive for nausea and abdominal pain. Negative for vomiting, diarrhea and abdominal distention.  Endocrine: Negative for polydipsia, polyphagia and polyuria.  Genitourinary: Negative for dysuria, frequency and hematuria.  Musculoskeletal: Negative for gait problem.  Skin: Negative for color change, pallor and rash.  Neurological: Negative for dizziness, syncope, light-headedness and headaches.  Hematological: Does not bruise/bleed easily.  Psychiatric/Behavioral: Negative for behavioral problems and confusion.      Allergies  Avelox; Fluoxetine; Other; and Moxifloxacin  Home Medications   Prior to Admission medications   Medication Sig Start Date End Date Taking? Authorizing Provider  chlordiazePOXIDE (LIBRIUM) 10 MG capsule Take 10 mg by mouth 4 (four) times daily as needed for anxiety. 1-2 tablet 4 times daily.   Yes Historical Provider, MD  acetaminophen-codeine (TYLENOL #3) 300-30 MG per tablet Take 1 tablet by mouth every 6 (six) hours as needed for moderate pain. 07/08/15   Hendricks Limes, MD  cholecalciferol (VITAMIN D) 1000 UNITS tablet Take 1,000 Units by mouth 2 (two) times daily.    Historical Provider, MD  ciprofloxacin (CIPRO) 500 MG tablet Take 1 tablet (500 mg total) by mouth every 12 (twelve) hours. 07/08/15   Hendricks Limes, MD  HYDROcodone-acetaminophen (NORCO/VICODIN) 5-325 MG per tablet Take 2 tablets by mouth every 4 (  four) hours as needed. 07/04/15   Tanna Furry, MD  metroNIDAZOLE (FLAGYL) 500 MG tablet Take 1 tablet (500 mg total) by mouth 2 (two) times daily. 07/04/15   Tanna Furry, MD  omega-3 acid ethyl esters (LOVAZA) 1 G capsule Take by mouth 2 (two) times daily.    Historical Provider, MD  ondansetron (ZOFRAN ODT) 4 MG disintegrating tablet Take 1 tablet (4 mg total) by mouth every 8 (eight) hours as needed for nausea. 07/04/15   Tanna Furry, MD  TURMERIC PO Take by mouth 2 (two) times daily.    Historical Provider, MD   BP 113/64 mmHg  Pulse  66  Temp(Src) 97.9 F (36.6 C) (Oral)  Resp 16  Wt 195 lb (88.451 kg)  SpO2 99% Physical Exam  Constitutional: He is oriented to person, place, and time. He appears well-developed and well-nourished. No distress.  HENT:  Head: Normocephalic.  Eyes: Conjunctivae are normal. Pupils are equal, round, and reactive to light. No scleral icterus.  Neck: Normal range of motion. Neck supple. No thyromegaly present.  Cardiovascular: Normal rate and regular rhythm.  Exam reveals no gallop and no friction rub.   No murmur heard. Pulmonary/Chest: Effort normal and breath sounds normal. No respiratory distress. He has no wheezes. He has no rales.  Abdominal: Soft. Bowel sounds are normal. He exhibits no distension. There is tenderness. There is no rebound.    Musculoskeletal: Normal range of motion.  Neurological: He is alert and oriented to person, place, and time.  Skin: Skin is warm and dry. No rash noted.  Psychiatric: He has a normal mood and affect. His behavior is normal.    ED Course  Procedures (including critical care time) Labs Review Labs Reviewed  I-STAT CHEM 8, ED - Abnormal; Notable for the following:    Glucose, Bld 108 (*)    Calcium, Ion 1.28 (*)    All other components within normal limits  URINALYSIS, ROUTINE W REFLEX MICROSCOPIC (NOT AT [facility])  CBC WITH DIFFERENTIAL/PLATELET    Imaging Review No results found.   EKG Interpretation None      MDM   Final diagnoses:  Flank pain  Diverticulitis of large intestine without perforation or abscess without bleeding    Patient with diverticulitis by CT. Has benign nonsurgical abdomen without peritoneal irritation on exam. An appropriate candidate for outpatient treatment. Given Flagyl, Cipro here. His Avelox "allergy" is simple GI intolerance. Encouraged to take the Cipro with food. Currently low residue diet. Advance to high residue after his current symptoms resolved. Primary care follow-up. ER with worsening symptoms  including worsening pain, fever, nausea or vomiting or inability to keep down medications at home.    Tanna Furry, MD 07/09/15 [phone]

## 2015-07-16 ENCOUNTER — Ambulatory Visit: Payer: Self-pay | Admitting: Internal Medicine

## 2015-07-18 ENCOUNTER — Encounter: Payer: Self-pay | Admitting: Internal Medicine

## 2015-07-18 DIAGNOSIS — K5732 Diverticulitis of large intestine without perforation or abscess without bleeding: Secondary | ICD-10-CM

## 2015-07-20 ENCOUNTER — Encounter: Payer: Self-pay | Admitting: *Deleted

## 2015-07-22 ENCOUNTER — Ambulatory Visit (INDEPENDENT_AMBULATORY_CARE_PROVIDER_SITE_OTHER): Payer: BLUE CROSS/BLUE SHIELD | Admitting: Internal Medicine

## 2015-07-22 ENCOUNTER — Encounter: Payer: Self-pay | Admitting: Internal Medicine

## 2015-07-22 VITALS — BP 110/70 | HR 72 | Ht 72.0 in | Wt 195.0 lb

## 2015-07-22 DIAGNOSIS — K5732 Diverticulitis of large intestine without perforation or abscess without bleeding: Secondary | ICD-10-CM | POA: Diagnosis not present

## 2015-07-22 MED ORDER — AMOXICILLIN-POT CLAVULANATE 875-125 MG PO TABS: 1.0000 | ORAL_TABLET | Freq: Two times a day (BID) | ORAL | Status: DC

## 2015-07-22 MED ORDER — NA SULFATE-K SULFATE-MG SULF 17.5-3.13-1.6 GM/177ML PO SOLN: ORAL | Status: DC

## 2015-07-22 MED ORDER — HYOSCYAMINE SULFATE 0.125 MG SL SUBL: SUBLINGUAL_TABLET | SUBLINGUAL | Status: DC

## 2015-07-22 NOTE — Patient Instructions (Signed)
You have been scheduled for a colonoscopy. Please follow written instructions given to you at your visit today.  Please pick up your prep supplies at the pharmacy within the next 1-3 days. If you use inhalers (even only as needed), please bring them with you on the day of your procedure. Your physician has requested that you go to www.startemmi.com and enter the access code given to you at your visit today. This web site gives a general overview about your procedure. However, you should still follow specific instructions given to you by our office regarding your preparation for the procedure.  We have sent the following medications to your pharmacy for you to pick up at your convenience: Augmentin 875 mg twice daily x 7 days Levsin-1-2 tablets every 6 hours as needed  Please purchase the following medications over the counter and take as directed: Florastor 250 mg twice daily Miralax-17 grams as needed for soft, easy bowel movements

## 2015-07-22 NOTE — Progress Notes (Signed)
Patient ID: [name], male   DOB: Jan 13, 1981, 34 y.o.   MRN: [id number] HPI: [name] is a 34 year old male with past medical history of recent diverticulitis diagnosed by CT scan, alcohol abuse in remission, anxiety and depression, history of opiate and benzodiazepine dependence who is seen in consultation at the request of Dr. Linna Darner to evaluate recent diverticulitis and ongoing left lower quadrant abdominal pain. He reports he developed left lower quadrant abdominal pain which started in late July 2016. This became more constant and severe and led to an ER visit. CT scan was performed which showed sigmoid diverticulitis. He was given ciprofloxacin and metronidazole. He followed up with Dr. Linna Darner several days later and due to ongoing pain the Cipro was lengthened to 10 days and Flagyl to 500 mg 3 times a day which she took for about 7 days. He has been adherent to a low fiber diet. He is still having some left-sided abdominal discomfort. This is less frequent but described as a pinch or a sharp pain on and off. Prior to antibiotics it was rather constant. He's had no fever or chills. Slightly decreased appetite. He still trying to follow a low fiber diet. Bowel movements have been less frequent on this new diet previously was every morning to twice daily. He denies diarrhea. Denies rectal bleeding or melena. He does take Librium which he is trying to taper off. He also uses Flexeril as a muscle relaxer. He takes several other over-the-counter supplements for anti-inflammatory properties. He avoids NSAIDs due to dyspepsia. He has chronic lower back pain which leads to muscle spasm. He is using vitamin D, fish oil, turmeric, and tart cherry.  Family history notable for colon polyps and paternal grandfather but much later in his life.  Past Medical History  Diagnosis Date  . Tension headache   . Anxiety and depression   . Alcohol abuse   . Skull fracture 2004    Trauma  . Pilonidal cyst   .  Difficulty urinating     cant urinate in public places (restrooms)  . Opioid dependence   . Benzodiazepine dependence   . Diverticulitis     Past Surgical History  Procedure Laterality Date  . Vasectomy  04/2010  . Pilonidal cystectomy  08/01/2011  . Orif right 3rd finger  2012    Dr. Fredna Dow - see hospital notes.  . Radiofrequency ablation nerves  12/10/2012  . Wisdom tooth extraction      Outpatient Prescriptions Prior to Visit  Medication Sig Dispense Refill  . chlordiazePOXIDE (LIBRIUM) 10 MG capsule Take 10 mg by mouth 4 (four) times daily as needed for anxiety. 1-2 tablet 4 times daily.    . cholecalciferol (VITAMIN D) 1000 UNITS tablet Take 1,000 Units by mouth 2 (two) times daily.    Marland Kitchen omega-3 acid ethyl esters (LOVAZA) 1 G capsule Take by mouth 2 (two) times daily.    . TURMERIC PO Take by mouth 2 (two) times daily.    Marland Kitchen HYDROcodone-acetaminophen (NORCO/VICODIN) 5-325 MG per tablet Take 2 tablets by mouth every 4 (four) hours as needed. (Patient not taking: Reported on 07/22/2015) 10 tablet 0  . ondansetron (ZOFRAN ODT) 4 MG disintegrating tablet Take 1 tablet (4 mg total) by mouth every 8 (eight) hours as needed for nausea. (Patient not taking: Reported on 07/22/2015) 20 tablet 0  . acetaminophen-codeine (TYLENOL #3) 300-30 MG per tablet Take 1 tablet by mouth every 6 (six) hours as needed for moderate pain. 20 tablet 0  .  ciprofloxacin (CIPRO) 500 MG tablet Take 1 tablet (500 mg total) by mouth every 12 (twelve) hours. 10 tablet 0  . metroNIDAZOLE (FLAGYL) 500 MG tablet Take 1 tablet (500 mg total) by mouth 2 (two) times daily. 20 tablet 0   No facility-administered medications prior to visit.    Allergies  Allergen Reactions  . Avelox [Moxifloxacin Hcl In Nacl] Nausea And Vomiting  . Fluoxetine     Intolerant of all SSRI's, NE drugs  . Other Nausea And Vomiting    Strong antibiotics by mouth / antidepressants  . Moxifloxacin Nausea And Vomiting    Family History   Problem Relation Age of Onset  . Depression Mother   . Anxiety disorder Mother   . Alcohol abuse Mother   . Hypertension Father   . Hyperlipidemia Father   . Colon polyps Paternal Grandfather   . Cancer Paternal Aunt     breast    Social History  Substance Use Topics  . Smoking status: Former Smoker -- 1.50 packs/day for 7 years    Types: Cigarettes    Quit date: 12/04/2006  . Smokeless tobacco: Never Used     Comment: quit 2 or more years  . Alcohol Use: No    ROS: As per history of present illness, otherwise negative  BP 110/70 mmHg  Pulse 72  Ht 6' (1.829 m)  Wt 195 lb (88.451 kg)  BMI 26.44 kg/m2 Constitutional: Well-developed and well-nourished. No distress. HEENT: Normocephalic and atraumatic. Oropharynx is clear and moist. No oropharyngeal exudate. Conjunctivae are normal.  No scleral icterus. Neck: Neck supple. Trachea midline. Cardiovascular: Normal rate, regular rhythm and intact distal pulses. No M/R/G Pulmonary/chest: Effort normal and breath sounds normal. No wheezing, rales or rhonchi. Abdominal: Soft, mild left lower quad pain without rebound or guarding, nondistended. Bowel sounds active throughout. There are no masses palpable. No hepatosplenomegaly. Extremities: no clubbing, cyanosis, or edema Lymphadenopathy: No cervical adenopathy noted. Neurological: Alert and oriented to person place and time. Skin: Skin is warm and dry. No rashes noted. Psychiatric: Normal mood and affect. Behavior is normal.  RELEVANT LABS AND IMAGING: CBC    Component Value Date/Time   WBC 9.5 07/04/2015 1346   RBC 5.19 07/04/2015 1346   HGB 16.0 07/04/2015 1402   HCT 47.0 07/04/2015 1402   PLT 342 07/04/2015 1346   MCV 85.9 07/04/2015 1346   MCH 28.9 07/04/2015 1346   MCHC 33.6 07/04/2015 1346   RDW 13.9 07/04/2015 1346   LYMPHSABS 3.2 07/04/2015 1346   MONOABS 0.9 07/04/2015 1346   EOSABS 0.3 07/04/2015 1346   BASOSABS 0.0 07/04/2015 1346    CMP     Component  Value Date/Time   NA 141 07/04/2015 1402   K 4.0 07/04/2015 1402   CL 107 07/04/2015 1402   CO2 28 09/07/2014 1355   GLUCOSE 108* 07/04/2015 1402   BUN 14 07/04/2015 1402   CREATININE 1.00 07/04/2015 1402   CALCIUM 9.8 09/07/2014 1355   PROT 8.0 06/21/2014 2208   ALBUMIN 4.2 06/21/2014 2208   AST 26 06/21/2014 2208   ALT 49 06/21/2014 2208   ALKPHOS 81 06/21/2014 2208   BILITOT 0.3 06/21/2014 2208   GFRNONAA >90 06/21/2014 2208   GFRAA >90 06/21/2014 2208      CLINICAL DATA:  Left lower quadrant and left flank pain 4 days. No history of kidney stones.   EXAM: CT ABDOMEN AND PELVIS WITHOUT CONTRAST   TECHNIQUE: Multidetector CT imaging of the abdomen and pelvis was performed following  the standard protocol without IV contrast.   COMPARISON:  Chest CT 04/15/2013   FINDINGS: Lung bases are within normal.   Abdominal images demonstrate a normal liver, spleen, pancreas, gallbladder and adrenal glands. Appendix is normal.   Kidneys are normal in size without hydronephrosis or nephrolithiasis. Ureters are normal.   Vascular structures are within normal.   Mesentery is normal. Small bowel is within normal. There is no free fluid or focal inflammatory change.   There is minimal diverticulosis of the colon. There is a mildly inflamed diverticula over a short segment of the mid descending colon with minimal stranding of the adjacent fat and tiny amount of adjacent fluid compatible with mild acute diverticulitis. No perforation or abscess.   Pelvic images demonstrate a normal bladder, prostate and rectum. A few phleboliths are present. Remaining bones and soft tissues are within normal.   IMPRESSION: Mild acute diverticulitis over short segment of the mid descending colon. No perforation or abscess.     Electronically Signed   By: Marin Olp M.D.   On: 07/04/2015 14:19   ASSESSMENT/PLAN: 34 year old male with past medical history of recent diverticulitis  diagnosed by CT scan, alcohol abuse in remission, anxiety and depression, history of opiate and benzodiazepine dependence who is seen in consultation at the request of Dr. Linna Darner to evaluate recent diverticulitis and ongoing left lower quadrant abdominal pain.   1. LLQ pain/recent diverticulitis -- given pain I am suspicious for mild smoldering diverticulitis. I'm going to retreat with additional antibiotics, Augmentin 875 mg twice a day 7 days. I would like him to begin Florastor to 50 mg twice daily as a probiotic. Levsin 1-2 tablets sublingual every 6 hours as needed for cramping type left-sided abdominal pain. After antibiotics had like him to return to a regular diet. We spent time today discussing how there is no gray data for any particular type diet, specifically avoiding seeds and nuts has not been proven to event recurrent diverticulitis. If stools remain hard or less frequent have recommended MiraLAX 17 g daily to facilitate more regular bowel movements that are soft in character. Once his pain resolves I have recommended colonoscopy for direct visualization and to exclude other processes such as colitis. We discussed the risk benefits and alternatives to colonoscopy and he is agreeable to proceed. If pain worsens I asked that he notify me and if this is the case I would recommend repeat cross-sectional imaging.    KC:[id number] A Kollar, [address] [place], [place] [phone]

## 2015-08-09 ENCOUNTER — Other Ambulatory Visit: Payer: Self-pay | Admitting: Family Medicine

## 2015-08-10 NOTE — Telephone Encounter (Signed)
Refill done.  

## 2015-09-21 ENCOUNTER — Ambulatory Visit (AMBULATORY_SURGERY_CENTER): Payer: BLUE CROSS/BLUE SHIELD | Admitting: Internal Medicine

## 2015-09-21 ENCOUNTER — Encounter: Payer: Self-pay | Admitting: Internal Medicine

## 2015-09-21 VITALS — BP 120/72 | HR 83 | Temp 99.4°F | Resp 15 | Ht 72.0 in | Wt 195.0 lb

## 2015-09-21 DIAGNOSIS — K635 Polyp of colon: Secondary | ICD-10-CM

## 2015-09-21 DIAGNOSIS — D127 Benign neoplasm of rectosigmoid junction: Secondary | ICD-10-CM

## 2015-09-21 DIAGNOSIS — D125 Benign neoplasm of sigmoid colon: Secondary | ICD-10-CM

## 2015-09-21 DIAGNOSIS — D120 Benign neoplasm of cecum: Secondary | ICD-10-CM

## 2015-09-21 DIAGNOSIS — K5732 Diverticulitis of large intestine without perforation or abscess without bleeding: Secondary | ICD-10-CM | POA: Diagnosis present

## 2015-09-21 MED ORDER — SODIUM CHLORIDE 0.9 % IV SOLN: 500.0000 mL | INTRAVENOUS | Status: DC

## 2015-09-21 NOTE — Progress Notes (Signed)
Dental advisory given to patient 

## 2015-09-21 NOTE — Op Note (Signed)
[facility]  [address] [place], [address]   COLONOSCOPY PROCEDURE REPORT  PATIENT: Len, Azeez  MR#: [id number] BIRTHDATE: [birth year] , 34  yrs. old GENDER: male ENDOSCOPIST: Jerene Bears, MD REFERRED OT:LXBWI Hopper, M.D. PROCEDURE DATE:  09/21/2015 PROCEDURE:   Colonoscopy, diagnostic and Colonoscopy with snare polypectomy First Screening Colonoscopy - Avg.  risk and is 50 yrs.  old or older - No.  Prior Negative Screening - Now for repeat screening. N/A  History of Adenoma - Now for follow-up colonoscopy & has been > or = to 3 yrs.  N/A  Polyps removed today? Yes ASA CLASS:   Class II INDICATIONS:follow up for previously diagnosed diverticulitis. MEDICATIONS: Monitored anesthesia care and Propofol 350 mg IV  DESCRIPTION OF PROCEDURE:   After the risks benefits and alternatives of the procedure were thoroughly explained, informed consent was obtained.  The digital rectal exam revealed no rectal mass.   The LB [id number]  endoscope was introduced through the anus and advanced to the terminal ileum which was intubated for a short distance. No adverse events experienced.   The quality of the prep was good.  (Suprep was used)  The instrument was then slowly withdrawn as the colon was fully examined. Estimated blood loss is zero unless otherwise noted in this procedure report.    COLON FINDINGS: Three sessile polyps ranging from 3 to 65mm in size were found at the cecum, in the sigmoid colon, and rectosigmoid colon.  Polypectomies were performed with a cold snare.  The resection was complete, the polyp tissue was completely retrieved and sent to histology.   There was mild diverticulosis noted in the descending colon and sigmoid colon.   The examination was otherwise normal.  Retroflexed views revealed internal hemorrhoids. The time to cecum = 2.2 Withdrawal time = 11.7   The scope was withdrawn and the procedure completed.  COMPLICATIONS: There  were no immediate complications.     ENDOSCOPIC IMPRESSION: 1.   Three sessile polyps ranging from 3 to 84mm in size were found at the cecum, in the sigmoid colon, and rectosigmoid colon; polypectomies were performed with a cold snare 2.   Mild diverticulosis was noted in the descending colon and sigmoid colon 3.   The examination was otherwise normal  RECOMMENDATIONS: 1.  Await pathology results 2.  High fiber diet 3.  Timing of repeat colonoscopy will be determined by pathology findings. 4.  You will receive a letter within 1-2 weeks with the results of your biopsy as well as final recommendations.  Please call my office if you have not received a letter after 3 weeks.  eSigned:  Jerene Bears, MD 09/21/2015 5:43 PM   cc:  the patient, PCP   PATIENT NAME:  Kyley, Laurel MR#: [id number]

## 2015-09-21 NOTE — Progress Notes (Signed)
Called to room to assist during endoscopic procedure.  Patient ID and intended procedure confirmed with present staff. Received instructions for my participation in the procedure from the performing physician.  

## 2015-09-21 NOTE — Patient Instructions (Addendum)
YOU HAD AN ENDOSCOPIC PROCEDURE TODAY AT [facility]:   Refer to the procedure report that was given to you for any specific questions about what was found during the examination.  If the procedure report does not answer your questions, please call your gastroenterologist to clarify.  If you requested that your care partner not be given the details of your procedure findings, then the procedure report has been included in a sealed envelope for you to review at your convenience later.  YOU SHOULD EXPECT: Some feelings of bloating in the abdomen. Passage of more gas than usual.  Walking can help get rid of the air that was put into your GI tract during the procedure and reduce the bloating. If you had a lower endoscopy (such as a colonoscopy or flexible sigmoidoscopy) you may notice spotting of blood in your stool or on the toilet paper. If you underwent a bowel prep for your procedure, you may not have a normal bowel movement for a few days.  Please Note:  You might notice some irritation and congestion in your nose or some drainage.  This is from the oxygen used during your procedure.  There is no need for concern and it should clear up in a day or so.  SYMPTOMS TO REPORT IMMEDIATELY:   Following lower endoscopy (colonoscopy or flexible sigmoidoscopy):  Excessive amounts of blood in the stool  Significant tenderness or worsening of abdominal pains  Swelling of the abdomen that is new, acute  Fever of 100F or higher  For urgent or emergent issues, a gastroenterologist can be reached at any hour by calling [phone].   DIET: Your first meal following the procedure should be a small meal and then it is ok to progress to your normal diet. Heavy or fried foods are harder to digest and may make you feel nauseous or bloated.  Likewise, meals heavy in dairy and vegetables can increase bloating.  Drink plenty of fluids but you should avoid alcoholic beverages for 24  hours.  ACTIVITY:  You should plan to take it easy for the rest of today and you should NOT DRIVE or use heavy machinery until tomorrow (because of the sedation medicines used during the test).    FOLLOW UP: Our staff will call the number listed on your records the next business day following your procedure to check on you and address any questions or concerns that you may have regarding the information given to you following your procedure. If we do not reach you, we will leave a message.  However, if you are feeling well and you are not experiencing any problems, there is no need to return our call.  We will assume that you have returned to your regular daily activities without incident.  If any biopsies were taken you will be contacted by phone or by letter within the next 1-3 weeks.  Please call us at [phone] if you have not heard about the biopsies in 3 weeks.    SIGNATURES/CONFIDENTIALITY: You and/or your care partner have signed paperwork which will be entered into your electronic medical record.  These signatures attest to the fact that that the information above on your After Visit Summary has been reviewed and is understood.  Full responsibility of the confidentiality of this discharge information lies with you and/or your care-partner.   Await pathology results. Please review polyp, diverticulosis, and high fiber diet handouts provided.

## 2015-09-21 NOTE — Progress Notes (Signed)
To recovery, report to Myers, RN, VSS. 

## 2015-09-22 ENCOUNTER — Telehealth: Payer: Self-pay | Admitting: *Deleted

## 2015-09-22 NOTE — Telephone Encounter (Signed)
Message left

## 2015-09-28 ENCOUNTER — Encounter: Payer: Self-pay | Admitting: Internal Medicine

## 2015-10-04 ENCOUNTER — Other Ambulatory Visit: Payer: Self-pay | Admitting: Family Medicine

## 2015-10-05 ENCOUNTER — Ambulatory Visit (INDEPENDENT_AMBULATORY_CARE_PROVIDER_SITE_OTHER): Payer: BLUE CROSS/BLUE SHIELD

## 2015-10-05 DIAGNOSIS — Z23 Encounter for immunization: Secondary | ICD-10-CM

## 2015-10-05 NOTE — Telephone Encounter (Signed)
Refill done.  

## 2015-11-17 ENCOUNTER — Other Ambulatory Visit: Payer: Self-pay | Admitting: Family Medicine

## 2015-11-17 NOTE — Telephone Encounter (Signed)
Refill done.  

## 2016-01-17 ENCOUNTER — Telehealth: Payer: Self-pay | Admitting: Internal Medicine

## 2016-01-17 MED ORDER — CYCLOBENZAPRINE HCL 5 MG PO TABS: ORAL_TABLET | ORAL | Status: DC

## 2016-01-17 NOTE — Telephone Encounter (Signed)
Left msg on triage not able to send request electronically but pt is wanting refill on his Cyclobenzaprine. Last filled 11/17/15 rx by Dr. Hulan Saas...[name]

## 2016-01-17 NOTE — Telephone Encounter (Signed)
Wife called in said that pt needs refill for cyclobenzaprine (FLEXERIL) 5 MG tablet  [facility] [address]

## 2016-01-17 NOTE — Telephone Encounter (Signed)
Refill done.  Pt needs appt for future refills.  

## 2016-02-21 ENCOUNTER — Other Ambulatory Visit: Payer: Self-pay | Admitting: Family Medicine

## 2016-02-22 NOTE — Telephone Encounter (Signed)
Refill done.  

## 2016-03-22 ENCOUNTER — Other Ambulatory Visit: Payer: Self-pay | Admitting: Family Medicine

## 2016-03-23 NOTE — Telephone Encounter (Signed)
Pt has not been seen since 05/2015. Per dr Tamala Julian. Refill denied. Pt needs an appt

## 2016-04-19 ENCOUNTER — Other Ambulatory Visit: Payer: Self-pay | Admitting: Family Medicine

## 2016-04-20 NOTE — Telephone Encounter (Signed)
Per Dr. Tamala Julian, pt need an appt.  Refill denied.

## 2016-04-25 ENCOUNTER — Ambulatory Visit (INDEPENDENT_AMBULATORY_CARE_PROVIDER_SITE_OTHER): Payer: BLUE CROSS/BLUE SHIELD | Admitting: Family Medicine

## 2016-04-25 ENCOUNTER — Encounter: Payer: Self-pay | Admitting: Family Medicine

## 2016-04-25 VITALS — BP 112/70 | HR 109 | Ht 72.0 in | Wt 196.0 lb

## 2016-04-25 DIAGNOSIS — M4317 Spondylolisthesis, lumbosacral region: Secondary | ICD-10-CM

## 2016-04-25 MED ORDER — CYCLOBENZAPRINE HCL 5 MG PO TABS: ORAL_TABLET | ORAL | Status: DC

## 2016-04-25 NOTE — Assessment & Plan Note (Signed)
Extremely impressive patient at this time. Patient continues to need the muscle relaxer and I'm fine with refilling this on a annual basis. We discussed that we will continue to monitor. Do not feel that any further imaging is necessary unless any radicular symptoms occur. Encourage him to continue to work on ergonomics as well as postural exercises and was given some new ones. Refilled the muscle relaxer. Follow-up on an as-needed basis.  Spent  25 minutes with patient face-to-face and had greater than 50% of counseling including as described above in assessment and plan.

## 2016-04-25 NOTE — Patient Instructions (Signed)
Good to see you Refilled flexeril Add in Y-T-A exercises daily, should only take 1 minutes Pectinus muscle spasm in hip, likely keep doing what you are dong but try some squats and glute exercises See me when you need me!

## 2016-04-25 NOTE — Progress Notes (Signed)
  [name] [place], [place] [address] Phone: [phone] Subjective:     CC: Back pain follow up  [id number] [name] is a 35 y.o. male coming in with complaint of back pain. Patient was seen previously for and pars defect at L5 as well as a 4 mm grade 1 anteriorlithesis L5 on S1.   Patient is been doing remarkably well with conservative therapy. Patient has been going to the gym 2-3 times a week. Not having any significant discomfort overall. Some mild soreness from time to time in the mid back. Some mild pain in the left hip but nothing that stops him from activity. Never even on a daily activity and states that the frequency is very intermittent. Patient has had no radiation to any of the Achilles. Continues to take the muscle relaxer one pill in the morning and 2 pills at night. Needs a refill and has been a year since patient has been seen. Able to increase his activity and is doing many more things around the house. Has put on more muscle and states that that has felt significantly better.  Past medical history, social, surgical and family history all reviewed in electronic medical record. Patient does have a past medical history significant for benzodiazepine dependence, opiate dependence with withdrawal, anxiety and depression.  Review of Systems: No headache, visual changes, nausea, vomiting, diarrhea, constipation, dizziness, abdominal pain, skin rash, fevers, chills, night sweats, weight loss, swollen lymph nodes, body aches, joint swelling, muscle aches, chest pain, shortness of breath, mood changes.   Objective Blood pressure 112/70, pulse 109, height 6' (1.829 m), weight 196 lb (88.905 kg), SpO2 97 %.  General: No apparent distress alert and oriented x3 mood and affect normal, dressed appropriately.  HEENT: Pupils equal, extraocular movements intact  Respiratory: Patient's speak in full sentences and does not appear short of  breath  Cardiovascular: No lower extremity edema, non tender, no erythema  Skin: Warm dry intact with no signs of infection or rash on extremities or on axial skeleton.  Abdomen: Soft nontender  Neuro: Cranial nerves II through XII are intact, neurovascularly intact in all extremities with 2+ DTRs and 2+ pulses.  Lymph: No lymphadenopathy of posterior or anterior cervical chain or axillae bilaterally.  Gait normal with good balance and coordination.  MSK:  Non tender with full range of motion and good stability and symmetric strength and tone of shoulders, elbows, wrist, hip, knee and ankles bilaterally. Mild hypermobility syndrome. Or muscles mass than previous exam Back Exam:  Inspection: Continued increase in muscle mass specially upper body previous exam Motion: Flexion 35 deg, Extension 35 deg, Side Bending to 35 deg bilaterally,  Rotation to 30 deg bilaterally  SLR laying: Negative  XSLR laying: Negative  Palpable tenderness: Nontender on exam   Reflexes: 2+ at both patellar tendons, 2+ at achilles tendons, Babinski's downgoing.  Strength at foot  Plantar-flexion: 5/5 Dorsi-flexion: 5/5 Eversion: 5/5 Inversion: 5/5  Leg strength  Quad: 5/5 Hamstring: 5/5 Hip flexor: 4/5 Hip abductors: 5/5      Impression and Recommendations:     This case required medical decision making of moderate complexity.

## 2016-04-25 NOTE — Progress Notes (Signed)
Pre visit review using our clinic review tool, if applicable. No additional management support is needed unless otherwise documented below in the visit note. 

## 2016-07-12 NOTE — Progress Notes (Deleted)
  [name] [place], [place] [address] Phone: [phone] Subjective:     CC: Back pain follow up  [id number]  [name] is a 35 y.o. male coming in with complaint of back pain. Patient was seen previously for and pars defect at L5 as well as a 4 mm grade 1 anteriorlithesis L5 on S1.   Patient is been doing remarkably well with conservative therapy. Patient has been going to the gym 2-3 times a week. Not having any significant discomfort overall. Some mild soreness from time to time in the mid back. Some mild pain in the left hip but nothing that stops him from activity. Never even on a daily activity and states that the frequency is very intermittent. Patient has had no radiation to any of the Achilles. Continues to take the muscle relaxer one pill in the morning and 2 pills at night. Needs a refill and has been a year since patient has been seen. Able to increase his activity and is doing many more things around the house. Has put on more muscle and states that that has felt significantly better.  Past medical history, social, surgical and family history all reviewed in electronic medical record. Patient does have a past medical history significant for benzodiazepine dependence, opiate dependence with withdrawal, anxiety and depression.  Review of Systems: No headache, visual changes, nausea, vomiting, diarrhea, constipation, dizziness, abdominal pain, skin rash, fevers, chills, night sweats, weight loss, swollen lymph nodes, body aches, joint swelling, muscle aches, chest pain, shortness of breath, mood changes.   Objective  There were no vitals taken for this visit.  General: No apparent distress alert and oriented x3 mood and affect normal, dressed appropriately.  HEENT: Pupils equal, extraocular movements intact  Respiratory: Patient's speak in full sentences and does not appear short of breath  Cardiovascular: No lower extremity edema,  non tender, no erythema  Skin: Warm dry intact with no signs of infection or rash on extremities or on axial skeleton.  Abdomen: Soft nontender  Neuro: Cranial nerves II through XII are intact, neurovascularly intact in all extremities with 2+ DTRs and 2+ pulses.  Lymph: No lymphadenopathy of posterior or anterior cervical chain or axillae bilaterally.  Gait normal with good balance and coordination.  MSK:  Non tender with full range of motion and good stability and symmetric strength and tone of shoulders, elbows, wrist, hip, knee and ankles bilaterally. Mild hypermobility syndrome. Or muscles mass than previous exam Back Exam:  Inspection: Continued increase in muscle mass specially upper body previous exam Motion: Flexion 35 deg, Extension 35 deg, Side Bending to 35 deg bilaterally,  Rotation to 30 deg bilaterally  SLR laying: Negative  XSLR laying: Negative  Palpable tenderness: Nontender on exam   Reflexes: 2+ at both patellar tendons, 2+ at achilles tendons, Babinski's downgoing.  Strength at foot  Plantar-flexion: 5/5 Dorsi-flexion: 5/5 Eversion: 5/5 Inversion: 5/5  Leg strength  Quad: 5/5 Hamstring: 5/5 Hip flexor: 4/5 Hip abductors: 5/5      Impression and Recommendations:     This case required medical decision making of moderate complexity.

## 2016-07-13 ENCOUNTER — Ambulatory Visit: Payer: BLUE CROSS/BLUE SHIELD | Admitting: Family Medicine

## 2016-07-24 NOTE — Progress Notes (Signed)
  [name] [place],  [address] Phone: [phone] Subjective:     CC: Back pain follow up  [id number]  [name] is a 34 y.o. male coming in with complaint of back pain. Patient was seen previously for and pars defect at L5 as well as a 4 mm grade 1 anteriorlithesis L5 on S1.   Patient is been doing remarkably well with conservative therapy. Patient has been going to the gym 2-3 times a week. Usually goes for about 2-3 hours. Overall has been feeling very good.  Patient is complaining of some left knee pain. States that it only happens after sitting for long amount of time. Sharp pain at last seconds and goes away. Patient is once make sure that he is not doing any worsening symptoms.  Past medical history, social, surgical and family history all reviewed in electronic medical record. Patient does have a past medical history significant for benzodiazepine dependence, opiate dependence with withdrawal, anxiety and depression.  Review of Systems: No headache, visual changes, nausea, vomiting, diarrhea, constipation, dizziness, abdominal pain, skin rash, fevers, chills, night sweats, weight loss, swollen lymph nodes, body aches, joint swelling, muscle aches, chest pain, shortness of breath, mood changes.   Objective  Blood pressure 124/72, pulse 86, weight 192 lb (87.1 kg), SpO2 97 %.  General: No apparent distress alert and oriented x3 mood and affect normal, dressed appropriately.  HEENT: Pupils equal, extraocular movements intact  Respiratory: Patient's speak in full sentences and does not appear short of breath  Cardiovascular: No lower extremity edema, non tender, no erythema  Skin: Warm dry intact with no signs of infection or rash on extremities or on axial skeleton.  Abdomen: Soft nontender  Neuro: Cranial nerves II through XII are intact, neurovascularly intact in all extremities with 2+ DTRs and 2+ pulses.  Lymph: No  lymphadenopathy of posterior or anterior cervical chain or axillae bilaterally.  Gait normal with good balance and coordination.  MSK:  Non tender with full range of motion and good stability and symmetric strength and tone of shoulders, elbows, wrist, hip, and ankles bilaterally. Mild hypermobility syndrome. Or muscles mass than previous exam Back Exam:  Inspection: Nothing remarkable Motion: Flexion 35 deg, Extension 35 deg, Side Bending to 35 deg bilaterally,  Rotation to 30 deg bilaterally  SLR laying: Negative  XSLR laying: Negative  Palpable tenderness: Nontender on exam   Reflexes: 2+ at both patellar tendons, 2+ at achilles tendons, Babinski's downgoing.  Strength at foot  Plantar-flexion: 5/5 Dorsi-flexion: 5/5 Eversion: 5/5 Inversion: 5/5  Leg strength  Quad: 5/5 Hamstring: 5/5 Hip flexor: 4/5 Hip abductors: 5/5  Knee: Left Patient does have what appears to be a medial plica that his minorly tender to palpation. Patient patella moves without any grinding. Negative Thessaly's. Full range of motion. Full strength. Contralateral knee unremarkable    Impression and Recommendations:     This case required medical decision making of moderate complexity.

## 2016-07-25 ENCOUNTER — Ambulatory Visit (INDEPENDENT_AMBULATORY_CARE_PROVIDER_SITE_OTHER): Payer: BLUE CROSS/BLUE SHIELD | Admitting: Family Medicine

## 2016-07-25 ENCOUNTER — Encounter: Payer: Self-pay | Admitting: Family Medicine

## 2016-07-25 DIAGNOSIS — M6752 Plica syndrome, left knee: Secondary | ICD-10-CM | POA: Insufficient documentation

## 2016-07-25 DIAGNOSIS — M4317 Spondylolisthesis, lumbosacral region: Secondary | ICD-10-CM

## 2016-07-25 NOTE — Assessment & Plan Note (Signed)
Patient has been doing relatively well. Encourage him to continue the course strengthening. Patient has been doing very well and I would not like to make any significant changes. Patient has complained of some mild weakness. We did discuss some over-the-counter medications a could be beneficial as well. Patient will follow-up with me on an as-needed basis.

## 2016-07-25 NOTE — Patient Instructions (Signed)
Great to see you  DHEA 50 mg daily for 4 weeks then 2 weeks off and repeat as needed With squats externally rotate feet 10 degrees and feet a little more then shoulder width apart See me when you need me Proud of you!~    Plica Syndrome With Rehab Plicae exist in approximately 60% of adults. They are folds in the joint lining (synovial tissue) that are remnant from development of the joint. These folds occur most commonly in the knee. Most individuals do not experience any adverse symptoms due to the presence of a plica. If a plica becomes thickened or inflamed, then it may cause symptoms. SYMPTOMS   Pain in the knee joint, usually greatest in the front (anterior) portion.  Pain that worsens with kneeling, squatting, or walking up/down stairs.  Catching, locking, and/or clicking of the knee. CAUSES  You are born with them (congenital). Plica syndrome is caused by irritation to the plicae. Irritation may be from injury or overuse in sports.  RISK INCREASES WITH:   Activities that include repetitive stress placed on the knee (kicking or jumping).  Previous knee injury.  Activities in which trauma to the knee is likely (volleyball, soccer, or football). PREVENTION  Use properly fitted and padded protective equipment.  Warm up and stretch properly before activity.  Allow for adequate recovery between workouts.  Maintain physical fitness:  Strength, flexibility, and endurance.  Cardiovascular fitness. PROGNOSIS  If treated properly, then the symptoms of plica syndrome usually resolve. RELATED COMPLICATIONS   Recurrent symptoms that result in a chronic problem.  Inability to compete in athletics.  Prolonged healing time, if improperly treated.  Risks of surgery: infection, bleeding, nerve damage, or damage to surrounding tissues. TREATMENT Treatment initially involves the use of ice and medication to help reduce pain and inflammation. The use of strengthening and  stretching exercises may help reduce pain with activity, specifically exercises involving the hamstring and quadriceps muscles. These exercises may be performed at home or with referral to a therapist. Your caregiver may recommend a corticosteroid injection to help reduce inflammation. For individuals with flat feet, an arch support may be recommended. If pain persists for greater than 6 months of nonsurgical (conservative) treatment, then surgery may be recommended to remove the plica.  MEDICATION   If pain medication is necessary, then nonsteroidal anti-inflammatory medications, such as aspirin and ibuprofen, or other minor pain relievers, such as acetaminophen, are often recommended.  Do not take pain medication for 7 days before surgery.  Prescription pain relievers may be given if deemed necessary by your caregiver. Use only as directed and only as much as you need.  Corticosteroid injections may be given by your caregiver. These injections should be reserved for the most serious cases because they may only be given a certain number of times. HEAT AND COLD  Cold treatment (icing) relieves pain and reduces inflammation. Cold treatment should be applied for 10 to 15 minutes every 2 to 3 hours for inflammation and pain and immediately after any activity that aggravates your symptoms. Use ice packs or massage the area with a piece of ice (ice massage).  Heat treatment may be used prior to performing the stretching and strengthening activities prescribed by your caregiver, physical therapist, or athletic trainer. Use a heat pack or soak the injury in warm water. SEEK IMMEDIATE MEDICAL CARE IF:  Treatment seems to offer no benefit, or the condition worsens.  Any medications produce adverse side effects. EXERCISES RANGE OF MOTION (ROM) AND STRETCHING  EXERCISES - Plica Syndrome These exercises may help you when beginning to rehabilitate your injury. Your symptoms may resolve with or without  further involvement from your physician, physical therapist or athletic trainer. While completing these exercises, remember:   Restoring tissue flexibility helps normal motion to return to the joints. This allows healthier, less painful movement and activity.  An effective stretch should be held for at least 30 seconds.  A stretch should never be painful. You should only feel a gentle lengthening or release in the stretched tissue. STRETCH - Quadriceps, Prone  Lie on your stomach on a firm surface, such as a bed or padded floor.  Bend your right / left knee and grasp your ankle. If you are unable to reach, your ankle or pant leg, use a belt around your foot to lengthen your reach.  Gently pull your heel toward your buttocks. Your knee should not slide out to the side. You should feel a stretch in the front of your thigh and/or knee.  Hold this position for __________ seconds. Repeat __________ times. Complete this stretch __________ times per day.  STRETCH - Hamstrings, Supine   Lie on your back. Loop a belt or towel over the ball of your right / left foot.  Straighten your right / left knee and slowly pull on the belt to raise your leg. Do not allow the right / left knee to bend. Keep your opposite leg flat on the floor.  Raise the leg until you feel a gentle stretch behind your right / left knee or thigh. Hold this position for __________ seconds. Repeat __________ times. Complete this stretch __________ times per day.  STRETCH - Hamstrings, Doorway  Lie on your back with your right / left leg extended and resting on the wall and the opposite leg flat on the ground through the door. Initially, position your bottom farther away from the wall.  Keep your right / left knee straight. If you feel a stretch behind your knee or thigh, hold this position for __________ seconds.  If you do not feel a stretch, scoot your bottom closer to the door and hold __________ seconds. Repeat __________  times. Complete this stretch __________ times per day.  STRETCH - Iliotibial Band  On the floor or bed, lie on your side so your right / left leg is on top. Bend your knee and grab your ankle.  Slowly bring your knee back so that your thigh is in line with your trunk. Keep your heel at your buttocks and gently arch your back so your head, shoulders and hips line up.  Slowly lower your leg so that your knee approaches the floor/bed until you feel a gentle stretch on the outside of your right / left thigh. If you do not feel a stretch and your knee will not fall farther, place the heel of your opposite foot on top of your knee and pull your thigh down farther.  Hold this stretch for __________ seconds. Repeat __________ times. Complete this stretch __________ times per day. STRETCH - Hamstrings, Standing  Stand or sit and extend your right / left leg, placing your foot on a chair or foot stool  Keeping a slight arch in your low back and your hips straight forward.  Lead with your chest and lean forward at the waist until you feel a gentle stretch in the back of your right / left knee or thigh. (When done correctly, this exercise requires leaning only a small distance.)  Hold this position for __________ seconds. Repeat __________ times. Complete this stretch __________ times per day. STRENGTHENING EXERCISES - Plica Syndrome  These exercises may help you when beginning to rehabilitate your injury. They may resolve your symptoms with or without further involvement from your physician, physical therapist or athletic trainer. While completing these exercises, remember:   Muscles can gain both the endurance and the strength needed for everyday activities through controlled exercises.  Complete these exercises as instructed by your physician, physical therapist or athletic trainer. Progress the resistance and repetitions only as guided. STRENGTH - Quadriceps, Isometrics  Lie on your back with  your right / left leg extended and your opposite knee bent.  Gradually tense the muscles in the front of your right / left thigh. You should see either your knee cap slide up toward your hip or increased dimpling just above the knee. This motion will push the back of the knee down toward the floor/mat/bed on which you are lying.  Hold the muscle as tight as you can without increasing your pain for __________ seconds.  Relax the muscles slowly and completely in between each repetition. Repeat __________ times. Complete this exercise __________ times per day.  STRENGTH - Quadriceps, Short Arcs   Lie on your back. Place a __________ inch towel roll under your knee so that the knee slightly bends.  Raise only your lower leg by tightening the muscles in the front of your thigh. Do not allow your thigh to rise.  Hold this position for __________ seconds. Repeat __________ times. Complete this exercise __________ times per day.  OPTIONAL ANKLE WEIGHTS: Begin with ____________________, but DO NOT exceed ____________________. Increase in 1 lb/0.5 kg increments. STRENGTH - Quadriceps, Straight Leg Raises  Quality counts! Watch for signs that the quadriceps muscle is working to insure you are strengthening the correct muscles and not "cheating" by substituting with healthier muscles.  Lay on your back with your right / left leg extended and your opposite knee bent.  Tense the muscles in the front of your right / left thigh. You should see either your knee cap slide up or increased dimpling just above the knee. Your thigh may even quiver.  Tighten these muscles even more and raise your leg 4 to 6 inches off the floor. Hold for __________ seconds.  Keeping these muscles tense, lower your leg.  Relax the muscles slowly and completely in between each repetition. Repeat __________ times. Complete this exercise __________ times per day.  STRENGTH - Quadriceps, Step-Ups   Use a thick book, step or step  stool that is __________ inches tall.  Holding a wall or counter for balance only, not support.  Slowly step-up with your right / left foot, keeping your knee in line with your hip and foot. Do not allow your knee to bend so far that you cannot see your toes.  Slowly unlock your knee and lower yourself to the starting position. Your muscles, not gravity, should lower you. Repeat __________ times. Complete this exercise __________ times per day.  STRENGTH - Quad/VMO, Isometric   Sit in a chair with your right / left knee slightly bent. With your fingertips, feel the VMO muscle just above the inside of your knee. The VMO is important in controlling the position of your kneecap.  Keeping your fingertips on this muscle. Without actually moving your leg, attempt to drive your knee down as if straightening your leg. You should feel your VMO tense. If you have a difficult time,  you may wish to try the same exercise on your healthy knee first.  Tense this muscle as hard as you can without increasing any knee pain.  Hold for __________ seconds. Relax the muscles slowly and completely in between each repetition. Repeat __________ times. Complete exercise __________ times per day.    This information is not intended to replace advice given to you by your health care provider. Make sure you discuss any questions you have with your health care provider.   Document Released: 11/20/2005 Document Revised: 04/06/2015 Document Reviewed: 03/04/2009 Elsevier Interactive Patient Education [employer].

## 2016-07-25 NOTE — Assessment & Plan Note (Signed)
Patient is more of a plica syndrome. Given home exercises, discussed bracing, discussed proper lifting techniques. Follow-up in 4 weeks if not improved.

## 2016-09-07 ENCOUNTER — Encounter: Payer: Self-pay | Admitting: Internal Medicine

## 2016-09-07 ENCOUNTER — Ambulatory Visit (INDEPENDENT_AMBULATORY_CARE_PROVIDER_SITE_OTHER)
Admission: RE | Admit: 2016-09-07 | Discharge: 2016-09-07 | Disposition: A | Discharge status: Home / Self Care | Payer: BLUE CROSS/BLUE SHIELD | Source: Ambulatory Visit | Attending: Internal Medicine | Admitting: Internal Medicine

## 2016-09-07 ENCOUNTER — Ambulatory Visit (INDEPENDENT_AMBULATORY_CARE_PROVIDER_SITE_OTHER): Payer: BLUE CROSS/BLUE SHIELD | Admitting: Internal Medicine

## 2016-09-07 VITALS — BP 164/98 | HR 111 | Temp 98.3°F | Resp 18 | Ht 73.0 in | Wt 200.4 lb

## 2016-09-07 DIAGNOSIS — M545 Low back pain, unspecified: Secondary | ICD-10-CM

## 2016-09-07 DIAGNOSIS — F449 Dissociative and conversion disorder, unspecified: Secondary | ICD-10-CM | POA: Insufficient documentation

## 2016-09-07 DIAGNOSIS — Z23 Encounter for immunization: Secondary | ICD-10-CM | POA: Diagnosis not present

## 2016-09-07 NOTE — Assessment & Plan Note (Signed)
X-ray for recent fall. He will continue to use OTC medication only. No red flag symptoms to suggest need for MRI.

## 2016-09-07 NOTE — Progress Notes (Signed)
Pre visit review using our clinic review tool, if applicable. No additional management support is needed unless otherwise documented below in the visit note. 

## 2016-09-07 NOTE — Progress Notes (Signed)
   Subjective:    Patient ID: [name], male    DOB: 02-15-1981, 35 y.o.   MRN: [id number]  HPI The patient is a 35 YO man coming in for fall and injury to his tailbone. He is not able to describe the fall or injury but landed on his tailbone. He denies LOC or head injury. He is having some pain and soreness but no change to his activities. He is using his pain medication otc for pain with good relief.  His other concern is some dissociative episodes. He is wanting to see a neurologist. He does have a psychiatrist [facility] Care) and is taking librium (getting 240 per month per [place] narcotic database last filled 9/28) and he states that he is trying to wean some down from them. He has never been able to take SSRIs in the past due to side effects and no relief. He is stating that he is having some episodes where all thoughts go from his head and he is dissociating and his wife is concerned. She is not present today. They last minutes (he is not sure how long).   Review of Systems  Constitutional: Positive for activity change. Negative for appetite change, fatigue, fever and unexpected weight change.  Respiratory: Negative.   Cardiovascular: Negative.   Gastrointestinal: Negative.   Musculoskeletal: Positive for arthralgias and back pain. Negative for gait problem, joint swelling and myalgias.  Skin: Negative.   Neurological: Negative for dizziness, syncope, light-headedness and headaches.       Dissociative episodes  Psychiatric/Behavioral:       Some feeling of grandeur      Objective:   Physical Exam  Constitutional: He is oriented to person, place, and time. He appears well-developed and well-nourished.  HENT:  Head: Normocephalic and atraumatic.  Cardiovascular: Normal rate and regular rhythm.   Pulmonary/Chest: Effort normal. No respiratory distress.  Abdominal: Soft.  Musculoskeletal: He exhibits tenderness.  Tenderness over the coccyx without radiation  Neurological: He is alert and  oriented to person, place, and time. Coordination normal.  Skin: Skin is warm and dry.  Psychiatric:  Some scattered thoughts during the visit, appearance casual   Vitals:   09/07/16 0926  BP: (!) 160/100  Pulse: (!) 111  Resp: 18  Temp: 98.3 F (36.8 C)  TempSrc: Oral  SpO2: 97%  Weight: 200 lb 6.4 oz (90.9 kg)  Height: 6\' 1"  (1.854 m)      Assessment & Plan:  Flu shot given at visit.

## 2016-09-07 NOTE — Patient Instructions (Signed)
We have put in the referral to neurology for that to be done quickly.   We are checking the x-ray today and will call you back with the results.

## 2016-09-07 NOTE — Assessment & Plan Note (Signed)
It is possible that this is related to his mental health however he has a strong preference to see a neurologist to discuss these episodes. I did encourage him to bring his wife to this visit so that she can help with history. I have also asked him to talk to Dr. Toy Care about this. The amount of libruim he is on could certainly cause these dissociative symptoms.

## 2016-09-09 ENCOUNTER — Emergency Department (HOSPITAL_COMMUNITY): Payer: BLUE CROSS/BLUE SHIELD

## 2016-09-09 ENCOUNTER — Emergency Department (HOSPITAL_COMMUNITY)
Admission: EM | Admit: 2016-09-09 | Discharge: 2016-09-09 | Disposition: A | Discharge status: Home / Self Care | Payer: BLUE CROSS/BLUE SHIELD | Attending: Physician Assistant | Admitting: Physician Assistant

## 2016-09-09 ENCOUNTER — Encounter (HOSPITAL_COMMUNITY): Payer: Self-pay

## 2016-09-09 DIAGNOSIS — Y92410 Unspecified street and highway as the place of occurrence of the external cause: Secondary | ICD-10-CM | POA: Insufficient documentation

## 2016-09-09 DIAGNOSIS — K5792 Diverticulitis of intestine, part unspecified, without perforation or abscess without bleeding: Secondary | ICD-10-CM | POA: Insufficient documentation

## 2016-09-09 DIAGNOSIS — S2232XA Fracture of one rib, left side, initial encounter for closed fracture: Secondary | ICD-10-CM | POA: Diagnosis not present

## 2016-09-09 DIAGNOSIS — Y939 Activity, unspecified: Secondary | ICD-10-CM | POA: Insufficient documentation

## 2016-09-09 DIAGNOSIS — M545 Low back pain: Secondary | ICD-10-CM | POA: Diagnosis present

## 2016-09-09 DIAGNOSIS — Y999 Unspecified external cause status: Secondary | ICD-10-CM | POA: Diagnosis not present

## 2016-09-09 DIAGNOSIS — Z87891 Personal history of nicotine dependence: Secondary | ICD-10-CM | POA: Diagnosis not present

## 2016-09-09 LAB — COMPREHENSIVE METABOLIC PANEL
ALBUMIN: 4.1 g/dL (ref 3.5–5.0)
ALT: 50 U/L (ref 17–63)
AST: 36 U/L (ref 15–41)
Alkaline Phosphatase: 74 U/L (ref 38–126)
Anion gap: 8 (ref 5–15)
BILIRUBIN TOTAL: 0.4 mg/dL (ref 0.3–1.2)
BUN: 16 mg/dL (ref 6–20)
CO2: 25 mmol/L (ref 22–32)
Calcium: 9.7 mg/dL (ref 8.9–10.3)
Chloride: 104 mmol/L (ref 101–111)
Creatinine, Ser: 1.05 mg/dL (ref 0.61–1.24)
GFR calc Af Amer: >60 mL/min (ref >60)
GFR calc non Af Amer: >60 mL/min (ref >60)
GLUCOSE: 114 mg/dL — AB (ref 65–99)
POTASSIUM: 4.3 mmol/L (ref 3.5–5.1)
Sodium: 137 mmol/L (ref 135–145)
TOTAL PROTEIN: 7.5 g/dL (ref 6.5–8.1)

## 2016-09-09 LAB — CBC WITH DIFFERENTIAL/PLATELET
BASOS ABS: 0 10*3/uL (ref 0.0–0.1)
Basophils Relative: 0 %
EOS PCT: 8 %
Eosinophils Absolute: 0.8 10*3/uL — ABNORMAL HIGH (ref 0.0–0.7)
HCT: 42.4 % (ref 39.0–52.0)
Hemoglobin: 14.9 g/dL (ref 13.0–17.0)
LYMPHS ABS: 2.9 10*3/uL (ref 0.7–4.0)
LYMPHS PCT: 30 %
MCH: 30.4 pg (ref 26.0–34.0)
MCHC: 35.1 g/dL (ref 30.0–36.0)
MCV: 86.5 fL (ref 78.0–100.0)
MONO ABS: 1.3 10*3/uL — AB (ref 0.1–1.0)
Monocytes Relative: 14 %
Neutro Abs: 4.6 10*3/uL (ref 1.7–7.7)
Neutrophils Relative %: 48 %
PLATELETS: 326 10*3/uL (ref 150–400)
RBC: 4.9 MIL/uL (ref 4.22–5.81)
RDW: 14.5 % (ref 11.5–15.5)
WBC: 9.6 10*3/uL (ref 4.0–10.5)

## 2016-09-09 MED ORDER — IOPAMIDOL (ISOVUE-370) INJECTION 76%
100.0000 mL | Freq: Once | INTRAVENOUS | Status: AC | PRN
  Administered 2016-09-09: 100 mL via INTRAVENOUS

## 2016-09-09 MED ORDER — OXYCODONE-ACETAMINOPHEN 5-325 MG PO TABS: 1.0000 | ORAL_TABLET | Freq: Four times a day (QID) | ORAL | 0 refills | Status: DC | PRN

## 2016-09-09 MED ORDER — OXYCODONE-ACETAMINOPHEN 5-325 MG PO TABS
1.0000 | ORAL_TABLET | Freq: Once | ORAL | Status: AC
  Administered 2016-09-09: 1 via ORAL
  Filled 2016-09-09: qty 1

## 2016-09-09 MED ORDER — IBUPROFEN 800 MG PO TABS: 800.0000 mg | ORAL_TABLET | Freq: Three times a day (TID) | ORAL | 0 refills | Status: DC
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
## 2016-09-09 NOTE — ED Notes (Signed)
Pt reports that he has been treating the back pain with ice, heat, tylenol, ibuprofen and prescription Flexeril but "nothing is helping"

## 2016-09-09 NOTE — ED Notes (Signed)
Pt states that he fell last on Sunday, c/o left side lower back pain, 9/10 on 0 to 10 scale, describes the pain as spasms and cramping, denies numbness and tingling, has full ROM of all extremities, currently ambulates with cane assistance.
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
## 2016-09-09 NOTE — ED Notes (Signed)
RT called for IS.

## 2016-09-09 NOTE — ED Triage Notes (Signed)
He c/o flare of left-sided low back pain yesterday. He states he was already actively engaged in physical therapy for back pain secondary to "a couple of falls". He is in no distress and ambulates with a cane.

## 2016-09-13 ENCOUNTER — Ambulatory Visit (INDEPENDENT_AMBULATORY_CARE_PROVIDER_SITE_OTHER): Payer: BLUE CROSS/BLUE SHIELD | Admitting: Internal Medicine

## 2016-09-13 ENCOUNTER — Encounter: Payer: Self-pay | Admitting: Internal Medicine

## 2016-09-13 DIAGNOSIS — R0781 Pleurodynia: Secondary | ICD-10-CM | POA: Diagnosis not present

## 2016-09-13 MED ORDER — OXYCODONE-ACETAMINOPHEN 5-325 MG PO TABS: 1.0000 | ORAL_TABLET | Freq: Four times a day (QID) | ORAL | 0 refills | Status: DC | PRN

## 2016-09-13 MED ORDER — CYCLOBENZAPRINE HCL 10 MG PO TABS: 10.0000 mg | ORAL_TABLET | Freq: Three times a day (TID) | ORAL | 0 refills | Status: DC | PRN

## 2016-09-13 NOTE — Progress Notes (Signed)
Pre visit review using our clinic review tool, if applicable. No additional management support is needed unless otherwise documented below in the visit note. 

## 2016-09-13 NOTE — Assessment & Plan Note (Signed)
Fracture on CT (done to rule out dissection). He was given limited amount of percocet and given refill for limited amount today. This will not be refilled without visit and he knows that I will not prescribed this long term for him. We talked about expected course for improvement with pain in 1-2 weeks and full healing 4-6 weeks.

## 2016-09-13 NOTE — Patient Instructions (Signed)
We have given you the percocet and sent in the flexeril prescription.   You can use heat or ice on the area to help with pain based on what works.
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
## 2016-09-15 ENCOUNTER — Other Ambulatory Visit: Payer: Self-pay | Admitting: Internal Medicine

## 2016-09-15 ENCOUNTER — Encounter: Payer: Self-pay | Admitting: Internal Medicine

## 2016-11-01 ENCOUNTER — Ambulatory Visit (INDEPENDENT_AMBULATORY_CARE_PROVIDER_SITE_OTHER): Payer: BLUE CROSS/BLUE SHIELD | Admitting: Neurology

## 2016-11-01 ENCOUNTER — Encounter: Payer: Self-pay | Admitting: Neurology

## 2016-11-01 VITALS — BP 116/72 | HR 112 | Ht 73.0 in | Wt 199.0 lb

## 2016-11-01 DIAGNOSIS — S069X9D Unspecified intracranial injury with loss of consciousness of unspecified duration, subsequent encounter: Secondary | ICD-10-CM

## 2016-11-01 DIAGNOSIS — F1910 Other psychoactive substance abuse, uncomplicated: Secondary | ICD-10-CM | POA: Diagnosis not present

## 2016-11-01 DIAGNOSIS — F418 Other specified anxiety disorders: Secondary | ICD-10-CM | POA: Diagnosis not present

## 2016-11-01 DIAGNOSIS — F32A Depression, unspecified: Secondary | ICD-10-CM

## 2016-11-01 DIAGNOSIS — F329 Major depressive disorder, single episode, unspecified: Secondary | ICD-10-CM

## 2016-11-01 DIAGNOSIS — F419 Anxiety disorder, unspecified: Secondary | ICD-10-CM

## 2016-11-01 NOTE — Patient Instructions (Signed)
I don't think there is anything neurologic from the brain that has caused any of the recent symptoms (likely related to drug use).  Your neurologic exam is normal.
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
## 2016-11-02 ENCOUNTER — Other Ambulatory Visit: Payer: Self-pay | Admitting: Family Medicine

## 2016-11-02 NOTE — Telephone Encounter (Signed)
Refill done.  

## 2017-02-05 ENCOUNTER — Other Ambulatory Visit: Payer: Self-pay | Admitting: Family Medicine

## 2017-05-02 ENCOUNTER — Other Ambulatory Visit: Payer: Self-pay | Admitting: Family Medicine

## 2017-05-02 NOTE — Telephone Encounter (Signed)
Refill done. Pt needs an OV for future refills.  

## 2017-08-14 ENCOUNTER — Other Ambulatory Visit: Payer: Self-pay | Admitting: Family Medicine

## 2017-08-17 ENCOUNTER — Other Ambulatory Visit: Payer: Self-pay | Admitting: Family Medicine

## 2017-08-20 NOTE — Telephone Encounter (Signed)
Refill denied. Pt has not been seen in over a year.  

## 2017-08-31 ENCOUNTER — Ambulatory Visit (INDEPENDENT_AMBULATORY_CARE_PROVIDER_SITE_OTHER): Payer: BLUE CROSS/BLUE SHIELD

## 2017-08-31 DIAGNOSIS — Z23 Encounter for immunization: Secondary | ICD-10-CM
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
## 2017-11-14 ENCOUNTER — Ambulatory Visit: Payer: Self-pay | Admitting: Family Medicine

## 2017-11-14 ENCOUNTER — Other Ambulatory Visit: Payer: Self-pay | Admitting: Sports Medicine

## 2017-11-14 DIAGNOSIS — M79671 Pain in right foot: Secondary | ICD-10-CM

## 2017-11-15 ENCOUNTER — Ambulatory Visit: Payer: Self-pay | Admitting: Family Medicine

## 2017-11-19 ENCOUNTER — Ambulatory Visit
Admission: RE | Admit: 2017-11-19 | Discharge: 2017-11-19 | Disposition: A | Discharge status: Home / Self Care | Payer: BLUE CROSS/BLUE SHIELD | Source: Ambulatory Visit | Attending: Sports Medicine | Admitting: Sports Medicine

## 2017-11-19 DIAGNOSIS — M79671 Pain in right foot: Secondary | ICD-10-CM

## 2017-11-30 ENCOUNTER — Other Ambulatory Visit: Payer: Self-pay | Admitting: Family Medicine

## 2018-09-18 ENCOUNTER — Ambulatory Visit (INDEPENDENT_AMBULATORY_CARE_PROVIDER_SITE_OTHER): Payer: BLUE CROSS/BLUE SHIELD

## 2018-09-18 DIAGNOSIS — Z23 Encounter for immunization: Secondary | ICD-10-CM | POA: Diagnosis not present
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
## 2018-10-09 ENCOUNTER — Encounter: Payer: Self-pay | Admitting: Family Medicine

## 2018-10-09 ENCOUNTER — Encounter

## 2018-10-09 ENCOUNTER — Ambulatory Visit: Payer: BLUE CROSS/BLUE SHIELD | Admitting: Family Medicine

## 2018-10-09 DIAGNOSIS — G2589 Other specified extrapyramidal and movement disorders: Secondary | ICD-10-CM | POA: Diagnosis not present

## 2018-10-09 MED ORDER — VITAMIN D (ERGOCALCIFEROL) 1.25 MG (50000 UNIT) PO CAPS: 50000.0000 [IU] | ORAL_CAPSULE | ORAL | 0 refills | Status: DC

## 2018-10-09 MED ORDER — CYCLOBENZAPRINE HCL 10 MG PO TABS: 10.0000 mg | ORAL_TABLET | Freq: Three times a day (TID) | ORAL | 3 refills | Status: DC | PRN

## 2018-10-09 NOTE — Patient Instructions (Signed)
Good to see you  Ice is your friend Exercises 3 times a week.  Keep hands within peripheral vision  Once weekly vitamin D for 12 weeks See me again in 6 weeks to see how you are doing

## 2018-10-09 NOTE — Assessment & Plan Note (Signed)
Scapular dyskinesis. Patient given home exercises, we discussed possibly starting repeating formal physical therapy.  Refill muscle relaxers.  Follow-up again in 4 weeks

## 2018-11-20 ENCOUNTER — Ambulatory Visit: Payer: BLUE CROSS/BLUE SHIELD | Admitting: Family Medicine

## 2018-11-20 NOTE — Progress Notes (Deleted)
[name] [place], [place] [address] Phone: [phone] Subjective:    I'm seeing this patient by the request  of:    CC: Back and neck pain follow-up  ION:[id number]  [name] is a 37 y.o. male coming in with complaint of right back and neck pain follow-up.  Was having low back pain.  Seems to be doing somewhat better with muscle relaxers and hydroxyzine.  Patient was to start formal physical therapy.  Patient states    Past Medical History:  Diagnosis Date  . Alcohol abuse   . Anxiety   . Anxiety and depression   . Benzodiazepine dependence ([place])   . Depression   . Difficulty urinating    cant urinate in public places (restrooms)  . Diverticulitis   . Opioid dependence ([place])   . Pilonidal cyst   . Seasonal allergies   . Skull fracture ([place]) 2004   Trauma  . Tension headache    Past Surgical History:  Procedure Laterality Date  . ORIF right 3rd finger  2012   Dr. Fredna Dow - see hospital notes.  . pilonidal cystectomy  08/01/2011  . RADIOFREQUENCY ABLATION NERVES  12/10/2012  . VASECTOMY  04/2010  . WISDOM TOOTH EXTRACTION     Social History   Socioeconomic History  . Marital status: Married    Spouse name: Not on file  . Number of children: 0  . Years of education: 69  . Highest education level: Not on file  Occupational History  . Occupation: unemployed  Social Needs  . Financial resource strain: Not on file  . Food insecurity:    Worry: Not on file    Inability: Not on file  . Transportation needs:    Medical: Not on file    Non-medical: Not on file  Tobacco Use  . Smoking status: Former Smoker    Packs/day: 1.50    Years: 7.00    Pack years: 10.50    Types: Cigarettes    Last attempt to quit: 12/04/2006    Years since quitting: 11.9  . Smokeless tobacco: Never Used  . Tobacco comment: quit 2 or more years  Substance and Sexual Activity  . Alcohol use: No    Alcohol/week: 0.0 standard drinks  . Drug use:  Yes    Types: Marijuana    Comment: last smoke a "few days ago"  . Sexual activity: Yes    Partners: Female    Birth control/protection: None  Lifestyle  . Physical activity:    Days per week: Not on file    Minutes per session: Not on file  . Stress: Not on file  Relationships  . Social connections:    Talks on phone: Not on file    Gets together: Not on file    Attends religious service: Not on file    Active member of club or organization: Not on file    Attends meetings of clubs or organizations: Not on file    Relationship status: Not on file  Other Topics Concern  . Not on file  Social History Narrative   HSG, attending [employer] (09/2010). Married June 2011.No children. NO history of physical or sexual abuse. Not working at this time.             Allergies  Allergen Reactions  . Avelox [Moxifloxacin Hcl In Nacl] Nausea And Vomiting  . Fluoxetine     Intolerant of all SSRI's, NE drugs  . Other  Nausea And Vomiting    Strong antibiotics by mouth / antidepressants  . Moxifloxacin Nausea And Vomiting   Family History  Problem Relation Age of Onset  . Depression Mother   . Anxiety disorder Mother   . Alcohol abuse Mother   . Hypertension Father   . Hyperlipidemia Father   . Colon polyps Paternal Grandfather   . Cancer Paternal Aunt        breast  . Colon cancer Neg Hx          Current Outpatient Medications (Other):  .  cyclobenzaprine (FLEXERIL) 10 MG tablet, Take 1 tablet (10 mg total) by mouth 3 (three) times daily as needed for muscle spasms. .  cyclobenzaprine (FLEXERIL) 5 MG tablet, TAKE 1 TABLET BY MOUTH 3 TIMES DAILY AS NEEDED FOR MUSCLE SPASMS .  hydrOXYzine (VISTARIL) 25 MG capsule, Take 75 mg by mouth at bedtime as needed for anxiety.  .  Vitamin D, Ergocalciferol, (DRISDOL) 1.25 MG (50000 UT) CAPS capsule, Take 1 capsule (50,000 Units total) by mouth every 7 (seven) days.    Past medical history, social, surgical and family history all reviewed in  electronic medical record.  No pertanent information unless stated regarding to the chief complaint.   Review of Systems:  No headache, visual changes, nausea, vomiting, diarrhea, constipation, dizziness, abdominal pain, skin rash, fevers, chills, night sweats, weight loss, swollen lymph nodes, body aches, joint swelling, muscle aches, chest pain, shortness of breath, mood changes.   Objective  There were no vitals taken for this visit. Systems examined below as of    General: No apparent distress alert and oriented x3 mood and affect normal, dressed appropriately.  HEENT: Pupils equal, extraocular movements intact  Respiratory: Patient's speak in full sentences and does not appear short of breath  Cardiovascular: No lower extremity edema, non tender, no erythema  Skin: Warm dry intact with no signs of infection or rash on extremities or on axial skeleton.  Abdomen: Soft nontender  Neuro: Cranial nerves II through XII are intact, neurovascularly intact in all extremities with 2+ DTRs and 2+ pulses.  Lymph: No lymphadenopathy of posterior or anterior cervical chain or axillae bilaterally.  Gait normal with good balance and coordination.  MSK:  Non tender with full range of motion and good stability and symmetric strength and tone of shoulders, elbows, wrist, hip, knee and ankles bilaterally.  Neck: Inspection unremarkable. No palpable stepoffs. Negative Spurling's maneuver. Full neck range of motion Grip strength and sensation normal in bilateral hands Strength good C4 to T1 distribution No sensory change to C4 to T1 Negative Hoffman sign bilaterally Reflexes normal  Back Exam:  Inspection: Unremarkable  Motion: Flexion 45 deg, Extension 45 deg, Side Bending to 45 deg bilaterally,  Rotation to 45 deg bilaterally  SLR laying: Negative  XSLR laying: Negative  Palpable tenderness: None. FABER: negative. Sensory change: Gross sensation intact to all lumbar and sacral dermatomes.    Reflexes: 2+ at both patellar tendons, 2+ at achilles tendons, Babinski's downgoing.  Strength at foot  Plantar-flexion: 5/5 Dorsi-flexion: 5/5 Eversion: 5/5 Inversion: 5/5  Leg strength  Quad: 5/5 Hamstring: 5/5 Hip flexor: 5/5 Hip abductors: 5/5  Gait unremarkable.   Impression and Recommendations:     This case required medical decision making of moderate complexity. The above documentation has been reviewed and is accurate and complete [name], DO       Note: This dictation was prepared with Dragon dictation along with smaller phrase technology. Any transcriptional errors  that result from this process are unintentional.

## 2018-12-25 ENCOUNTER — Other Ambulatory Visit: Payer: Self-pay | Admitting: Family Medicine

## 2019-01-02 DIAGNOSIS — F410 Panic disorder [episodic paroxysmal anxiety] without agoraphobia: Secondary | ICD-10-CM | POA: Diagnosis not present

## 2019-01-08 ENCOUNTER — Ambulatory Visit: Payer: BLUE CROSS/BLUE SHIELD | Admitting: Family Medicine

## 2019-01-08 ENCOUNTER — Encounter: Payer: Self-pay | Admitting: Family Medicine

## 2019-01-08 VITALS — BP 110/70 | HR 124 | Ht 73.0 in | Wt 179.0 lb

## 2019-01-08 DIAGNOSIS — M4317 Spondylolisthesis, lumbosacral region: Secondary | ICD-10-CM

## 2019-01-08 DIAGNOSIS — M545 Low back pain, unspecified: Secondary | ICD-10-CM

## 2019-01-08 NOTE — Progress Notes (Signed)
[name] [place], [place] [address] Phone: [phone] Subjective:      CC: Mid and low back pain  [id number]   10/09/2018:  Scapular dyskinesis. Patient given home exercises, we discussed possibly starting repeating formal physical therapy.  Refill muscle relaxers.  Follow-up again in 4 weeks Update 01/08/2019:  [name] is a 38 y.o. male coming in with complaint of thoracic spine and back pain. Patient states that he had a lot of stress over the holidays and he was lost of follow up. He has been working on his HEP for posture and his feet since last visit. Is not sure he can get back to cycling at this time due to fractures from 01/2018.  Patient states that he is making improvement.  Has noticed overall doing relatively well.  Has not use of muscle relaxers very regularly.      Past Medical History:  Diagnosis Date  . Alcohol abuse   . Anxiety   . Anxiety and depression   . Benzodiazepine dependence ([place])   . Depression   . Difficulty urinating    cant urinate in public places (restrooms)  . Diverticulitis   . Opioid dependence ([place])   . Pilonidal cyst   . Seasonal allergies   . Skull fracture ([place]) 2004   Trauma  . Tension headache    Past Surgical History:  Procedure Laterality Date  . ORIF right 3rd finger  2012   Dr. Fredna Dow - see hospital notes.  . pilonidal cystectomy  08/01/2011  . RADIOFREQUENCY ABLATION NERVES  12/10/2012  . VASECTOMY  04/2010  . WISDOM TOOTH EXTRACTION     Social History   Socioeconomic History  . Marital status: Married    Spouse name: Not on file  . Number of children: 0  . Years of education: 6  . Highest education level: Not on file  Occupational History  . Occupation: unemployed  Social Needs  . Financial resource strain: Not on file  . Food insecurity:    Worry: Not on file    Inability: Not on file  . Transportation needs:    Medical: Not on file    Non-medical: Not on  file  Tobacco Use  . Smoking status: Former Smoker    Packs/day: 1.50    Years: 7.00    Pack years: 10.50    Types: Cigarettes    Last attempt to quit: 12/04/2006    Years since quitting: 12.1  . Smokeless tobacco: Never Used  . Tobacco comment: quit 2 or more years  Substance and Sexual Activity  . Alcohol use: No    Alcohol/week: 0.0 standard drinks  . Drug use: Yes    Types: Marijuana    Comment: last smoke a "few days ago"  . Sexual activity: Yes    Partners: Female    Birth control/protection: None  Lifestyle  . Physical activity:    Days per week: Not on file    Minutes per session: Not on file  . Stress: Not on file  Relationships  . Social connections:    Talks on phone: Not on file    Gets together: Not on file    Attends religious service: Not on file    Active member of club or organization: Not on file    Attends meetings of clubs or organizations: Not on file    Relationship status: Not on file  Other Topics Concern  . Not on file  Social History Narrative   HSG, attending [employer] (09/2010). Married June 2011.No children. NO history of physical or sexual abuse. Not working at this time.             Allergies  Allergen Reactions  . Avelox [Moxifloxacin Hcl In Nacl] Nausea And Vomiting  . Fluoxetine     Intolerant of all SSRI's, NE drugs  . Other Nausea And Vomiting    Strong antibiotics by mouth / antidepressants  . Moxifloxacin Nausea And Vomiting   Family History  Problem Relation Age of Onset  . Depression Mother   . Anxiety disorder Mother   . Alcohol abuse Mother   . Hypertension Father   . Hyperlipidemia Father   . Colon polyps Paternal Grandfather   . Cancer Paternal Aunt        breast  . Colon cancer Neg Hx          Current Outpatient Medications (Other):  .  cyclobenzaprine (FLEXERIL) 10 MG tablet, Take 1 tablet (10 mg total) by mouth 3 (three) times daily as needed for muscle spasms. .  cyclobenzaprine (FLEXERIL) 5 MG tablet, TAKE  1 TABLET BY MOUTH 3 TIMES DAILY AS NEEDED FOR MUSCLE SPASMS .  hydrOXYzine (VISTARIL) 25 MG capsule, Take 75 mg by mouth at bedtime as needed for anxiety.  .  Vitamin D, Ergocalciferol, (DRISDOL) 1.25 MG (50000 UT) CAPS capsule, TAKE 1 CAPSULE (50,000 UNITS TOTAL) BY MOUTH EVERY 7 (SEVEN) DAYS.    Past medical history, social, surgical and family history all reviewed in electronic medical record.  No pertanent information unless stated regarding to the chief complaint.   Review of Systems:  No headache, visual changes, nausea, vomiting, diarrhea, constipation, dizziness, abdominal pain, skin rash, fevers, chills, night sweats, weight loss, swollen lymph nodes, body aches, joint swelling,chest pain, shortness of breath, mood changes.  Mild positive muscle aches  Objective  Blood pressure 110/70, pulse (!) 124, height 6\' 1"  (1.854 m), weight 179 lb (81.2 kg), SpO2 98 %.    General: No apparent distress alert and oriented x3 mood and affect normal, dressed appropriately.  HEENT: Pupils equal, extraocular movements intact  Respiratory: Patient's speak in full sentences and does not appear short of breath  Cardiovascular: No lower extremity edema, non tender, no erythema  Skin: Warm dry intact with no signs of infection or rash on extremities or on axial skeleton.  Abdomen: Soft nontender  Neuro: Cranial nerves II through XII are intact, neurovascularly intact in all extremities with 2+ DTRs and 2+ pulses.  Lymph: No lymphadenopathy of posterior or anterior cervical chain or axillae bilaterally.  Gait normal with good balance and coordination.  MSK:  Non tender with full range of motion and good stability and symmetric strength and tone of shoulders, elbows, wrist, hip, knee and ankles bilaterally.  Mild scapular dyskinesis noted but otherwise fairly unremarkable mild discomfort in the lumbar spine with mild loss of lordosis but otherwise relatively normal   Impression and Recommendations:       The above documentation has been reviewed and is accurate and complete Hulan Saas, DO.        Note: This dictation was prepared with Dragon dictation along with smaller phrase technology. Any transcriptional errors that result from this process are unintentional.

## 2019-01-08 NOTE — Assessment & Plan Note (Signed)
Doing well at this time.  Continue conservative therapy.  Muscle relaxer as needed, vitamin D encouraged to continue with patient and making improvement.  Follow-up with me again as needed

## 2019-01-08 NOTE — Patient Instructions (Addendum)
Good to see you  I am glad you are doing well  Continue the vitamin D Stay active K2 daily can help and look at whole foods For tart cherry llook at [employer] or [employer] made Hold on the CoQ10  See me again in 8 weeks but send me a message otherwise

## 2019-01-22 DIAGNOSIS — H04123 Dry eye syndrome of bilateral lacrimal glands: Secondary | ICD-10-CM | POA: Diagnosis not present

## 2019-01-22 DIAGNOSIS — H5203 Hypermetropia, bilateral: Secondary | ICD-10-CM | POA: Diagnosis not present

## 2019-03-04 ENCOUNTER — Telehealth: Payer: Self-pay | Admitting: *Deleted

## 2019-03-04 NOTE — Progress Notes (Signed)
[name] [place], [name] [address] Phone: [phone] Subjective:   Virtual Visit via Video Note  I connected with [name] on 03/04/19 at 10:00 AM EDT by a video enabled telemedicine application and verified that I am speaking with the correct person using two identifiers.   I discussed the limitations of evaluation and management by telemedicine and the availability of in person appointments. The patient expressed understanding and agreed to proceed.    I discussed the assessment and treatment plan with the patient. The patient was provided an opportunity to ask questions and all were answered. The patient agreed with the plan and demonstrated an understanding of the instructions.   The patient was advised to call back or seek an in-person evaluation if the symptoms worsen or if the condition fails to improve as anticipated.  Spent  25 minutes with patient and had greater than 50% of counseling including as described above in assessment and plan.   [name], DO  Detailed note below     CC: Back pain follow-up  DJM:[id number]  LIEF PALMATIER is a 38 y.o. male coming in with complaint of back pain follow-up.  Has known scapular dyskinesis.  Patient has been doing some of the exercises.  Patient states along with the home quarantine has not been quite as active.  Patient is in the process of trying to change 1 room in his house to do the exercises on a regular basis.  Does have underlying anxiety and has had significant worsening recently.  Patient is taking both the Librium and Klonopin on a very regular basis.  Has had difficulty with antidepressants in the past     Past Medical History:  Diagnosis Date  . Alcohol abuse   . Anxiety   . Anxiety and depression   . Benzodiazepine dependence ([place])   . Depression   . Difficulty urinating    cant urinate in public places (restrooms)  . Diverticulitis   . Opioid dependence  ([place])   . Pilonidal cyst   . Seasonal allergies   . Skull fracture ([place]) 2004   Trauma  . Tension headache    Past Surgical History:  Procedure Laterality Date  . ORIF right 3rd finger  2012   Dr. Fredna Dow - see hospital notes.  . pilonidal cystectomy  08/01/2011  . RADIOFREQUENCY ABLATION NERVES  12/10/2012  . VASECTOMY  04/2010  . WISDOM TOOTH EXTRACTION     Social History   Socioeconomic History  . Marital status: Married    Spouse name: Not on file  . Number of children: 0  . Years of education: 49  . Highest education level: Not on file  Occupational History  . Occupation: unemployed  Social Needs  . Financial resource strain: Not on file  . Food insecurity:    Worry: Not on file    Inability: Not on file  . Transportation needs:    Medical: Not on file    Non-medical: Not on file  Tobacco Use  . Smoking status: Former Smoker    Packs/day: 1.50    Years: 7.00    Pack years: 10.50    Types: Cigarettes    Last attempt to quit: 12/04/2006    Years since quitting: 12.2  . Smokeless tobacco: Never Used  . Tobacco comment: quit 2 or more years  Substance and Sexual Activity  . Alcohol use: No    Alcohol/week: 0.0 standard drinks  .  Drug use: Yes    Types: Marijuana    Comment: last smoke a "few days ago"  . Sexual activity: Yes    Partners: Female    Birth control/protection: None  Lifestyle  . Physical activity:    Days per week: Not on file    Minutes per session: Not on file  . Stress: Not on file  Relationships  . Social connections:    Talks on phone: Not on file    Gets together: Not on file    Attends religious service: Not on file    Active member of club or organization: Not on file    Attends meetings of clubs or organizations: Not on file    Relationship status: Not on file  Other Topics Concern  . Not on file  Social History Narrative   HSG, attending [employer] (09/2010). Married June 2011.No children. NO history of physical or sexual abuse. Not  working at this time.             Allergies  Allergen Reactions  . Avelox [Moxifloxacin Hcl In Nacl] Nausea And Vomiting  . Fluoxetine     Intolerant of all SSRI's, NE drugs  . Other Nausea And Vomiting    Strong antibiotics by mouth / antidepressants  . Moxifloxacin Nausea And Vomiting   Family History  Problem Relation Age of Onset  . Depression Mother   . Anxiety disorder Mother   . Alcohol abuse Mother   . Hypertension Father   . Hyperlipidemia Father   . Colon polyps Paternal Grandfather   . Cancer Paternal Aunt        breast  . Colon cancer Neg Hx          Current Outpatient Medications (Other):  .  cyclobenzaprine (FLEXERIL) 10 MG tablet, Take 1 tablet (10 mg total) by mouth 3 (three) times daily as needed for muscle spasms. .  cyclobenzaprine (FLEXERIL) 5 MG tablet, TAKE 1 TABLET BY MOUTH 3 TIMES DAILY AS NEEDED FOR MUSCLE SPASMS .  hydrOXYzine (VISTARIL) 25 MG capsule, Take 75 mg by mouth at bedtime as needed for anxiety.  .  Vitamin D, Ergocalciferol, (DRISDOL) 1.25 MG (50000 UT) CAPS capsule, TAKE 1 CAPSULE (50,000 UNITS TOTAL) BY MOUTH EVERY 7 (SEVEN) DAYS.    Past medical history, social, surgical and family history all reviewed in electronic medical record.  No pertanent information unless stated regarding to the chief complaint.   Review of Systems:  No headache, visual changes, nausea, vomiting, diarrhea, constipation, dizziness, abdominal pain, skin rash, fevers, chills, night sweats, weight loss, swollen lymph nodes, body aches, joint swelling, positive muscle aches and some increase in anxiety  Objective     General: No apparent distress virtual visit occurred.     Impression and Recommendations:    . The above documentation has been reviewed and is accurate and complete [name], DO       Note: This dictation was prepared with Dragon dictation along with smaller phrase technology. Any transcriptional errors that result from this  process are unintentional.

## 2019-03-04 NOTE — Telephone Encounter (Signed)
Copied from [place] [phone]. Topic: Appointment Scheduling - Scheduling Inquiry for Clinic >> Mar 04, 2019  8:13 AM Scherrie Gerlach wrote: Reason for CRM: pt calling about his appt tomorrow with Dr Tamala Julian.  He states he did e check in and something came up about virtual visit. Please advise

## 2019-03-04 NOTE — Telephone Encounter (Signed)
Changed visit to virtual

## 2019-03-05 ENCOUNTER — Encounter: Payer: Self-pay | Admitting: Family Medicine

## 2019-03-05 ENCOUNTER — Ambulatory Visit (INDEPENDENT_AMBULATORY_CARE_PROVIDER_SITE_OTHER): Payer: BLUE CROSS/BLUE SHIELD | Admitting: Family Medicine

## 2019-03-05 DIAGNOSIS — M4317 Spondylolisthesis, lumbosacral region: Secondary | ICD-10-CM

## 2019-03-05 DIAGNOSIS — F1320 Sedative, hypnotic or anxiolytic dependence, uncomplicated: Secondary | ICD-10-CM

## 2019-03-05 DIAGNOSIS — G2589 Other specified extrapyramidal and movement disorders: Secondary | ICD-10-CM

## 2019-03-05 NOTE — Assessment & Plan Note (Signed)
Scapular dyskinesis as well as spondylolisthesis at L5-S1.  Noting these will be chronic problems.  I do encourage patient to work out to help his anxiety as well on a regular basis.  We discussed the importance of core stability and core strength.  Discussed trying to get a routine during this time to and how this would be beneficial.  Patient is taking a muscle relaxer fairly regularly at night to help him.  Has refill for 90 days.  Discussed with patient in great length.  Patient will try to increase activity slowly.  Follow-up again 4 weeks for another virtual visit

## 2019-03-05 NOTE — Patient Instructions (Addendum)
Good to see you  Valerian root. Could be good. Would take it at night if you are trying  Try to find time when you can

## 2019-03-12 ENCOUNTER — Other Ambulatory Visit: Payer: Self-pay | Admitting: Family Medicine

## 2019-06-09 ENCOUNTER — Other Ambulatory Visit: Payer: Self-pay | Admitting: Family Medicine

## 2019-08-06 ENCOUNTER — Telehealth: Payer: Self-pay

## 2019-08-06 NOTE — Telephone Encounter (Signed)
Called patient to schedule virtual appointment with Dr. Tamala Julian. Left message to call back.

## 2019-08-07 ENCOUNTER — Telehealth: Payer: Self-pay

## 2019-08-07 NOTE — Telephone Encounter (Signed)
Patient wife returned call and scheduled virtual visit.

## 2019-08-14 ENCOUNTER — Ambulatory Visit: Payer: BLUE CROSS/BLUE SHIELD | Admitting: Family Medicine

## 2019-08-14 ENCOUNTER — Ambulatory Visit (INDEPENDENT_AMBULATORY_CARE_PROVIDER_SITE_OTHER): Payer: BC Managed Care – PPO | Admitting: Family Medicine

## 2019-08-14 ENCOUNTER — Encounter: Payer: Self-pay | Admitting: Family Medicine

## 2019-08-14 DIAGNOSIS — M7551 Bursitis of right shoulder: Secondary | ICD-10-CM | POA: Diagnosis not present

## 2019-08-14 MED ORDER — PREDNISONE 50 MG PO TABS: 50.0000 mg | ORAL_TABLET | Freq: Every day | ORAL | 0 refills | Status: DC

## 2019-08-14 NOTE — Progress Notes (Signed)
Virtual Visit via Video Note  I connected with [name] on 08/14/19 at  3:15 PM EDT by a video enabled telemedicine application and verified that I am speaking with the correct person using two identifiers.  Location: Patient: In home setting with wife Provider: In office setting   I discussed the limitations of evaluation and management by telemedicine and the availability of in person appointments. The patient expressed understanding and agreed to proceed.  History of Present Illness:  Insidious onset of right shoulder pain. Pain deep in the joint. First noticed pain when he was taking gallon of milk from fridge and almost dropped it. Pain occurring when he tries to lift anything heavy especially the more he flexes his shoulder. Does note that he has a history of joint laxity as he has had a previous dislocation and the ability to "pop" his shoulder out of joint as a child. Does not feel like this injury was a dislocation. Denies any radiating symptoms.   Unable to perform HEP for back due to shoulder pain so back pain has gotten somewhat worse. Is able to manage back pain with stability exercises.    Is taking Flexerill for pain management for both his back and shoulder.  Observations/Objective: Alert and oriented x3, patient has good movement but can tell when patient flexes his shoulder greater than 90 degrees of sidebending or abduction patient does have a grimace on his face.  He is able to have full range of motion.  Does seem to have some tightness with internal range of motion.  Patient able to move hand and make a fist with no difficulty.  Neck has near full range of motion as well.   Assessment and Plan: Subacromial bursitis Encourage home exercises, 5 days of prednisone, which activities to do which wants to avoid.  Increase activity.  Follow-up in 3 weeks.  In 3 weeks if not better will consider ultrasound and likely injection if necessary  Follow Up Instructions:    I  discussed the assessment and treatment plan with the patient. The patient was provided an opportunity to ask questions and all were answered. The patient agreed with the plan and demonstrated an understanding of the instructions.   The patient was advised to call back or seek an in-person evaluation if the symptoms worsen or if the condition fails to improve as anticipated.  I provided 26 minutes of face-to-face time during this encounter.   [name], DO

## 2019-08-15 ENCOUNTER — Ambulatory Visit: Payer: BLUE CROSS/BLUE SHIELD | Admitting: Family Medicine

## 2019-08-17 ENCOUNTER — Other Ambulatory Visit: Payer: Self-pay | Admitting: Family Medicine

## 2019-08-25 ENCOUNTER — Encounter: Payer: Self-pay | Admitting: Family Medicine

## 2019-08-28 ENCOUNTER — Other Ambulatory Visit: Payer: Self-pay | Admitting: Family Medicine

## 2019-09-02 ENCOUNTER — Ambulatory Visit: Payer: BLUE CROSS/BLUE SHIELD | Admitting: Family Medicine

## 2019-09-05 ENCOUNTER — Other Ambulatory Visit: Payer: Self-pay

## 2019-09-05 ENCOUNTER — Ambulatory Visit: Payer: Self-pay

## 2019-09-05 ENCOUNTER — Encounter: Payer: Self-pay | Admitting: Family Medicine

## 2019-09-05 ENCOUNTER — Ambulatory Visit: Payer: BC Managed Care – PPO | Admitting: Family Medicine

## 2019-09-05 VITALS — BP 116/80 | HR 105 | Ht 73.0 in

## 2019-09-05 DIAGNOSIS — G8929 Other chronic pain: Secondary | ICD-10-CM | POA: Diagnosis not present

## 2019-09-05 DIAGNOSIS — M25511 Pain in right shoulder: Secondary | ICD-10-CM

## 2019-09-05 DIAGNOSIS — S43431A Superior glenoid labrum lesion of right shoulder, initial encounter: Secondary | ICD-10-CM | POA: Diagnosis not present

## 2019-09-05 NOTE — Patient Instructions (Addendum)
Good to see you Injection today  Exercise 3 times a week See me again in 5 weeks

## 2019-09-05 NOTE — Assessment & Plan Note (Signed)
Patient given injection and tolerated the procedure well.  Discussed icing regimen and home exercise, which activities to do which wants to avoid.  Patient is to increase activity slowly over the course the next several weeks.  Patient will follow-up with me again in 4 weeks if necessary.  May need advanced imaging that I am hoping the patient will do all right

## 2019-09-05 NOTE — Progress Notes (Signed)
[name] [address] [place], [place] [address] Phone: [phone] Subjective:   [name] am serving as a Education administrator for Dr. Hulan Saas.  I'm seeing this patient by the request  of:    CC: Shoulder pain follow-up  [id number]   08/14/2019 Encourage home exercises, 5 days of prednisone, which activities to do which wants to avoid.  Increase activity.  Follow-up in 3 weeks.  In 3 weeks if not better will consider ultrasound and likely injection if necessary  09/05/2019 [name] is a 38 y.o. male coming in with complaint of right shoulder pain. States the shoulder is painful. Believes the steroid was helpful but now having worsening pain again.  Has had dislocation of the shoulders previously.  Last one was greater than 15 years ago.       Past Medical History:  Diagnosis Date  . Alcohol abuse   . Anxiety   . Anxiety and depression   . Benzodiazepine dependence ([place])   . Depression   . Difficulty urinating    cant urinate in public places (restrooms)  . Diverticulitis   . Opioid dependence ([place])   . Pilonidal cyst   . Seasonal allergies   . Skull fracture ([place]) 2004   Trauma  . Tension headache    Past Surgical History:  Procedure Laterality Date  . ORIF right 3rd finger  2012   Dr. Fredna Dow - see hospital notes.  . pilonidal cystectomy  08/01/2011  . RADIOFREQUENCY ABLATION NERVES  12/10/2012  . VASECTOMY  04/2010  . WISDOM TOOTH EXTRACTION     Social History   Socioeconomic History  . Marital status: Married    Spouse name: Not on file  . Number of children: 0  . Years of education: 73  . Highest education level: Not on file  Occupational History  . Occupation: unemployed  Social Needs  . Financial resource strain: Not on file  . Food insecurity    Worry: Not on file    Inability: Not on file  . Transportation needs    Medical: Not on file    Non-medical: Not on file  Tobacco Use  . Smoking status: Former Smoker     Packs/day: 1.50    Years: 7.00    Pack years: 10.50    Types: Cigarettes    Quit date: 12/04/2006    Years since quitting: 12.7  . Smokeless tobacco: Never Used  . Tobacco comment: quit 2 or more years  Substance and Sexual Activity  . Alcohol use: No    Alcohol/week: 0.0 standard drinks  . Drug use: Yes    Types: Marijuana    Comment: last smoke a "few days ago"  . Sexual activity: Yes    Partners: Female    Birth control/protection: None  Lifestyle  . Physical activity    Days per week: Not on file    Minutes per session: Not on file  . Stress: Not on file  Relationships  . Social Herbalist on phone: Not on file    Gets together: Not on file    Attends religious service: Not on file    Active member of club or organization: Not on file    Attends meetings of clubs or organizations: Not on file    Relationship status: Not on file  Other Topics Concern  . Not on file  Social History Narrative   HSG, attending [employer] (09/2010). Married June 2011.No children.  NO history of physical or sexual abuse. Not working at this time.             Allergies  Allergen Reactions  . Avelox [Moxifloxacin Hcl In Nacl] Nausea And Vomiting  . Fluoxetine     Intolerant of all SSRI's, NE drugs  . Other Nausea And Vomiting    Strong antibiotics by mouth / antidepressants  . Moxifloxacin Nausea And Vomiting   Family History  Problem Relation Age of Onset  . Depression Mother   . Anxiety disorder Mother   . Alcohol abuse Mother   . Hypertension Father   . Hyperlipidemia Father   . Colon polyps Paternal Grandfather   . Cancer Paternal Aunt        breast  . Colon cancer Neg Hx          Current Outpatient Medications (Other):  .  cyclobenzaprine (FLEXERIL) 10 MG tablet, TAKE 1 TABLET BY MOUTH THREE TIMES A DAY AS NEEDED FOR MUSCLE SPASMS .  cyclobenzaprine (FLEXERIL) 5 MG tablet, TAKE 1 TABLET BY MOUTH 3 TIMES DAILY AS NEEDED FOR MUSCLE SPASMS .  hydrOXYzine  (VISTARIL) 25 MG capsule, Take 75 mg by mouth at bedtime as needed for anxiety.  .  Vitamin D, Ergocalciferol, (DRISDOL) 1.25 MG (50000 UT) CAPS capsule, TAKE 1 CAPSULE (50,000 UNITS TOTAL) BY MOUTH EVERY 7 (SEVEN) DAYS.    Past medical history, social, surgical and family history all reviewed in electronic medical record.  No pertanent information unless stated regarding to the chief complaint.   Review of Systems:  No headache, visual changes, nausea, vomiting, diarrhea, constipation, dizziness, abdominal pain, skin rash, fevers, chills, night sweats, weight loss, swollen lymph nodes, body aches, joint swelling, muscle aches, chest pain, shortness of breath, mood changes.   Objective  Blood pressure 116/80, pulse (!) 105, height 6\' 1"  (1.854 m), SpO2 97 %.    General: No apparent distress alert and oriented x3 mood and affect normal, dressed appropriately.  HEENT: Pupils equal, extraocular movements intact  Respiratory: Patient's speak in full sentences and does not appear short of breath  Cardiovascular: No lower extremity edema, non tender, no erythema  Skin: Warm dry intact with no signs of infection or rash on extremities or on axial skeleton.  Abdomen: Soft nontender  Neuro: Cranial nerves II through XII are intact, neurovascularly intact in all extremities with 2+ DTRs and 2+ pulses.  Lymph: No lymphadenopathy of posterior or anterior cervical chain or axillae bilaterally.  Gait normal with good balance and coordination.  MSK:  Non tender with full range of motion and good stability and symmetric strength and tone of shoulders, elbows, wrist, hip, knee and ankles bilaterally.  Shoulder: Right Inspection reveals no abnormalities, atrophy or asymmetry. Palpation is normal with no tenderness over AC joint or bicipital groove. ROM is full in all planes. Rotator cuff strength normal throughout. No signs of impingement with negative Neer and Hawkin's tests, empty can sign. Speeds and  Yergason's tests normal. Positive O'Brien's Normal scapular function observed. No painful arc and no drop arm sign. No apprehension sign Contralateral shoulder unremarkable    MSK US performed of: Right shoulder This study was ordered, performed, and interpreted by Charlann Boxer D.O.  Shoulder:   Supraspinatus:  Appears normal on long and transverse views, no bursal bulge seen with shoulder abduction on impingement view. Infraspinatus:  Appears normal on long and transverse views. Subscapularis:  Appears normal on long and transverse views.Charna Archer joint:  Capsule undistended, no geyser sign.  Glenohumeral Joint:  Appears normal without effusion. Glenoid Labrum: Posterior labrum does have some calcific changes noted. Biceps Tendon:  Appears normal on long and transverse views, no fraying of tendon, tendon located in intertubercular groove, no subluxation with shoulder internal or external rotation. Impression: Calcific changes of the posterior labrum  Procedure: Real-time Ultrasound Guided Injection of right glenohumeral joint Device: GE Logiq Q7  Ultrasound guided injection is preferred based studies that show increased duration, increased effect, greater accuracy, decreased procedural pain, increased response rate with ultrasound guided versus blind injection.  Verbal informed consent obtained.  Time-out conducted.  Noted no overlying erythema, induration, or other signs of local infection.  Skin prepped in a sterile fashion.  Local anesthesia: Topical Ethyl chloride.  With sterile technique and under real time ultrasound guidance:  Joint visualized.  23g 1  inch needle inserted posterior approach. Pictures taken for needle placement. Patient did have injection of 2 cc of 1% lidocaine, 2 cc of 0.5% Marcaine, and 1.0 cc of Kenalog 40 mg/dL. Completed without difficulty  Pain immediately resolved suggesting accurate placement of the medication.  Advised to call if fevers/chills, erythema,  induration, drainage, or persistent bleeding.  Images permanently stored and available for review in the ultrasound unit.  Impression: Technically successful ultrasound guided injection.   Impression and Recommendations:     This case required medical decision making of moderate complexity. The above documentation has been reviewed and is accurate and complete [name], DO       Note: This dictation was prepared with Dragon dictation along with smaller phrase technology. Any transcriptional errors that result from this process are unintentional.

## 2019-09-12 ENCOUNTER — Encounter: Payer: Self-pay | Admitting: Family Medicine

## 2019-09-30 ENCOUNTER — Encounter: Payer: Self-pay | Admitting: Internal Medicine

## 2019-10-06 ENCOUNTER — Encounter: Payer: Self-pay | Admitting: Family Medicine

## 2019-10-10 ENCOUNTER — Encounter: Payer: Self-pay | Admitting: Family Medicine

## 2019-10-10 ENCOUNTER — Ambulatory Visit: Payer: Self-pay

## 2019-10-10 ENCOUNTER — Ambulatory Visit (INDEPENDENT_AMBULATORY_CARE_PROVIDER_SITE_OTHER): Payer: BC Managed Care – PPO | Admitting: Family Medicine

## 2019-10-10 VITALS — BP 110/72 | HR 103 | Ht 73.0 in | Wt 185.0 lb

## 2019-10-10 DIAGNOSIS — G8929 Other chronic pain: Secondary | ICD-10-CM

## 2019-10-10 DIAGNOSIS — M25511 Pain in right shoulder: Secondary | ICD-10-CM | POA: Diagnosis not present

## 2019-10-10 DIAGNOSIS — M25519 Pain in unspecified shoulder: Secondary | ICD-10-CM | POA: Insufficient documentation

## 2019-10-10 NOTE — Assessment & Plan Note (Signed)
Patient given injection today.  Tolerated the procedure well.  Discussed with her considering options to avoid.  I do believe the patient does have a labral tear in the shoulder and has done relatively well.  Did not want to repeat injection at this time but has started again in 6 weeks.  Follow-up again 8 to 10 weeks

## 2019-10-10 NOTE — Progress Notes (Signed)
[name] [address] [place], [place] [address] Phone: [phone] Subjective:   Fontaine No, am serving as a scribe for Dr. Hulan Saas.   CC: Right shoulder pain  [id number]   09/05/2019 Patient given injection and tolerated the procedure well.  Discussed icing regimen and home exercise, which activities to do which wants to avoid.  Patient is to increase activity slowly over the course the next several weeks.  Patient will follow-up with me again in 4 weeks if necessary.  May need advanced imaging that I am hoping the patient will do all right  Update 10/10/2019 AUNDRA CARLTON is a 38 y.o. male coming in with complaint of right shoulder pain. Patient had some relief from injection. Continues to have pain with abduction. Using Flexeril prn.  Patient states that certain reaching motions seem to be the most difficult aspect of it.      Past Medical History:  Diagnosis Date  . Alcohol abuse   . Anxiety   . Anxiety and depression   . Benzodiazepine dependence ([place])   . Depression   . Difficulty urinating    cant urinate in public places (restrooms)  . Diverticulitis   . Opioid dependence ([place])   . Pilonidal cyst   . Seasonal allergies   . Skull fracture ([place]) 2004   Trauma  . Tension headache    Past Surgical History:  Procedure Laterality Date  . ORIF right 3rd finger  2012   Dr. Fredna Dow - see hospital notes.  . pilonidal cystectomy  08/01/2011  . RADIOFREQUENCY ABLATION NERVES  12/10/2012  . VASECTOMY  04/2010  . WISDOM TOOTH EXTRACTION     Social History   Socioeconomic History  . Marital status: Married    Spouse name: Not on file  . Number of children: 0  . Years of education: 15  . Highest education level: Not on file  Occupational History  . Occupation: unemployed  Social Needs  . Financial resource strain: Not on file  . Food insecurity    Worry: Not on file    Inability: Not on file  . Transportation needs   Medical: Not on file    Non-medical: Not on file  Tobacco Use  . Smoking status: Former Smoker    Packs/day: 1.50    Years: 7.00    Pack years: 10.50    Types: Cigarettes    Quit date: 12/04/2006    Years since quitting: 12.8  . Smokeless tobacco: Never Used  . Tobacco comment: quit 2 or more years  Substance and Sexual Activity  . Alcohol use: No    Alcohol/week: 0.0 standard drinks  . Drug use: Yes    Types: Marijuana    Comment: last smoke a "few days ago"  . Sexual activity: Yes    Partners: Female    Birth control/protection: None  Lifestyle  . Physical activity    Days per week: Not on file    Minutes per session: Not on file  . Stress: Not on file  Relationships  . Social Herbalist on phone: Not on file    Gets together: Not on file    Attends religious service: Not on file    Active member of club or organization: Not on file    Attends meetings of clubs or organizations: Not on file    Relationship status: Not on file  Other Topics Concern  . Not on file  Social History  Narrative   HSG, attending [employer] (09/2010). Married June 2011.No children. NO history of physical or sexual abuse. Not working at this time.             Allergies  Allergen Reactions  . Avelox [Moxifloxacin Hcl In Nacl] Nausea And Vomiting  . Fluoxetine     Intolerant of all SSRI's, NE drugs  . Other Nausea And Vomiting    Strong antibiotics by mouth / antidepressants  . Moxifloxacin Nausea And Vomiting   Family History  Problem Relation Age of Onset  . Depression Mother   . Anxiety disorder Mother   . Alcohol abuse Mother   . Hypertension Father   . Hyperlipidemia Father   . Colon polyps Paternal Grandfather   . Cancer Paternal Aunt        breast  . Colon cancer Neg Hx          Current Outpatient Medications (Other):  .  cyclobenzaprine (FLEXERIL) 10 MG tablet, TAKE 1 TABLET BY MOUTH THREE TIMES A DAY AS NEEDED FOR MUSCLE SPASMS .  hydrOXYzine (VISTARIL) 25 MG  capsule, Take 75 mg by mouth at bedtime as needed for anxiety.  .  Vitamin D, Ergocalciferol, (DRISDOL) 1.25 MG (50000 UT) CAPS capsule, TAKE 1 CAPSULE (50,000 UNITS TOTAL) BY MOUTH EVERY 7 (SEVEN) DAYS. Marland Kitchen  cyclobenzaprine (FLEXERIL) 5 MG tablet, TAKE 1 TABLET BY MOUTH 3 TIMES DAILY AS NEEDED FOR MUSCLE SPASMS    Past medical history, social, surgical and family history all reviewed in electronic medical record.  No pertanent information unless stated regarding to the chief complaint.   Review of Systems:  No headache, visual changes, nausea, vomiting, diarrhea, constipation, dizziness, abdominal pain, skin rash, fevers, chills, night sweats, weight loss, swollen lymph nodes, body aches, joint swelling,  chest pain, shortness of breath, mood changes.  Positive muscle aches  Objective  Blood pressure 110/72, pulse (!) 103, height 6\' 1"  (1.854 m), weight 185 lb (83.9 kg), SpO2 98 %.    General: No apparent distress alert and oriented x3 mood and affect normal, dressed appropriately.  HEENT: Pupils equal, extraocular movements intact  Respiratory: Patient's speak in full sentences and does not appear short of breath  Cardiovascular: No lower extremity edema, non tender, no erythema  Skin: Warm dry intact with no signs of infection or rash on extremities or on axial skeleton.  Abdomen: Soft nontender  Neuro: Cranial nerves II through XII are intact, neurovascularly intact in all extremities with 2+ DTRs and 2+ pulses.  Lymph: No lymphadenopathy of posterior or anterior cervical chain or axillae bilaterally.  Gait normal with good balance and coordination.  MSK:  Non tender with full range of motion and good stability and symmetric strength and tone of  elbows, wrist, hip, knee and ankles bilaterally.  Right shoulder exam still has positive crossover.  Positive O'Brien's as well.  Rotator cuff strength 5 out of 5.  Procedure: Real-time Ultrasound Guided Injection of right acromioclavicular  joint Device: GE Logiq Q7 Ultrasound guided injection is preferred based studies that show increased duration, increased effect, greater accuracy, decreased procedural pain, increased response rate, and decreased cost with ultrasound guided versus blind injection.  Verbal informed consent obtained.  Time-out conducted.  Noted no overlying erythema, induration, or other signs of local infection.  Skin prepped in a sterile fashion.  Local anesthesia: Topical Ethyl chloride.  With sterile technique and under real time ultrasound guidance: With a 25-gauge half inch needle injected with 0.5 cc of 0.5% Marcaine and  0.5 cc of Kenalog 40 mg/mL Completed without difficulty  Pain immediately resolved suggesting accurate placement of the medication.  Advised to call if fevers/chills, erythema, induration, drainage, or persistent bleeding.  Images permanently stored and available for review in the ultrasound unit.  Impression: Technically successful ultrasound guided injection.    Impression and Recommendations:     This case required medical decision making of moderate complexity. The above documentation has been reviewed and is accurate and complete [name], DO       Note: This dictation was prepared with Dragon dictation along with smaller phrase technology. Any transcriptional errors that result from this process are unintentional.

## 2019-10-10 NOTE — Telephone Encounter (Signed)
Left two messages for patient to call regarding this message, pt did not respond. Spoke with patient at length this morning prior to his in person appt with Dr. Tamala Julian. Explained how the statements he made could be viewed as threatening and any repeat behavior of this manner could result in dismissal from [facility]. Empathized with patient's anxiety surrounding COVID, expressed our need for patients to understand their role in keeping other patients and staff safe. Informed patient that MyChart is a choice, not meant to cause additional stress. Highly recommended that he consider disabling his MyChart account.

## 2019-10-10 NOTE — Patient Instructions (Addendum)
Injected AC joint today  Give me an update in 3 weeks

## 2019-11-24 ENCOUNTER — Other Ambulatory Visit: Payer: Self-pay | Admitting: Family Medicine

## 2020-01-02 ENCOUNTER — Other Ambulatory Visit: Payer: Self-pay | Admitting: Family Medicine

## 2020-02-11 ENCOUNTER — Other Ambulatory Visit: Payer: Self-pay | Admitting: Family Medicine

## 2020-02-24 ENCOUNTER — Telehealth: Payer: Self-pay | Admitting: Internal Medicine

## 2020-02-24 MED ORDER — METRONIDAZOLE 250 MG PO TABS: 250.0000 mg | ORAL_TABLET | Freq: Three times a day (TID) | ORAL | 0 refills | Status: DC

## 2020-02-24 MED ORDER — CIPROFLOXACIN HCL 500 MG PO TABS: 500.0000 mg | ORAL_TABLET | Freq: Two times a day (BID) | ORAL | 0 refills | Status: DC

## 2020-02-24 NOTE — Telephone Encounter (Signed)
This certainly could be diverticulitis, which I am okay to begin empiric therapy, but he needs to be seen  APP visit next available  Cipro 500 mg BID, flagyl 250 mg TID x 7 days Low residue diet with focus on hydration which pain is present If worsening before he can be seen, then ER eval is recommended

## 2020-02-24 NOTE — Telephone Encounter (Signed)
Spoke with pts wife and she is aware. Pt scheduled to see Ellouise Newer PA 02/27/20@1 :30pm, wife aware of appt and scripts sent to pharmacy.

## 2020-02-24 NOTE — Telephone Encounter (Signed)
Pt started having intense LLQ pain yesterday am. No fever, he is able to sleep. Wife states he is walking hunched over due to the pain. Last colon did show some diverticulosis. Pt very anxious about covid, does not want to come in office due to covid. Please advise.

## 2020-02-25 ENCOUNTER — Other Ambulatory Visit: Payer: Self-pay

## 2020-02-25 MED ORDER — HYOSCYAMINE SULFATE 0.125 MG SL SUBL: 0.1250 mg | SUBLINGUAL_TABLET | SUBLINGUAL | 0 refills | Status: DC | PRN

## 2020-02-25 NOTE — Telephone Encounter (Signed)
See other note, with info as I got disconnected during the response   Ok for Levsin SL 0.125 mg every 4-6 hours PRN abd pain and spasm  Once he is vaccinated in person appt is needed

## 2020-02-25 NOTE — Telephone Encounter (Signed)
Difficult situation when abd pain is the complaint and no examination is performed. That said, I understand his anxiety regarding the pandemic and we want to be accommodating when safe and possible. Would schedule a virtual visit with APP, with the knowledge to pt and wife that if after this visit we feel he needs to be examined then we would hope he would come in. [id number]

## 2020-02-27 ENCOUNTER — Ambulatory Visit: Payer: BC Managed Care – PPO | Admitting: Physician Assistant

## 2020-04-05 ENCOUNTER — Encounter: Payer: Self-pay | Admitting: *Deleted

## 2020-04-07 ENCOUNTER — Ambulatory Visit: Payer: 59 | Admitting: Internal Medicine

## 2020-04-07 ENCOUNTER — Encounter: Payer: Self-pay | Admitting: Internal Medicine

## 2020-04-07 VITALS — BP 120/78 | HR 98 | Temp 97.9°F | Ht 73.0 in | Wt 195.1 lb

## 2020-04-07 DIAGNOSIS — Z8719 Personal history of other diseases of the digestive system: Secondary | ICD-10-CM | POA: Diagnosis not present

## 2020-04-07 DIAGNOSIS — Z86010 Personal history of colonic polyps: Secondary | ICD-10-CM

## 2020-04-07 MED ORDER — SUTAB 1479-225-188 MG PO TABS: 1.0000 | ORAL_TABLET | ORAL | 0 refills | Status: DC

## 2020-04-07 NOTE — Progress Notes (Signed)
Patient ID: [name], male   DOB: 20-Mar-1981, 39 y.o.   MRN: [id number] HPI: [name] is a 39 year old male with a history of adenomatous colon polyp, colonic diverticulosis, recent presumed diverticulitis treated with antibiotics in March 2021, history of anxiety and depression who is seen for continuity.  He is here today with his wife.  He is known to me from 2016 when I saw him after a bout of diverticulitis which was diagnosed by CT scan.  I performed a colonoscopy on 09/21/2015.  3 polyps ranging from 3 to 6 mm in size were found in the cecum, sigmoid and rectosigmoid colon.  These were removed with cold snare.  There was mild diverticulosis in the descending and sigmoid colon.  1 of these polyps was adenomatous the other 2 hyperplastic.  He reports that he has had considerable issues with anxiety during the COVID-19 pandemic.  However he was vaccinated successfully with the [place] vaccine and this has led to some improvement in his anxiousness.  Fortunately he did not contract COVID-19.  He developed in mid to late March left lower quadrant abdominal pain reminiscent of prior diverticulitis pain.  This was a fairly constant though progressive pain until antibiotics were started.  He recalls being somewhat constipated in the days preceding the pain.  He did not have blood in stool or melena.  Does not recall fevers.  We treated him with ciprofloxacin and metronidazole for 7 days.  I recommended a low residue diet and we also called and dicyclomine for crampy discomfort.  He reports that within a few days of the antibiotics his pain improved significantly and has resolved entirely by the end of his antibiotic therapy.  It has not recurred.  He is having no pain today.  Bowel movements have been more regular with increased hydration and reduced use of medications he uses for sleep, specifically hydroxyzine, cyclobenzaprine.  He is not working currently.  He is a past smoker.  No recent  alcohol.  Past Medical History:  Diagnosis Date  . Alcohol abuse   . Anxiety   . Anxiety and depression   . Benzodiazepine dependence ([place])   . Depression   . Difficulty urinating    cant urinate in public places (restrooms)  . Diverticulitis   . Opioid dependence ([place])   . Pilonidal cyst   . Seasonal allergies   . Skull fracture ([place]) 2004   Trauma  . Tension headache   . Tubular adenoma of colon     Past Surgical History:  Procedure Laterality Date  . ORIF right 3rd finger  2012   Dr. Fredna Dow - see hospital notes.  . pilonidal cystectomy  08/01/2011  . RADIOFREQUENCY ABLATION NERVES  12/10/2012  . VASECTOMY  04/2010  . WISDOM TOOTH EXTRACTION      Outpatient Medications Prior to Visit  Medication Sig Dispense Refill  . chlordiazePOXIDE (LIBRIUM) 25 MG capsule Take 25 mg by mouth daily.    . clonazePAM (KLONOPIN) 1 MG tablet Take 1-2 mg by mouth 3 (three) times daily.    . cyclobenzaprine (FLEXERIL) 10 MG tablet TAKE 1 TABLET BY MOUTH THREE TIMES A DAY AS NEEDED FOR MUSCLE SPASMS 90 tablet 3  . Dextromethorphan-quiNIDine (NUEDEXTA) 20-10 MG capsule Take 1 capsule by mouth 2 (two) times daily.    . hydrOXYzine (VISTARIL) 25 MG capsule Take 75 mg by mouth at bedtime as needed for anxiety.     . hyoscyamine (LEVSIN SL) 0.125 MG SL tablet Place 1 tablet (  0.125 mg total) under the tongue every 4 (four) hours as needed. 30 tablet 0  . Vitamin D, Ergocalciferol, (DRISDOL) 1.25 MG (50000 UNIT) CAPS capsule TAKE 1 CAPSULE (50,000 UNITS TOTAL) BY MOUTH EVERY 7 (SEVEN) DAYS. 12 capsule 0  . cyclobenzaprine (FLEXERIL) 5 MG tablet TAKE 1 TABLET BY MOUTH 3 TIMES DAILY AS NEEDED FOR MUSCLE SPASMS 270 tablet 0  . ciprofloxacin (CIPRO) 500 MG tablet Take 1 tablet (500 mg total) by mouth 2 (two) times daily. (Patient not taking: Reported on 04/07/2020) 14 tablet 0  . metroNIDAZOLE (FLAGYL) 250 MG tablet Take 1 tablet (250 mg total) by mouth 3 (three) times daily. (Patient not taking: Reported on  04/07/2020) 21 tablet 0   No facility-administered medications prior to visit.    Allergies  Allergen Reactions  . Avelox [Moxifloxacin Hcl In Nacl] Nausea And Vomiting  . Fluoxetine     Intolerant of all SSRI's, NE drugs  . Nsaids Other (See Comments)  . Other Nausea And Vomiting    Strong antibiotics by mouth / antidepressants  . Moxifloxacin Nausea And Vomiting    Family History  Problem Relation Age of Onset  . Depression Mother   . Anxiety disorder Mother   . Alcohol abuse Mother   . Hypertension Father   . Hyperlipidemia Father   . Colon polyps Paternal Grandfather   . Cancer Paternal Aunt        breast  . Breast cancer Paternal Aunt   . Colon cancer Neg Hx   . Esophageal cancer Neg Hx   . Pancreatic cancer Neg Hx   . Stomach cancer Neg Hx     Social History   Tobacco Use  . Smoking status: Former Smoker    Packs/day: 1.50    Years: 7.00    Pack years: 10.50    Types: Cigarettes    Quit date: 12/04/2006    Years since quitting: 13.3  . Smokeless tobacco: Never Used  . Tobacco comment: quit 2 or more years  Substance Use Topics  . Alcohol use: No    Alcohol/week: 0.0 standard drinks  . Drug use: Yes    Types: Marijuana    Comment: last smoke a "few days ago"    ROS: As per history of present illness, otherwise negative  BP 120/78 (BP Location: Left Arm, Patient Position: Sitting, Cuff Size: Normal)   Pulse 98   Temp 97.9 F (36.6 C)   Ht 6\' 1"  (1.854 m)   Wt 195 lb 2 oz (88.5 kg)   SpO2 97%   BMI 25.74 kg/m  Gen: awake, alert, NAD HEENT: anicteric CV: RRR, no mrg Pulm: CTA b/l Abd: soft, NT/ND, +BS throughout Ext: no c/c/e Neuro: nonfocal  ASSESSMENT/PLAN: 39 year old male with a history of adenomatous colon polyp, colonic diverticulosis, recent presumed diverticulitis treated with antibiotics in March 2021, history of anxiety and depression who is seen for continuity.  1.  Recent diverticulitis --pain consistent with diverticulitis and he  responded appropriately to ciprofloxacin and metronidazole therapy.  His episode has resolved entirely.  We discussed preventative measures, for which we do not have solid data, however avoiding constipation would likely help prevent future attacks.  I recommended that he resume a more high-fiber diet as opposed to low residue, focus on staying hydrated by drinking water on a regular basis and consider MiraLAX 17 g daily if needed for constipation.  He is asked to call us if he has recurrent diverticulitis type symptoms.  2.  History of adenoma  of the colon 2016 --he had an adenomatous polyp at a young age, age 78.  I recommended that we repeat colonoscopy for surveillance this year.  We discussed the risk, benefits and alternatives and he is agreeable and wishes to proceed.  He is agreeable to proceeding in July and we will schedule this today with SuTab prep. --Colonoscopy in the [facility]      [id number], [address], [place],  [place] [address]

## 2020-04-07 NOTE — Patient Instructions (Signed)
You have been scheduled for a colonoscopy. Please follow written instructions given to you at your visit today.  Please pick up your prep supplies at the pharmacy within the next 1-3 days. If you use inhalers (even only as needed), please bring them with you on the day of your procedure. Your physician has requested that you go to www.startemmi.com and enter the access code given to you at your visit today. This web site gives a general overview about your procedure. However, you should still follow specific instructions given to you by our office regarding your preparation for the procedure. _________________________________________ Please purchase the following medications over the counter and take as directed: Miralax 1 capful (17 grams) dissolved in at least 8 ounces water/juice once daily for constipation if needed _________________________________________ Dennis Bast may liberalize your diet to include more fiber. _________________________________________ Make sure you get plenty of fluids! __________________________________________ If you are age 53 or older, your body mass index should be between 23-30. Your Body mass index is 25.74 kg/m. If this is out of the aforementioned range listed, please consider follow up with your Primary Care Provider.  If you are age 29 or younger, your body mass index should be between 19-25. Your Body mass index is 25.74 kg/m. If this is out of the aformentioned range listed, please consider follow up with your Primary Care Provider.  ___________________________________________ Due to recent changes in healthcare laws, you may see the results of your imaging and laboratory studies on MyChart before your provider has had a chance to review them.  We understand that in some cases there may be results that are confusing or concerning to you. Not all laboratory results come back in the same time frame and the provider may be waiting for multiple results in order to  interpret others.  Please give [place] 48 hours in order for your provider to thoroughly review all the results before contacting the office for clarification of your results.

## 2020-05-08 ENCOUNTER — Other Ambulatory Visit: Payer: Self-pay | Admitting: Family Medicine

## 2020-06-09 ENCOUNTER — Encounter: Payer: Self-pay | Admitting: Internal Medicine

## 2020-06-14 ENCOUNTER — Encounter: Payer: Self-pay | Admitting: Internal Medicine

## 2020-06-14 ENCOUNTER — Ambulatory Visit (AMBULATORY_SURGERY_CENTER): Payer: No Typology Code available for payment source | Admitting: Internal Medicine

## 2020-06-14 ENCOUNTER — Other Ambulatory Visit: Payer: Self-pay

## 2020-06-14 VITALS — BP 98/60 | HR 71 | Temp 98.7°F | Resp 19 | Ht 73.0 in | Wt 195.0 lb

## 2020-06-14 DIAGNOSIS — Z86010 Personal history of colonic polyps: Secondary | ICD-10-CM | POA: Diagnosis not present

## 2020-06-14 DIAGNOSIS — Z860101 Personal history of adenomatous and serrated colon polyps: Secondary | ICD-10-CM

## 2020-06-14 MED ORDER — SODIUM CHLORIDE 0.9 % IV SOLN
500.0000 mL | Freq: Once | INTRAVENOUS | Status: DC
Start: 2020-06-14 — End: 2020-06-14

## 2020-06-14 NOTE — Progress Notes (Signed)
PT taken to PACU. Monitors in place. VSS. Report given to RN. 

## 2020-06-14 NOTE — Patient Instructions (Signed)
Handout provided on diverticulosis.   YOU HAD AN ENDOSCOPIC PROCEDURE TODAY AT THE [facility]:   Refer to the procedure report that was given to you for any specific questions about what was found during the examination.  If the procedure report does not answer your questions, please call your gastroenterologist to clarify.  If you requested that your care partner not be given the details of your procedure findings, then the procedure report has been included in a sealed envelope for you to review at your convenience later.  YOU SHOULD EXPECT: Some feelings of bloating in the abdomen. Passage of more gas than usual.  Walking can help get rid of the air that was put into your GI tract during the procedure and reduce the bloating. If you had a lower endoscopy (such as a colonoscopy or flexible sigmoidoscopy) you may notice spotting of blood in your stool or on the toilet paper. If you underwent a bowel prep for your procedure, you may not have a normal bowel movement for a few days.  Please Note:  You might notice some irritation and congestion in your nose or some drainage.  This is from the oxygen used during your procedure.  There is no need for concern and it should clear up in a day or so.  SYMPTOMS TO REPORT IMMEDIATELY:  Following lower endoscopy (colonoscopy or flexible sigmoidoscopy):  Excessive amounts of blood in the stool  Significant tenderness or worsening of abdominal pains  Swelling of the abdomen that is new, acute  Fever of 100F or higher  For urgent or emergent issues, a gastroenterologist can be reached at any hour by calling [phone]. Do not use MyChart messaging for urgent concerns.    DIET:  We do recommend a small meal at first, but then you may proceed to your regular diet.  Drink plenty of fluids but you should avoid alcoholic beverages for 24 hours.  ACTIVITY:  You should plan to take it easy for the rest of today and you should NOT DRIVE or use  heavy machinery until tomorrow (because of the sedation medicines used during the test).    FOLLOW UP: Our staff will call the number listed on your records 48-72 hours following your procedure to check on you and address any questions or concerns that you may have regarding the information given to you following your procedure. If we do not reach you, we will leave a message.  We will attempt to reach you two times.  During this call, we will ask if you have developed any symptoms of COVID 19. If you develop any symptoms (ie: fever, flu-like symptoms, shortness of breath, cough etc.) before then, please call [phone].  If you test positive for Covid 19 in the 2 weeks post procedure, please call and report this information to us.    If any biopsies were taken you will be contacted by phone or by letter within the next 1-3 weeks.  Please call us at [phone] if you have not heard about the biopsies in 3 weeks.    SIGNATURES/CONFIDENTIALITY: You and/or your care partner have signed paperwork which will be entered into your electronic medical record.  These signatures attest to the fact that that the information above on your After Visit Summary has been reviewed and is understood.  Full responsibility of the confidentiality of this discharge information lies with you and/or your care-partner.  

## 2020-06-14 NOTE — Progress Notes (Signed)
Patient last smoked marijuana at 5:15am today.  CRNA informed.

## 2020-06-14 NOTE — Op Note (Signed)
[facility] Patient Name: [name] Procedure Date: 06/14/2020 8:39 AM MRN: [id number] Endoscopist: Jerene Bears , MD Age: 39 Referring MD:  Date of Birth: Jan 04, 1981 Gender: Male Account #: [id number] Procedure:                Colonoscopy Indications:              High risk colon cancer surveillance: Personal                            history of non-advanced adenoma, Last colonoscopy:                            October 2016, history of diverticulitis (last March                            2021) Medicines:                Monitored Anesthesia Care Procedure:                Pre-Anesthesia Assessment:                           - Prior to the procedure, a History and Physical                            was performed, and patient medications and                            allergies were reviewed. The patient's tolerance of                            previous anesthesia was also reviewed. The risks                            and benefits of the procedure and the sedation                            options and risks were discussed with the patient.                            All questions were answered, and informed consent                            was obtained. Prior Anticoagulants: The patient has                            taken no previous anticoagulant or antiplatelet                            agents. ASA Grade Assessment: II - A patient with                            mild systemic disease. After reviewing the risks  and benefits, the patient was deemed in                            satisfactory condition to undergo the procedure.                           After obtaining informed consent, the colonoscope                            was passed under direct vision. Throughout the                            procedure, the patient's blood pressure, pulse, and                            oxygen saturations were monitored continuously. The                             Colonoscope was introduced through the anus and                            advanced to the cecum, identified by appendiceal                            orifice and ileocecal valve. The colonoscopy was                            performed without difficulty. The patient tolerated                            the procedure well. The quality of the bowel                            preparation was good. The ileocecal valve,                            appendiceal orifice, and rectum were photographed. Scope In: 8:48:03 AM Scope Out: 9:01:25 AM Scope Withdrawal Time: 0 hours 10 minutes 38 seconds  Total Procedure Duration: 0 hours 13 minutes 22 seconds  Findings:                 The digital rectal exam was normal.                           Multiple small and large-mouthed diverticula were                            found in the sigmoid colon and ascending colon.                            Over diverticulosis is mild.                           The exam was otherwise without abnormality on  direct and retroflexion views. Complications:            No immediate complications. Estimated Blood Loss:     Estimated blood loss: none. Impression:               - Diverticulosis in the sigmoid colon and in the                            ascending colon.                           - The examination was otherwise normal on direct                            and retroflexion views.                           - No specimens collected. Recommendation:           - Patient has a contact number available for                            emergencies. The signs and symptoms of potential                            delayed complications were discussed with the                            patient. Return to normal activities tomorrow.                            Written discharge instructions were provided to the                            patient.                           - Resume  previous diet.                           - Continue present medications.                           - Repeat colonoscopy for surveillance at age 76. Jerene Bears, MD 06/14/2020 9:05:32 AM This report has been signed electronically.

## 2020-06-16 ENCOUNTER — Telehealth: Payer: Self-pay

## 2020-06-16 NOTE — Telephone Encounter (Signed)
  Follow up Call-  Call back number 06/14/2020  Post procedure Call Back phone  # [phone]  Permission to leave phone message Yes  Some recent data might be hidden     Patient questions:  Do you have a fever, pain , or abdominal swelling? No. Pain Score  0 *  Have you tolerated food without any problems? Yes.    Have you been able to return to your normal activities? Yes.    Do you have any questions about your discharge instructions: Diet   No. Medications  No. Follow up visit  No.  Do you have questions or concerns about your Care? No.  Actions: * If pain score is 4 or above: No action needed, pain <4.  1. Have you developed a fever since your procedure? no  2.   Have you had an respiratory symptoms (SOB or cough) since your procedure? no  3.   Have you tested positive for COVID 19 since your procedure no  4.   Have you had any family members/close contacts diagnosed with the COVID 19 since your procedure?  no   If yes to any of these questions please route to Joylene Jaymari, RN and Erenest Rasher, RN

## 2020-06-17 ENCOUNTER — Telehealth: Payer: Self-pay

## 2020-06-17 NOTE — Telephone Encounter (Signed)
New message    Last seen in 2017    His wife is asking can Dr. Sharlet Salina consider seeing him again.

## 2020-06-17 NOTE — Telephone Encounter (Signed)
F/u  Appt on 7.16.2021 @ 8:40am

## 2020-06-17 NOTE — Telephone Encounter (Signed)
Fine to re-establish

## 2020-06-18 ENCOUNTER — Ambulatory Visit: Payer: No Typology Code available for payment source | Admitting: Internal Medicine

## 2020-06-29 ENCOUNTER — Ambulatory Visit: Payer: No Typology Code available for payment source | Admitting: Internal Medicine

## 2020-08-02 ENCOUNTER — Other Ambulatory Visit: Payer: Self-pay | Admitting: Family Medicine

## 2020-08-23 ENCOUNTER — Encounter: Payer: Self-pay | Admitting: Family Medicine

## 2020-08-26 ENCOUNTER — Encounter: Payer: Self-pay | Admitting: Internal Medicine

## 2020-09-02 DIAGNOSIS — E7849 Other hyperlipidemia: Secondary | ICD-10-CM | POA: Insufficient documentation

## 2020-09-02 DIAGNOSIS — R7989 Other specified abnormal findings of blood chemistry: Secondary | ICD-10-CM | POA: Insufficient documentation

## 2020-09-02 DIAGNOSIS — R739 Hyperglycemia, unspecified: Secondary | ICD-10-CM | POA: Insufficient documentation

## 2020-09-02 DIAGNOSIS — D72829 Elevated white blood cell count, unspecified: Secondary | ICD-10-CM | POA: Insufficient documentation

## 2020-09-02 NOTE — Progress Notes (Deleted)
Subjective:    Patient ID: [name], male    DOB: 07-Jan-1981, 39 y.o.   MRN: [id number]  HPI The patient is here for follow up of their chronic medical problems.   He had blood work done recently by Dr Toy Care, who was concerned about his WBC, testosterone and cholesterol.  He was not fasting.  Testosterone 107 ng/dL Cholesterol, Total 308 mg/dL Triglycerides 727 mg/dL HDL Cholesterol 24mg /dL VLDL Cholesterol Cal 142 mg/dL LDL Chol Calc (NIH) 142 mg/DL WBC 14.2x10E3/uL   Medications and allergies reviewed with patient and updated if appropriate.  Patient Active Problem List   Diagnosis Date Noted  . AC joint pain 10/10/2019  . Labral tear of shoulder, right, initial encounter 09/05/2019  . Bursitis of right shoulder 08/14/2019  . Scapular dyskinesis 10/09/2018  . Rib pain on left side 09/13/2016  . Dissociative episodes 09/07/2016  . Plica syndrome of left knee 07/25/2016  . Spondylolisthesis at L5-S1 level 08/04/2014  . Opioid dependence ([place]) 06/23/2014  . Benzodiazepine dependence ([place]) 06/22/2014  . Depression 06/22/2014  . Anxiety 06/22/2014  . Substance induced mood disorder ([place]) 06/22/2014  . Pain management 10/09/2012  . PILONIDAL CYST 01/23/2011  . PARESTHESIA 12/22/2010  . Acute lumbar back pain 09/30/2010  . HEADACHE, TENSION 09/29/2010  . ALCOHOL ABUSE, HX OF 09/29/2010    Current Outpatient Medications on File Prior to Visit  Medication Sig Dispense Refill  . chlordiazePOXIDE (LIBRIUM) 25 MG capsule Take 25 mg by mouth daily.    . clonazePAM (KLONOPIN) 1 MG tablet Take 1-2 mg by mouth 3 (three) times daily.    . cyclobenzaprine (FLEXERIL) 10 MG tablet TAKE 1 TABLET BY MOUTH THREE TIMES A DAY AS NEEDED FOR MUSCLE SPASMS 90 tablet 3  . Dextromethorphan-quiNIDine (NUEDEXTA) 20-10 MG capsule Take 1 capsule by mouth 2 (two) times daily.    . hydrOXYzine (VISTARIL) 25 MG capsule Take 75 mg by mouth at bedtime as needed for anxiety.     . hyoscyamine  (LEVSIN SL) 0.125 MG SL tablet Place 1 tablet (0.125 mg total) under the tongue every 4 (four) hours as needed. 30 tablet 0  . Vitamin D, Ergocalciferol, (DRISDOL) 1.25 MG (50000 UNIT) CAPS capsule TAKE 1 CAPSULE (50,000 UNITS TOTAL) BY MOUTH EVERY 7 (SEVEN) DAYS. 12 capsule 0   No current facility-administered medications on file prior to visit.    Past Medical History:  Diagnosis Date  . Alcohol abuse   . Allergy   . Anxiety   . Anxiety and depression   . Benzodiazepine dependence ([place])   . Depression   . Difficulty urinating    cant urinate in public places (restrooms)  . Diverticulitis   . Opioid dependence ([place])   . Pilonidal cyst   . Seasonal allergies   . Skull fracture ([place]) 2004   Trauma  . Tension headache   . Tubular adenoma of colon     Past Surgical History:  Procedure Laterality Date  . ORIF right 3rd finger  2012   Dr. Fredna Dow - see hospital notes.  . pilonidal cystectomy  08/01/2011  . RADIOFREQUENCY ABLATION NERVES  12/10/2012  . VASECTOMY  04/2010  . WISDOM TOOTH EXTRACTION      Social History   Socioeconomic History  . Marital status: Married    Spouse name: Not on file  . Number of children: 0  . Years of education: 28  . Highest education level: Not on file  Occupational History  . Occupation: unemployed  Tobacco  Use  . Smoking status: Former Smoker    Packs/day: 1.50    Years: 7.00    Pack years: 10.50    Types: Cigarettes    Quit date: 12/04/2006    Years since quitting: 13.7  . Smokeless tobacco: Never Used  . Tobacco comment: quit 2 or more years  Vaping Use  . Vaping Use: Every day  . Substances: Nicotine  Substance and Sexual Activity  . Alcohol use: No    Alcohol/week: 0.0 standard drinks  . Drug use: Yes    Types: Marijuana    Comment: last smoke a "few days ago"  . Sexual activity: Yes    Partners: Female    Birth control/protection: None  Other Topics Concern  . Not on file  Social History Narrative   HSG, attending [employer]  (09/2010). Married June 2011.No children. NO history of physical or sexual abuse. Not working at this time.             Social Determinants of Health   Financial Resource Strain:   . Difficulty of Paying Living Expenses: Not on file  Food Insecurity:   . Worried About Charity fundraiser in the Last Year: Not on file  . Ran Out of Food in the Last Year: Not on file  Transportation Needs:   . Lack of Transportation (Medical): Not on file  . Lack of Transportation (Non-Medical): Not on file  Physical Activity:   . Days of Exercise per Week: Not on file  . Minutes of Exercise per Session: Not on file  Stress:   . Feeling of Stress : Not on file  Social Connections:   . Frequency of Communication with Friends and Family: Not on file  . Frequency of Social Gatherings with Friends and Family: Not on file  . Attends Religious Services: Not on file  . Active Member of Clubs or Organizations: Not on file  . Attends Archivist Meetings: Not on file  . Marital Status: Not on file    Family History  Problem Relation Age of Onset  . Depression Mother   . Anxiety disorder Mother   . Alcohol abuse Mother   . Hypertension Father   . Hyperlipidemia Father   . Colon polyps Paternal Grandfather   . Cancer Paternal Aunt        breast  . Breast cancer Paternal Aunt   . Colon cancer Neg Hx   . Esophageal cancer Neg Hx   . Pancreatic cancer Neg Hx   . Stomach cancer Neg Hx   . Rectal cancer Neg Hx     Review of Systems     Objective:  There were no vitals filed for this visit. BP Readings from Last 3 Encounters:  06/14/20 98/60  04/07/20 120/78  10/10/19 110/72   Wt Readings from Last 3 Encounters:  06/14/20 195 lb (88.5 kg)  04/07/20 195 lb 2 oz (88.5 kg)  10/10/19 185 lb (83.9 kg)   There is no height or weight on file to calculate BMI.   Physical Exam    Constitutional: Appears well-developed and well-nourished. No distress.  HENT:  Head: Normocephalic and  atraumatic.  Neck: Neck supple. No tracheal deviation present. No thyromegaly present.  No cervical lymphadenopathy Cardiovascular: Normal rate, regular rhythm and normal heart sounds.   No murmur heard. No carotid bruit .  No edema Pulmonary/Chest: Effort normal and breath sounds normal. No respiratory distress. No has no wheezes. No rales.  Skin: Skin is warm  and dry. Not diaphoretic.  Psychiatric: Normal mood and affect. Behavior is normal.      Assessment & Plan:    See Problem List for Assessment and Plan of chronic medical problems.    This visit occurred during the SARS-CoV-2 public health emergency.  Safety protocols were in place, including screening questions prior to the visit, additional usage of staff PPE, and extensive cleaning of exam room while observing appropriate contact time as indicated for disinfecting solutions.

## 2020-09-02 NOTE — Patient Instructions (Signed)
  Blood work was ordered.     Medications reviewed and updated.  Changes include :      A referral was ordered for        Someone from their office will call you to schedule an appointment.

## 2020-09-03 ENCOUNTER — Encounter: Payer: No Typology Code available for payment source | Admitting: Internal Medicine

## 2020-09-03 ENCOUNTER — Other Ambulatory Visit: Payer: Self-pay

## 2020-09-03 ENCOUNTER — Encounter: Payer: Self-pay | Admitting: Internal Medicine

## 2020-09-03 NOTE — Progress Notes (Signed)
Virtual Visit via Video Note  I connected with [name] on 09/03/20 at  8:30 AM EDT by a video enabled telemedicine application and verified that I am speaking with the correct person using two identifiers.   I discussed the limitations of evaluation and management by telemedicine and the availability of in person appointments. The patient expressed understanding and agreed to proceed.  Present for the visit:  Myself, Dr Billey Gosling, [name].  The patient is currently at home and I am in the office.    No referring provider.    History of Present Illness: He had blood work done recently by Dr Toy Care, who was concerned about his WBC, testosterone and cholesterol.  He was not fasting.  Testosterone 107 ng/dL Cholesterol, Total 308 mg/dL Triglycerides 727 mg/dL HDL Cholesterol 24mg /dL VLDL Cholesterol Cal 142 mg/dL LDL Chol Calc (NIH) 142 mg/DL WBC 14.2x10E3/uL     ROS      Social History   Socioeconomic History  . Marital status: Married    Spouse name: Not on file  . Number of children: 0  . Years of education: 22  . Highest education level: Not on file  Occupational History  . Occupation: unemployed  Tobacco Use  . Smoking status: Former Smoker    Packs/day: 1.50    Years: 7.00    Pack years: 10.50    Types: Cigarettes    Quit date: 12/04/2006    Years since quitting: 13.7  . Smokeless tobacco: Never Used  . Tobacco comment: quit 2 or more years  Vaping Use  . Vaping Use: Every day  . Substances: Nicotine  Substance and Sexual Activity  . Alcohol use: No    Alcohol/week: 0.0 standard drinks  . Drug use: Yes    Types: Marijuana    Comment: last smoke a "few days ago"  . Sexual activity: Yes    Partners: Female    Birth control/protection: None  Other Topics Concern  . Not on file  Social History Narrative   HSG, attending [employer] (09/2010). Married June 2011.No children. NO history of physical or sexual abuse. Not working at this time.              Social Determinants of Health   Financial Resource Strain:   . Difficulty of Paying Living Expenses: Not on file  Food Insecurity:   . Worried About Charity fundraiser in the Last Year: Not on file  . Ran Out of Food in the Last Year: Not on file  Transportation Needs:   . Lack of Transportation (Medical): Not on file  . Lack of Transportation (Non-Medical): Not on file  Physical Activity:   . Days of Exercise per Week: Not on file  . Minutes of Exercise per Session: Not on file  Stress:   . Feeling of Stress : Not on file  Social Connections:   . Frequency of Communication with Friends and Family: Not on file  . Frequency of Social Gatherings with Friends and Family: Not on file  . Attends Religious Services: Not on file  . Active Member of Clubs or Organizations: Not on file  . Attends Archivist Meetings: Not on file  . Marital Status: Not on file     Observations/Objective: Appears well in NAD Breathing normally skin appears warm and dry  Assessment and Plan:  See Problem List for Assessment and Plan of chronic medical problems.   Follow Up Instructions:    I discussed the assessment  and treatment plan with the patient. The patient was provided an opportunity to ask questions and all were answered. The patient agreed with the plan and demonstrated an understanding of the instructions.   The patient was advised to call back or seek an in-person evaluation if the symptoms worsen or if the condition fails to improve as anticipated.    Binnie Rail, MD  This encounter was created in error - please disregard.

## 2020-10-14 ENCOUNTER — Other Ambulatory Visit: Payer: Self-pay | Admitting: Family Medicine

## 2020-10-15 NOTE — Telephone Encounter (Signed)
Patient is scheduled for virtual visit

## 2020-10-19 NOTE — Progress Notes (Signed)
Virtual Visit via Video Note  I connected with [name] on 10/20/20 at  7:30 AM EST by a video enabled telemedicine application and verified that I am speaking with the correct person using two identifiers. Patient is alone on the phone call Location: Patient: In home setting Provider: In office setting   I discussed the limitations of evaluation and management by telemedicine and the availability of in person appointments. The patient expressed understanding and agreed to proceed.  History of Present Illness: Patient last seen in 10/2019 for [facility] joint pain. Requesting renewal of Flexeril prescription.  Patient had responded well to an injection previously.  Been over a year.  Continues to have intermittent pain in the shoulder itself.  Doing the exercises fairly regularly.  Flexeril does seem to be very helpful.  Patient would like to see if he can go back down to the 5 mg tabs.   Observations/Objective: Alert and oriented x3.   Assessment and Plan: 39 year old male with recurrent shoulder pain.  Has not been seen for 1 year but responds well to the Flexeril.  Does have some underlying psychological aspect that can also contribute to some of the pain.  Discussed with patient that he did seem to respond very well to injection and we can repeat that if necessary.  If pain does not completely resolve in the next 2 to 3 months I would like to see him in office for further evaluation and possibly the injections.   Follow Up Instructions: 2 to 3 months in office    I discussed the assessment and treatment plan with the patient. The patient was provided an opportunity to ask questions and all were answered. The patient agreed with the plan and demonstrated an understanding of the instructions.   The patient was advised to call back or seek an in-person evaluation if the symptoms worsen or if the condition fails to improve as anticipated.  I provided 19 minutes of [place] time during this  encounter.   [name], DO

## 2020-10-20 ENCOUNTER — Ambulatory Visit (INDEPENDENT_AMBULATORY_CARE_PROVIDER_SITE_OTHER): Payer: No Typology Code available for payment source | Admitting: Family Medicine

## 2020-10-20 ENCOUNTER — Encounter: Payer: Self-pay | Admitting: Family Medicine

## 2020-10-20 ENCOUNTER — Other Ambulatory Visit: Payer: Self-pay

## 2020-10-20 DIAGNOSIS — S43431A Superior glenoid labrum lesion of right shoulder, initial encounter: Secondary | ICD-10-CM

## 2020-10-20 DIAGNOSIS — G2589 Other specified extrapyramidal and movement disorders: Secondary | ICD-10-CM | POA: Diagnosis not present

## 2020-10-20 DIAGNOSIS — M25511 Pain in right shoulder: Secondary | ICD-10-CM | POA: Diagnosis not present

## 2020-10-20 MED ORDER — CYCLOBENZAPRINE HCL 5 MG PO TABS: 5.0000 mg | ORAL_TABLET | Freq: Three times a day (TID) | ORAL | 6 refills | Status: DC | PRN

## 2020-10-21 ENCOUNTER — Other Ambulatory Visit: Payer: Self-pay | Admitting: Family Medicine

## 2021-03-09 ENCOUNTER — Other Ambulatory Visit: Payer: Self-pay | Admitting: Family Medicine

## 2021-04-21 ENCOUNTER — Encounter: Payer: Self-pay | Admitting: Family Medicine

## 2021-04-21 ENCOUNTER — Ambulatory Visit: Payer: Self-pay

## 2021-04-21 ENCOUNTER — Other Ambulatory Visit: Payer: Self-pay

## 2021-04-21 ENCOUNTER — Ambulatory Visit: Payer: No Typology Code available for payment source | Admitting: Family Medicine

## 2021-04-21 VITALS — BP 110/80 | HR 82 | Ht 73.0 in | Wt 181.0 lb

## 2021-04-21 DIAGNOSIS — M79631 Pain in right forearm: Secondary | ICD-10-CM | POA: Diagnosis not present

## 2021-04-21 NOTE — Progress Notes (Signed)
   I, [name], LAT, ATC, am serving as scribe for Dr. Lynne Leader.  [name] is a 40 y.o. male who presents to [place] [facility] today for R shoulder/arm pain f/u.  He was last seen virtually by Dr. Tamala Julian on 10/20/20 for R shoulder pain f/u and refill of Flexeril rx.  Since his last visit w/ Dr. Tamala Julian, pt reports having forearm pain that he feels like could have some sort of fracture. The pain radiates to the shoulder, but is not his normal bursitis pain. Patient states that on Monday he woke up with the pain, states he has been moving heavy boxes and containers and thinks that could of possibly caused the pain, but did not have any pain that day it was the next day the pain started.    Pertinent review of systems: No fevers or chills  Relevant historical information: History of substance abuse   Exam:  BP 110/80 (BP Location: Left Arm, Patient Position: Sitting, Cuff Size: Normal)   Pulse 82   Ht 6\' 1"  (1.854 m)   Wt 181 lb (82.1 kg)   SpO2 98%   BMI 23.88 kg/m  General: Well Developed, well nourished, and in no acute distress.   MSK: Right elbow normal-appearing nontender normal elbow motion and strength. Right forearm normal-appearing tender palpation dorsal forearm mildly. No masses palpated. Right wrist normal-appearing nontender.  Normal wrist motion pain with resisted wrist and finger extension located in dorsal forearm. Grip strength pulses cap refill and sensation are intact distally.  C-spine normal-appearing nontender cervical midline.  Normal cervical motion.  Upper summary strength reflexes and sensation are equal and normal throughout.    Assessment and Plan: 40 y.o. male with right dorsal forearm muscle strain or overuse related pain.  No acute structural injury noted. Plan for home exercise program focused mostly on wrist extensors and hand extensors on dorsal forearm.  Recommend Tylenol as well.  Recheck back in the near future if  not improving.  Next step if not improved would be referral to hand PT.    Discussed warning signs or symptoms. Please see discharge instructions. Patient expresses understanding.   The above documentation has been reviewed and is accurate and complete Lynne Leader, M.D.

## 2021-04-21 NOTE — Patient Instructions (Addendum)
Thank you for coming in today.  I think this is a strain or overuse injury to the muscles on the forearm.  We will give you exercises for theses (tennis elbow exercises)   Please perform the exercise program that we have prepared for you and gone over in detail on a daily basis.  In addition to the handout you were provided you can access your program through: [email or web address]   Your unique program code is:  [email or web address]  If you need to rest the forearm but still be active a wrist brace could help.   If you are not improving let me know. I will refer to hand PT which is very likely to get you better.   Keep me updated.

## 2021-10-06 ENCOUNTER — Other Ambulatory Visit: Payer: Self-pay | Admitting: Family Medicine

## 2021-12-29 ENCOUNTER — Other Ambulatory Visit: Payer: Self-pay | Admitting: Family Medicine

## 2022-02-15 NOTE — Progress Notes (Signed)
?Charlann Boxer D.O. ?[facility] ?[place] ?Phone: [phone] ?Subjective:   ?I, Vilma Meckel, am serving as a Education administrator for Dr. Hulan Saas. ?This visit occurred during the SARS-CoV-2 public health emergency.  Safety protocols were in place, including screening questions prior to the visit, additional usage of staff PPE, and extensive cleaning of exam room while observing appropriate contact time as indicated for disinfecting solutions.  ? ?I'm seeing this patient by the request  of:  Hoyt Koch, MD ? ?CC: Shoulder pain and back pain follow-up ? ?[id number]  ?TERELLE DOBLER is a 41 y.o. male coming in with complaint of med refill. Last seen for shoulder pain in 2021. Patient states back pain. Low level constant pain. Wants to do PT. patient feels the back pain is getting worse.  More of a tightness.  Patient has not been very energetic recently.  Patient is actually being transitioned off of the Librium as well.  Patient states that he has improved previously with physical therapy and at some point he would like to start due to also needs refill of medication.  Feels that Flexeril is beneficial when he was able to take it. ? ? ? ?  ? ?Past Medical History:  ?Diagnosis Date  ? Alcohol abuse   ? Allergy   ? Anxiety   ? Anxiety and depression   ? Benzodiazepine dependence ([place])   ? Depression   ? Difficulty urinating   ? cant urinate in public places (restrooms)  ? Diverticulitis   ? Opioid dependence ([place])   ? Pilonidal cyst   ? Seasonal allergies   ? Skull fracture ([place]) 2004  ? Trauma  ? Tension headache   ? Tubular adenoma of colon   ? ?Past Surgical History:  ?Procedure Laterality Date  ? ORIF right 3rd finger  2012  ? Dr. Fredna Dow - see hospital notes.  ? pilonidal cystectomy  08/01/2011  ? RADIOFREQUENCY ABLATION NERVES  12/10/2012  ? VASECTOMY  04/2010  ? WISDOM TOOTH EXTRACTION    ? ?Social History  ? ?Socioeconomic History  ? Marital status: Married  ?  Spouse  name: Not on file  ? Number of children: 0  ? Years of education: 29  ? Highest education level: Not on file  ?Occupational History  ? Occupation: unemployed  ?Tobacco Use  ? Smoking status: Former  ?  Packs/day: 1.50  ?  Years: 7.00  ?  Pack years: 10.50  ?  Types: Cigarettes  ?  Quit date: 12/04/2006  ?  Years since quitting: 15.2  ? Smokeless tobacco: Never  ? Tobacco comments:  ?  quit 2 or more years  ?Vaping Use  ? Vaping Use: Every day  ? Substances: Nicotine  ?Substance and Sexual Activity  ? Alcohol use: No  ?  Alcohol/week: 0.0 standard drinks  ? Drug use: Yes  ?  Types: Marijuana  ?  Comment: last smoke a "few days ago"  ? Sexual activity: Yes  ?  Partners: Female  ?  Birth control/protection: None  ?Other Topics Concern  ? Not on file  ?Social History Narrative  ? HSG, attending [employer] (09/2010). Married June 2011.No children. NO history of physical or sexual abuse. Not working at this time.   ?   ?   ?   ? ?Social Determinants of Health  ? ?Financial Resource Strain: Not on file  ?Food Insecurity: Not on file  ?Transportation Needs: Not on file  ?Physical Activity:  Not on file  ?Stress: Not on file  ?Social Connections: Not on file  ? ?Allergies  ?Allergen Reactions  ? Avelox [Moxifloxacin Hcl In Nacl] Nausea And Vomiting  ? Fluoxetine   ?  Intolerant of all SSRI's, NE drugs  ? Nsaids Other (See Comments)  ? Other Nausea And Vomiting  ?  Strong antibiotics by mouth / antidepressants  ? Moxifloxacin Nausea And Vomiting  ? ?Family History  ?Problem Relation Age of Onset  ? Depression Mother   ? Anxiety disorder Mother   ? Alcohol abuse Mother   ? Hypertension Father   ? Hyperlipidemia Father   ? Colon polyps Paternal Grandfather   ? Cancer Paternal Aunt   ?     breast  ? Breast cancer Paternal Aunt   ? Colon cancer Neg Hx   ? Esophageal cancer Neg Hx   ? Pancreatic cancer Neg Hx   ? Stomach cancer Neg Hx   ? Rectal cancer Neg Hx   ? ? ? ? ? ? ? ?Current Outpatient Medications (Other):  ?  chlordiazePOXIDE  (LIBRIUM) 25 MG capsule, Take 25 mg by mouth daily. ?  clonazePAM (KLONOPIN) 1 MG tablet, Take 1-2 mg by mouth 3 (three) times daily. ?  cyclobenzaprine (FLEXERIL) 10 MG tablet, TAKE 1 TABLET BY MOUTH THREE TIMES A DAY AS NEEDED FOR MUSCLE SPASMS ?  cyclobenzaprine (FLEXERIL) 5 MG tablet, Take 1 tablet (5 mg total) by mouth 3 (three) times daily as needed for muscle spasms. ?  Dextromethorphan-quiNIDine (NUEDEXTA) 20-10 MG capsule, Take 1 capsule by mouth 2 (two) times daily. ?  hydrOXYzine (VISTARIL) 25 MG capsule, Take 75 mg by mouth at bedtime as needed for anxiety.  ?  Vitamin D, Ergocalciferol, (DRISDOL) 1.25 MG (50000 UNIT) CAPS capsule, TAKE 1 CAPSULE (50,000 UNITS TOTAL) BY MOUTH EVERY 7 (SEVEN) DAYS ? ? ?Reviewed prior external information including notes and imaging from  ?primary care provider ?As well as notes that were available from care everywhere and other healthcare systems. ? ?Past medical history, social, surgical and family history all reviewed in electronic medical record.  No pertanent information unless stated regarding to the chief complaint.  ? ?Review of Systems: ? No headache, visual changes, nausea, vomiting, diarrhea, constipation, dizziness, abdominal pain, skin rash, fevers, chills, night sweats, weight loss, swollen lymph nodes,  joint swelling, chest pain, shortness of breath, mood changes. POSITIVE muscle aches, body aches ? ?Objective  ?Blood pressure 114/86, pulse 92, height '6\' 1"'$  (1.854 m), weight 188 lb (85.3 kg), SpO2 97 %. ?  ?General: No apparent distress alert and oriented x3 mood and affect normal, dressed appropriately.  ?HEENT: Pupils equal, extraocular movements intact  ?Respiratory: Patient's speak in full sentences and does not appear short of breath  ?Cardiovascular: No lower extremity edema, non tender, no erythema  ?Gait normal with good balance and coordination.  ?MSK: Back exam does have some loss of lordosis.  Some tenderness to palpation in the paraspinal  musculature.  Patient does have some tightness noted of the hip flexors bilaterally.  Patient otherwise does have some hypermobility of the other joints. ? ?  ?Impression and Recommendations:  ?  ? ?The above documentation has been reviewed and is accurate and complete [name], DO ? ? ? ?

## 2022-02-21 ENCOUNTER — Ambulatory Visit: Payer: No Typology Code available for payment source | Admitting: Family Medicine

## 2022-02-21 ENCOUNTER — Ambulatory Visit (INDEPENDENT_AMBULATORY_CARE_PROVIDER_SITE_OTHER): Payer: No Typology Code available for payment source

## 2022-02-21 ENCOUNTER — Other Ambulatory Visit: Payer: Self-pay

## 2022-02-21 DIAGNOSIS — M4317 Spondylolisthesis, lumbosacral region: Secondary | ICD-10-CM | POA: Diagnosis not present

## 2022-02-21 MED ORDER — CYCLOBENZAPRINE HCL 5 MG PO TABS: 5.0000 mg | ORAL_TABLET | Freq: Three times a day (TID) | ORAL | 6 refills | Status: DC | PRN

## 2022-02-21 NOTE — Patient Instructions (Addendum)
Good to see you! ?Xrays today ?Prescription sent, PT ordered ?Start doing exercises ?See you again in 2 weeks ?

## 2022-02-21 NOTE — Assessment & Plan Note (Signed)
History of spinal thesis noted.  Patient will get repeat x-rays with her last 1 being greater than 6 years ago at this time.  Patient does not have any significant radicular symptoms but does have significant tightness noted.  Discussed with patient about icing regimen and home exercises.  Discussed FABER test right greater than left.  Patient wants to restart formal physical therapy which I think will be beneficial.  Refilled cyclobenzaprine 5 mg 3 times a day as needed.  Patient is in the process of being taken off of his Librium which I do think is a good idea and does have Klonopin for breakthrough.  Patient will follow-up with me again in 2 to 3 months.  Total time reviewing patient's chart and discussing with patient and his significant other greater than 31 minutes ?

## 2022-04-18 NOTE — Progress Notes (Deleted)
[facility] [place] Phone: [phone] Subjective:    I'm seeing this patient by the request  of:  Hoyt Koch, MD  CC:   [id number]  02/21/2022 History of spinal thesis noted.  Patient will get repeat x-rays with her last 1 being greater than 6 years ago at this time.  Patient does not have any significant radicular symptoms but does have significant tightness noted.  Discussed with patient about icing regimen and home exercises.  Discussed FABER test right greater than left.  Patient wants to restart formal physical therapy which I think will be beneficial.  Refilled cyclobenzaprine 5 mg 3 times a day as needed.  Patient is in the process of being taken off of his Librium which I do think is a good idea and does have Klonopin for breakthrough.  Patient will follow-up with me again in 2 to 3 months.  Total time reviewing patient's chart and discussing with patient and his significant other greater than 31 minutes  Update 04/19/2022 [name] is a 41 y.o. male coming in with complaint of lumbar spine pain. Patient states   Xray 02/21/2022 lumbar w flx/ext IMPRESSION: 1. Chronic bilateral pars defects at L5-S1 with a grade 1 anterolisthesis, stable from the prior exam. 2. No other abnormalities.       Past Medical History:  Diagnosis Date   Alcohol abuse    Allergy    Anxiety    Anxiety and depression    Benzodiazepine dependence ([place])    Depression    Difficulty urinating    cant urinate in public places (restrooms)   Diverticulitis    Opioid dependence ([place])    Pilonidal cyst    Seasonal allergies    Skull fracture ([place]) 2004   Trauma   Tension headache    Tubular adenoma of colon    Past Surgical History:  Procedure Laterality Date   ORIF right 3rd finger  2012   Dr. Fredna Dow - see hospital notes.   pilonidal cystectomy  08/01/2011   RADIOFREQUENCY ABLATION NERVES  12/10/2012   VASECTOMY  04/2010    WISDOM TOOTH EXTRACTION     Social History   Socioeconomic History   Marital status: Married    Spouse name: Not on file   Number of children: 0   Years of education: 12   Highest education level: Not on file  Occupational History   Occupation: unemployed  Tobacco Use   Smoking status: Former    Packs/day: 1.50    Years: 7.00    Pack years: 10.50    Types: Cigarettes    Quit date: 12/04/2006    Years since quitting: 15.3   Smokeless tobacco: Never   Tobacco comments:    quit 2 or more years  Vaping Use   Vaping Use: Every day   Substances: Nicotine  Substance and Sexual Activity   Alcohol use: No    Alcohol/week: 0.0 standard drinks   Drug use: Yes    Types: Marijuana    Comment: last smoke a "few days ago"   Sexual activity: Yes    Partners: Female    Birth control/protection: None  Other Topics Concern   Not on file  Social History Narrative   HSG, attending [employer] (09/2010). Married June 2011.No children. NO history of physical or sexual abuse. Not working at this time.             Social Determinants of Health  Financial Resource Strain: Not on file  Food Insecurity: Not on file  Transportation Needs: Not on file  Physical Activity: Not on file  Stress: Not on file  Social Connections: Not on file   Allergies  Allergen Reactions   Avelox [Moxifloxacin Hcl In Nacl] Nausea And Vomiting   Fluoxetine     Intolerant of all SSRI's, NE drugs   Nsaids Other (See Comments)   Other Nausea And Vomiting    Strong antibiotics by mouth / antidepressants   Moxifloxacin Nausea And Vomiting   Family History  Problem Relation Age of Onset   Depression Mother    Anxiety disorder Mother    Alcohol abuse Mother    Hypertension Father    Hyperlipidemia Father    Colon polyps Paternal Grandfather    Cancer Paternal Aunt        breast   Breast cancer Paternal Aunt    Colon cancer Neg Hx    Esophageal cancer Neg Hx    Pancreatic cancer Neg Hx    Stomach cancer  Neg Hx    Rectal cancer Neg Hx          Current Outpatient Medications (Other):    chlordiazePOXIDE (LIBRIUM) 25 MG capsule, Take 25 mg by mouth daily.   clonazePAM (KLONOPIN) 1 MG tablet, Take 1-2 mg by mouth 3 (three) times daily.   cyclobenzaprine (FLEXERIL) 10 MG tablet, TAKE 1 TABLET BY MOUTH THREE TIMES A DAY AS NEEDED FOR MUSCLE SPASMS   cyclobenzaprine (FLEXERIL) 5 MG tablet, Take 1 tablet (5 mg total) by mouth 3 (three) times daily as needed for muscle spasms.   Dextromethorphan-quiNIDine (NUEDEXTA) 20-10 MG capsule, Take 1 capsule by mouth 2 (two) times daily.   hydrOXYzine (VISTARIL) 25 MG capsule, Take 75 mg by mouth at bedtime as needed for anxiety.    Vitamin D, Ergocalciferol, (DRISDOL) 1.25 MG (50000 UNIT) CAPS capsule, TAKE 1 CAPSULE (50,000 UNITS TOTAL) BY MOUTH EVERY 7 (SEVEN) DAYS   Reviewed prior external information including notes and imaging from  primary care provider As well as notes that were available from care everywhere and other healthcare systems.  Past medical history, social, surgical and family history all reviewed in electronic medical record.  No pertanent information unless stated regarding to the chief complaint.   Review of Systems:  No headache, visual changes, nausea, vomiting, diarrhea, constipation, dizziness, abdominal pain, skin rash, fevers, chills, night sweats, weight loss, swollen lymph nodes, body aches, joint swelling, chest pain, shortness of breath, mood changes. POSITIVE muscle aches  Objective  There were no vitals taken for this visit.   General: No apparent distress alert and oriented x3 mood and affect normal, dressed appropriately.  HEENT: Pupils equal, extraocular movements intact  Respiratory: Patient's speak in full sentences and does not appear short of breath  Cardiovascular: No lower extremity edema, non tender, no erythema  Gait normal with good balance and coordination.  MSK:  Non tender with full range of motion  and good stability and symmetric strength and tone of shoulders, elbows, wrist, hip, knee and ankles bilaterally.     Impression and Recommendations:     The above documentation has been reviewed and is accurate and complete Jacqualin Combes

## 2022-04-24 ENCOUNTER — Ambulatory Visit: Payer: No Typology Code available for payment source | Admitting: Family Medicine

## 2022-05-18 NOTE — Progress Notes (Deleted)
[facility] [facility] [name] Phone: [phone] Subjective:    I'm seeing this patient by the request  of:  Hoyt Koch, MD  CC:   [id number]  02/21/2022 History of spinal thesis noted.  Patient will get repeat x-rays with her last 1 being greater than 6 years ago at this time.  Patient does not have any significant radicular symptoms but does have significant tightness noted.  Discussed with patient about icing regimen and home exercises.  Discussed FABER test right greater than left.  Patient wants to restart formal physical therapy which I think will be beneficial.  Refilled cyclobenzaprine 5 mg 3 times a day as needed.  Patient is in the process of being taken off of his Librium which I do think is a good idea and does have Klonopin for breakthrough.  Patient will follow-up with me again in 2 to 3 months.  Total time reviewing patient's chart and discussing with patient and his significant other greater than 31 minutes  Update 05/22/2022 [name] is a 41 y.o. male coming in with complaint of lumbar spine pain. Patient states       Past Medical History:  Diagnosis Date   Alcohol abuse    Allergy    Anxiety    Anxiety and depression    Benzodiazepine dependence ([place])    Depression    Difficulty urinating    cant urinate in public places (restrooms)   Diverticulitis    Opioid dependence ([place])    Pilonidal cyst    Seasonal allergies    Skull fracture ([place]) 2004   Trauma   Tension headache    Tubular adenoma of colon    Past Surgical History:  Procedure Laterality Date   ORIF right 3rd finger  2012   Dr. Fredna Dow - see hospital notes.   pilonidal cystectomy  08/01/2011   RADIOFREQUENCY ABLATION NERVES  12/10/2012   VASECTOMY  04/2010   WISDOM TOOTH EXTRACTION     Social History   Socioeconomic History   Marital status: Married    Spouse name: Not on file   Number of children: 0   Years of education: 12    Highest education level: Not on file  Occupational History   Occupation: unemployed  Tobacco Use   Smoking status: Former    Packs/day: 1.50    Years: 7.00    Total pack years: 10.50    Types: Cigarettes    Quit date: 12/04/2006    Years since quitting: 15.4   Smokeless tobacco: Never   Tobacco comments:    quit 2 or more years  Vaping Use   Vaping Use: Every day   Substances: Nicotine  Substance and Sexual Activity   Alcohol use: No    Alcohol/week: 0.0 standard drinks of alcohol   Drug use: Yes    Types: Marijuana    Comment: last smoke a "few days ago"   Sexual activity: Yes    Partners: Female    Birth control/protection: None  Other Topics Concern   Not on file  Social History Narrative   HSG, attending [employer] (09/2010). Married June 2011.No children. NO history of physical or sexual abuse. Not working at this time.             Social Determinants of Health   Financial Resource Strain: Not on file  Food Insecurity: Not on file  Transportation Needs: Not on file  Physical Activity: Not on file  Stress: Not on file  Social Connections: Not on file   Allergies  Allergen Reactions   Avelox [Moxifloxacin Hcl In Nacl] Nausea And Vomiting   Fluoxetine     Intolerant of all SSRI's, NE drugs   Nsaids Other (See Comments)   Other Nausea And Vomiting    Strong antibiotics by mouth / antidepressants   Moxifloxacin Nausea And Vomiting   Family History  Problem Relation Age of Onset   Depression Mother    Anxiety disorder Mother    Alcohol abuse Mother    Hypertension Father    Hyperlipidemia Father    Colon polyps Paternal Grandfather    Cancer Paternal Aunt        breast   Breast cancer Paternal Aunt    Colon cancer Neg Hx    Esophageal cancer Neg Hx    Pancreatic cancer Neg Hx    Stomach cancer Neg Hx    Rectal cancer Neg Hx          Current Outpatient Medications (Other):    chlordiazePOXIDE (LIBRIUM) 25 MG capsule, Take 25 mg by mouth daily.    clonazePAM (KLONOPIN) 1 MG tablet, Take 1-2 mg by mouth 3 (three) times daily.   cyclobenzaprine (FLEXERIL) 10 MG tablet, TAKE 1 TABLET BY MOUTH THREE TIMES A DAY AS NEEDED FOR MUSCLE SPASMS   cyclobenzaprine (FLEXERIL) 5 MG tablet, Take 1 tablet (5 mg total) by mouth 3 (three) times daily as needed for muscle spasms.   Dextromethorphan-quiNIDine (NUEDEXTA) 20-10 MG capsule, Take 1 capsule by mouth 2 (two) times daily.   hydrOXYzine (VISTARIL) 25 MG capsule, Take 75 mg by mouth at bedtime as needed for anxiety.    Vitamin D, Ergocalciferol, (DRISDOL) 1.25 MG (50000 UNIT) CAPS capsule, TAKE 1 CAPSULE (50,000 UNITS TOTAL) BY MOUTH EVERY 7 (SEVEN) DAYS   Reviewed prior external information including notes and imaging from  primary care provider As well as notes that were available from care everywhere and other healthcare systems.  Past medical history, social, surgical and family history all reviewed in electronic medical record.  No pertanent information unless stated regarding to the chief complaint.   Review of Systems:  No headache, visual changes, nausea, vomiting, diarrhea, constipation, dizziness, abdominal pain, skin rash, fevers, chills, night sweats, weight loss, swollen lymph nodes, body aches, joint swelling, chest pain, shortness of breath, mood changes. POSITIVE muscle aches  Objective  There were no vitals taken for this visit.   General: No apparent distress alert and oriented x3 mood and affect normal, dressed appropriately.  HEENT: Pupils equal, extraocular movements intact  Respiratory: Patient's speak in full sentences and does not appear short of breath  Cardiovascular: No lower extremity edema, non tender, no erythema      Impression and Recommendations:

## 2022-05-22 ENCOUNTER — Ambulatory Visit: Payer: No Typology Code available for payment source | Admitting: Family Medicine

## 2022-06-30 ENCOUNTER — Ambulatory Visit: Payer: No Typology Code available for payment source | Admitting: Family Medicine

## 2022-08-10 ENCOUNTER — Ambulatory Visit
Admission: EM | Admit: 2022-08-10 | Discharge: 2022-08-10 | Disposition: A | Discharge status: Home / Self Care | Payer: No Typology Code available for payment source | Attending: Urgent Care | Admitting: Urgent Care

## 2022-08-10 DIAGNOSIS — H6121 Impacted cerumen, right ear: Secondary | ICD-10-CM

## 2022-08-10 MED ORDER — CARBAMIDE PEROXIDE 6.5 % OT SOLN: 5.0000 [drp] | Freq: Every day | OTIC | 0 refills | Status: DC | PRN

## 2022-08-10 NOTE — ED Triage Notes (Signed)
Pt. States he has been having right ear pain for the last 3-4 days.

## 2022-08-10 NOTE — ED Provider Notes (Signed)
[facility]  Note:  This document was prepared using Systems analyst and may include unintentional dictation errors.  MRN: [id number] DOB: 1981/06/06  Subjective:   [name] is a 41 y.o. male presenting for 3 to 4-day history of acute onset right ear pain, right ear fullness and decreased hearing.  No fever, runny or stuffy nose, tinnitus, dizziness, sore throat, cough.  Patient has used Q-tips to try and clear the earwax out.  No current facility-administered medications for this encounter.  Current Outpatient Medications:    chlordiazePOXIDE (LIBRIUM) 25 MG capsule, Take 25 mg by mouth daily., Disp: , Rfl:    clonazePAM (KLONOPIN) 1 MG tablet, Take 1-2 mg by mouth 3 (three) times daily., Disp: , Rfl:    cyclobenzaprine (FLEXERIL) 10 MG tablet, TAKE 1 TABLET BY MOUTH THREE TIMES A DAY AS NEEDED FOR MUSCLE SPASMS, Disp: 90 tablet, Rfl: 3   cyclobenzaprine (FLEXERIL) 5 MG tablet, Take 1 tablet (5 mg total) by mouth 3 (three) times daily as needed for muscle spasms., Disp: 90 tablet, Rfl: 6   Dextromethorphan-quiNIDine (NUEDEXTA) 20-10 MG capsule, Take 1 capsule by mouth 2 (two) times daily., Disp: , Rfl:    hydrOXYzine (VISTARIL) 25 MG capsule, Take 75 mg by mouth at bedtime as needed for anxiety. , Disp: , Rfl:    Vitamin D, Ergocalciferol, (DRISDOL) 1.25 MG (50000 UNIT) CAPS capsule, TAKE 1 CAPSULE (50,000 UNITS TOTAL) BY MOUTH EVERY 7 (SEVEN) DAYS, Disp: 12 capsule, Rfl: 0   Allergies  Allergen Reactions   Avelox [Moxifloxacin Hcl In Nacl] Nausea And Vomiting   Fluoxetine     Intolerant of all SSRI's, NE drugs   Nsaids Other (See Comments)   Other Nausea And Vomiting    Strong antibiotics by mouth / antidepressants   Moxifloxacin Nausea And Vomiting    Past Medical History:  Diagnosis Date   Alcohol abuse    Allergy    Anxiety    Anxiety and depression    Benzodiazepine dependence ([facility])    Depression    Difficulty urinating     cant urinate in public places (restrooms)   Diverticulitis    Opioid dependence ([place])    Pilonidal cyst    Seasonal allergies    Skull fracture ([place]) 2004   Trauma   Tension headache    Tubular adenoma of colon      Past Surgical History:  Procedure Laterality Date   ORIF right 3rd finger  2012   Dr. Fredna Dow - see hospital notes.   pilonidal cystectomy  08/01/2011   RADIOFREQUENCY ABLATION NERVES  12/10/2012   VASECTOMY  04/2010   WISDOM TOOTH EXTRACTION      Family History  Problem Relation Age of Onset   Depression Mother    Anxiety disorder Mother    Alcohol abuse Mother    Hypertension Father    Hyperlipidemia Father    Colon polyps Paternal Grandfather    Cancer Paternal Aunt        breast   Breast cancer Paternal Aunt    Colon cancer Neg Hx    Esophageal cancer Neg Hx    Pancreatic cancer Neg Hx    Stomach cancer Neg Hx    Rectal cancer Neg Hx     Social History   Tobacco Use   Smoking status: Former    Packs/day: 1.50    Years: 7.00    Total pack years: 10.50    Types: Cigarettes  Quit date: 12/04/2006    Years since quitting: 15.6   Smokeless tobacco: Never   Tobacco comments:    quit 2 or more years  Vaping Use   Vaping Use: Every day   Substances: Nicotine  Substance Use Topics   Alcohol use: No    Alcohol/week: 0.0 standard drinks of alcohol   Drug use: Yes    Types: Marijuana    Comment: last smoke a "few days ago"    ROS   Objective:   Vitals: BP 125/83   Pulse 86   Temp 99.3 F (37.4 C)   Resp 16   SpO2 96%   Physical Exam Constitutional:      General: He is not in acute distress.    Appearance: Normal appearance. He is well-developed and normal weight. He is not ill-appearing, toxic-appearing or diaphoretic.  HENT:     Head: Normocephalic and atraumatic.     Right Ear: Tympanic membrane, ear canal and external ear normal. There is impacted cerumen.     Left Ear: Tympanic membrane, ear canal and external ear normal. There is  no impacted cerumen.     Nose: Nose normal.     Mouth/Throat:     Pharynx: Oropharynx is clear.  Eyes:     General: No scleral icterus.       Right eye: No discharge.        Left eye: No discharge.     Extraocular Movements: Extraocular movements intact.  Cardiovascular:     Rate and Rhythm: Normal rate.  Pulmonary:     Effort: Pulmonary effort is normal.  Musculoskeletal:     Cervical back: Normal range of motion.  Neurological:     Mental Status: He is alert and oriented to person, place, and time.  Psychiatric:        Mood and Affect: Mood normal.        Behavior: Behavior normal.        Thought Content: Thought content normal.        Judgment: Judgment normal.     Ear lavage performed using mixture of peroxide and water.  Pressure irrigation performed using a bottle and a thin ear tube.  Right ear lavage.  No curette was used.   Assessment and Plan :   PDMP not reviewed this encounter.  1. Impacted cerumen of right ear    Successful right ear lavage.  General management of cerumen impaction reviewed with patient.  Anticipatory guidance provided. Counseled patient on potential for adverse effects with medications prescribed/recommended today, ER and return-to-clinic precautions discussed, patient verbalized understanding.     Jaynee Eagles, [place] 08/10/22 1702

## 2022-08-30 ENCOUNTER — Encounter: Payer: Self-pay | Admitting: Family Medicine

## 2022-08-30 ENCOUNTER — Ambulatory Visit: Payer: No Typology Code available for payment source | Admitting: Family Medicine

## 2022-08-30 VITALS — BP 130/86 | HR 88 | Temp 97.8°F | Ht 73.0 in | Wt 202.0 lb

## 2022-08-30 DIAGNOSIS — F419 Anxiety disorder, unspecified: Secondary | ICD-10-CM | POA: Diagnosis not present

## 2022-08-30 DIAGNOSIS — K219 Gastro-esophageal reflux disease without esophagitis: Secondary | ICD-10-CM | POA: Diagnosis not present

## 2022-08-30 DIAGNOSIS — E7849 Other hyperlipidemia: Secondary | ICD-10-CM | POA: Diagnosis not present

## 2022-08-30 NOTE — Progress Notes (Signed)
New Patient Office Visit  Subjective    Patient ID: [name], male    DOB: 03-18-1981  Age: 41 y.o. MRN: [id number]  CC:  Chief Complaint  Patient presents with   Transitions Of Care    No concerns.     HPI [name] presents to establish care. His wife is with him.   Dr. Tamala Julian- Sports Medicine Dr. Toy CareElberta Fortis at [facility]  GI- Dr. Hilarie Fredrickson   He sees Dr. Toy Care for anxiety   Not currently working   Married. No kids.   GERD- is not taking anything for this currently.   Having a lot of dental work done.   Denies fever, chills, dizziness, chest pain, palpitations, shortness of breath, abdominal pain, N/V/D       08/30/2022    4:27 PM  Depression screen PHQ 2/9  Decreased Interest 3  Down, Depressed, Hopeless 3  PHQ - 2 Score 6  Altered sleeping 3  Tired, decreased energy 3  Change in appetite 3  Feeling bad or failure about yourself  3  Trouble concentrating 3  Suicidal thoughts 0  PHQ-9 Score 21  Difficult doing work/chores Extremely dIfficult     Outpatient Encounter Medications as of 08/30/2022  Medication Sig   clonazePAM (KLONOPIN) 1 MG tablet Take 1-2 mg by mouth 3 (three) times daily.   cyclobenzaprine (FLEXERIL) 5 MG tablet Take 1 tablet (5 mg total) by mouth 3 (three) times daily as needed for muscle spasms.   Dextromethorphan-quiNIDine (NUEDEXTA) 20-10 MG capsule Take 1 capsule by mouth 2 (two) times daily.   gabapentin (NEURONTIN) 800 MG tablet Take 800 mg by mouth 4 (four) times daily.   hydrOXYzine (VISTARIL) 25 MG capsule Take 75 mg by mouth at bedtime as needed for anxiety.    lamoTRIgine (LAMICTAL) 150 MG tablet Take 150 mg by mouth daily.   propranolol (INDERAL) 20 MG tablet Take 20 mg by mouth 4 (four) times daily.   [DISCONTINUED] carbamide peroxide (DEBROX) 6.5 % OTIC solution Place 5 drops into both ears daily as needed.   [DISCONTINUED] chlordiazePOXIDE (LIBRIUM) 25 MG capsule Take 25 mg by mouth daily.    [DISCONTINUED] cyclobenzaprine (FLEXERIL) 10 MG tablet TAKE 1 TABLET BY MOUTH THREE TIMES A DAY AS NEEDED FOR MUSCLE SPASMS   [DISCONTINUED] Vitamin D, Ergocalciferol, (DRISDOL) 1.25 MG (50000 UNIT) CAPS capsule TAKE 1 CAPSULE (50,000 UNITS TOTAL) BY MOUTH EVERY 7 (SEVEN) DAYS   No facility-administered encounter medications on file as of 08/30/2022.    Past Medical History:  Diagnosis Date   Alcohol abuse    Allergy    Anxiety    Anxiety and depression    Benzodiazepine dependence ([place])    Depression    Difficulty urinating    cant urinate in public places (restrooms)   Diverticulitis    Opioid dependence ([place])    Pilonidal cyst    Seasonal allergies    Skull fracture ([place]) 2004   Trauma   Tension headache    Tubular adenoma of colon     Past Surgical History:  Procedure Laterality Date   ORIF right 3rd finger  2012   Dr. Fredna Dow - see hospital notes.   pilonidal cystectomy  08/01/2011   RADIOFREQUENCY ABLATION NERVES  12/10/2012   VASECTOMY  04/2010   WISDOM TOOTH EXTRACTION      Family History  Problem Relation Age of Onset   Depression Mother    Anxiety disorder Mother    Alcohol abuse Mother  Hypertension Father    Hyperlipidemia Father    Colon polyps Paternal Grandfather    Cancer Paternal Aunt        breast   Breast cancer Paternal Aunt    Colon cancer Neg Hx    Esophageal cancer Neg Hx    Pancreatic cancer Neg Hx    Stomach cancer Neg Hx    Rectal cancer Neg Hx     Social History   Socioeconomic History   Marital status: Married    Spouse name: Not on file   Number of children: 0   Years of education: 12   Highest education level: Not on file  Occupational History   Occupation: unemployed  Tobacco Use   Smoking status: Former    Packs/day: 1.50    Years: 7.00    Total pack years: 10.50    Types: Cigarettes    Quit date: 12/04/2006    Years since quitting: 15.7   Smokeless tobacco: Never   Tobacco comments:    quit 2 or more years  Vaping  Use   Vaping Use: Every day   Substances: Nicotine  Substance and Sexual Activity   Alcohol use: No    Alcohol/week: 0.0 standard drinks of alcohol   Drug use: Yes    Types: Marijuana    Comment: last smoke a "few days ago"   Sexual activity: Yes    Partners: Female    Birth control/protection: None  Other Topics Concern   Not on file  Social History Narrative   HSG, attending [employer] (09/2010). Married June 2011.No children. NO history of physical or sexual abuse. Not working at this time.             Social Determinants of Health   Financial Resource Strain: Not on file  Food Insecurity: Not on file  Transportation Needs: Not on file  Physical Activity: Not on file  Stress: Not on file  Social Connections: Not on file  Intimate Partner Violence: Not on file    ROS      Objective    BP 130/86 (BP Location: Left Arm, Patient Position: Sitting, Cuff Size: Large)   Pulse 88   Temp 97.8 F (36.6 C) (Temporal)   Ht '6\' 1"'$  (1.854 m)   Wt 202 lb (91.6 kg)   SpO2 98%   BMI 26.65 kg/m   Physical Exam Constitutional:      General: He is not in acute distress.    Appearance: He is not ill-appearing.  Cardiovascular:     Rate and Rhythm: Normal rate.  Pulmonary:     Effort: Pulmonary effort is normal.  Neurological:     General: No focal deficit present.     Mental Status: He is alert and oriented to person, place, and time.  Psychiatric:        Mood and Affect: Mood normal.        Behavior: Behavior normal.        Thought Content: Thought content normal. Thought content does not include suicidal ideation.         Assessment & Plan:   Problem List Items Addressed This Visit       Other   Anxiety   Other hyperlipidemia   Relevant Medications   propranolol (INDERAL) 20 MG tablet   Other Visit Diagnoses     Gastroesophageal reflux disease, unspecified whether esophagitis present    -  Primary      Continue seeing specialists including Dr. Toy Care for  mental  health issues.  He may need to start on a PPI for acid reflux but will continue avoiding NSAIDs.  GI is Dr. Hilarie Fredrickson if needed.  He will follow up for a fasting CPE.   Return for fasting CPE.   Harland Dingwall, NP-C

## 2022-09-11 ENCOUNTER — Encounter: Payer: No Typology Code available for payment source | Admitting: Internal Medicine

## 2022-10-03 ENCOUNTER — Encounter: Payer: No Typology Code available for payment source | Admitting: Internal Medicine

## 2022-11-09 ENCOUNTER — Encounter: Payer: No Typology Code available for payment source | Admitting: Family Medicine

## 2022-11-14 ENCOUNTER — Encounter: Payer: Self-pay | Admitting: Physician Assistant

## 2022-11-14 ENCOUNTER — Ambulatory Visit: Payer: No Typology Code available for payment source | Admitting: Physician Assistant

## 2022-11-14 VITALS — BP 126/84 | HR 61 | Ht 72.0 in | Wt 209.4 lb

## 2022-11-14 DIAGNOSIS — K219 Gastro-esophageal reflux disease without esophagitis: Secondary | ICD-10-CM | POA: Diagnosis not present

## 2022-11-14 DIAGNOSIS — R630 Anorexia: Secondary | ICD-10-CM | POA: Diagnosis not present

## 2022-11-14 MED ORDER — OMEPRAZOLE 20 MG PO CPDR: 20.0000 mg | DELAYED_RELEASE_CAPSULE | Freq: Every day | ORAL | 3 refills | Status: DC

## 2022-11-14 NOTE — Progress Notes (Signed)
Addendum: Reviewed and agree with assessment and management plan. Carine Nordgren M, MD  

## 2022-11-14 NOTE — Patient Instructions (Signed)
We have sent the following medications to your pharmacy for you to pick up at your convenience: Omeprazole 20 mg daily 30-60 minutes before dinner.    _______________________________________________________  If you are age 39 or older, your body mass index should be between 23-30. Your Body mass index is 28.4 kg/m. If this is out of the aforementioned range listed, please consider follow up with your Primary Care Provider.  If you are age 48 or younger, your body mass index should be between 19-25. Your Body mass index is 28.4 kg/m. If this is out of the aformentioned range listed, please consider follow up with your Primary Care Provider.   ________________________________________________________  The [facility] providers would like to encourage you to use [facility] to communicate with providers for non-urgent requests or questions.  Due to long hold times on the telephone, sending your provider a message by [facility] may be a faster and more efficient way to get a response.  Please allow 48 business hours for a response.  Please remember that this is for non-urgent requests.  _______________________________________________________

## 2022-11-14 NOTE — Progress Notes (Signed)
Chief Complaint: GERD  HPI:    [name] is a 41 year old male with a past medical history as listed below, known to Dr. Hilarie Fredrickson, who was referred to me by Girtha Rm, NP-C for a complaint of GERD.      04/07/2020 office visit with Dr. Hilarie Fredrickson with history of adenomatous colon polyp, colonic diverticulosis and presumed diverticulitis treated with antibiotics, at that time having trouble with anxiety during the COVID-19 pandemic that time discussed some left lower quadrant pain and diverticulitis like symptoms.  Symptoms had resolved with antibiotics.  He was scheduled for colonoscopy given history of adenoma.    06/14/2020 colonoscopy for personal history of nonadvanced adenoma with last colonoscopy in October 2016 with diverticulosis in the sigmoid and ascending colon and otherwise normal.  Repeat recommended at age 55.    08/30/2022 office visit with PCP for GERD and at that time discussed that he should avoid NSAIDs.    Today, patient presents to clinic accompanied by his wife and explains that during the pandemic he had a hard time with oral hygiene due to anxiety and since then has followed with multiple dentists about his teeth and has been told that he likely had acid reflux damage.  Also tells me that he was experiencing daily symptoms of reflux whenever he would lay down at all over the past few years, over the past month he started over-the-counter Omeprazole 20 mg before bed and his symptoms have completely vanished.  He is really just here to discuss if he can take this medication long-term.  Does admit to daily soda drinking in excess.  Associated symptoms include a decrease in appetite but he has not been losing any weight.    Denies fever, chills, abdominal pain, nausea, vomiting or symptoms that awaken him from sleep.  Past Medical History:  Diagnosis Date   Alcohol abuse    Allergy    Anxiety    Anxiety and depression    Benzodiazepine dependence ([place])    Depression     Difficulty urinating    cant urinate in public places (restrooms)   Diverticulitis    Opioid dependence ([place])    Pilonidal cyst    Seasonal allergies    Skull fracture ([place]) 2004   Trauma   Tension headache    Tubular adenoma of colon     Past Surgical History:  Procedure Laterality Date   ORIF right 3rd finger  2012   Dr. Fredna Dow - see hospital notes.   pilonidal cystectomy  08/01/2011   RADIOFREQUENCY ABLATION NERVES  12/10/2012   VASECTOMY  04/2010   WISDOM TOOTH EXTRACTION      Current Outpatient Medications  Medication Sig Dispense Refill   clonazePAM (KLONOPIN) 1 MG tablet Take 1-2 mg by mouth 3 (three) times daily.     cyclobenzaprine (FLEXERIL) 5 MG tablet Take 1 tablet (5 mg total) by mouth 3 (three) times daily as needed for muscle spasms. 90 tablet 6   Dextromethorphan-quiNIDine (NUEDEXTA) 20-10 MG capsule Take 1 capsule by mouth 2 (two) times daily.     gabapentin (NEURONTIN) 800 MG tablet Take 800 mg by mouth 4 (four) times daily.     hydrOXYzine (VISTARIL) 25 MG capsule Take 75 mg by mouth at bedtime as needed for anxiety.      lamoTRIgine (LAMICTAL) 150 MG tablet Take 150 mg by mouth daily.     propranolol (INDERAL) 20 MG tablet Take 20 mg by mouth 4 (four) times daily.  No current facility-administered medications for this visit.    Allergies as of 11/14/2022 - Review Complete 11/14/2022  Allergen Reaction Noted   Avelox [moxifloxacin hcl in nacl] Nausea And Vomiting 06/02/2011   Fluoxetine  03/09/2011   Nsaids Other (See Comments) 12/04/2016   Other Nausea And Vomiting 09/07/2014   Moxifloxacin Nausea And Vomiting 09/29/2010    Family History  Problem Relation Age of Onset   Depression Mother    Anxiety disorder Mother    Alcohol abuse Mother    Hypertension Father    Hyperlipidemia Father    Colon polyps Paternal Grandfather    Cancer Paternal Aunt        breast   Breast cancer Paternal Aunt    Colon cancer Neg Hx    Esophageal cancer Neg Hx     Pancreatic cancer Neg Hx    Stomach cancer Neg Hx    Rectal cancer Neg Hx     Social History   Socioeconomic History   Marital status: Married    Spouse name: Not on file   Number of children: 0   Years of education: 12   Highest education level: Not on file  Occupational History   Occupation: unemployed  Tobacco Use   Smoking status: Former    Packs/day: 1.50    Years: 7.00    Total pack years: 10.50    Types: Cigarettes    Quit date: 12/04/2006    Years since quitting: 15.9   Smokeless tobacco: Never   Tobacco comments:    quit 2 or more years  Vaping Use   Vaping Use: Every day   Substances: Nicotine  Substance and Sexual Activity   Alcohol use: No    Alcohol/week: 0.0 standard drinks of alcohol   Drug use: Yes    Types: Marijuana    Comment: last smoke a "few days ago"   Sexual activity: Yes    Partners: Female    Birth control/protection: None  Other Topics Concern   Not on file  Social History Narrative   HSG, attending [employer] (09/2010). Married June 2011.No children. NO history of physical or sexual abuse. Not working at this time.             Social Determinants of Health   Financial Resource Strain: Not on file  Food Insecurity: Not on file  Transportation Needs: Not on file  Physical Activity: Not on file  Stress: Not on file  Social Connections: Not on file  Intimate Partner Violence: Not on file    Review of Systems:    Constitutional: No weight loss, fever or chills Cardiovascular: No chest pain  Respiratory: No SOB Gastrointestinal: See HPI and otherwise negative   Physical Exam:  Vital signs: BP 126/84   Pulse 61   Ht 6' (1.829 m)   Wt 209 lb 6 oz (95 kg)   BMI 28.40 kg/m    Constitutional:   Pleasant Caucasian male appears to be in NAD, Well developed, Well nourished, alert and cooperative Respiratory: Respirations even and unlabored. Lungs clear to auscultation bilaterally.   No wheezes, crackles, or rhonchi.  Cardiovascular:  Normal S1, S2. No MRG. Regular rate and rhythm. No peripheral edema, cyanosis or pallor.  Gastrointestinal:  Soft, nondistended, nontender. No rebound or guarding. Normal bowel sounds. No appreciable masses or hepatomegaly. Rectal:  Not performed.  Psychiatric: Oriented to person, place and time. Demonstrates good judgement and reason without abnormal affect or behaviors.  No recent labs or imaging.  Assessment: 1.  GERD: Daily symptoms over the past 2 years but started to damage the enamel on his teeth per his dentist, symptoms completely controlled on Omeprazole 20 mg daily over the past month; likely gastritis +/- functional dyspepsia and diet likely contributing 2.  History of adenomatous polyps: Last colonoscopy in 2021 was normal, repeat recommended at age 36  Plan: 1.  Prescribed Omeprazole 20 mg 30-60 minutes before dinner #90 with 3 refills. 2.  Patient will call if he has any breakthrough symptoms on this medication dosing, if he does or experiences any other problems could consider an EGD at some point. 3.  Reviewed antireflux diet and lifestyle modifications.  Likely cutting back on his sodas and losing a little bit of weight would also help his symptoms. 4.  Patient to follow in clinic in a year or sooner if necessary.  Ellouise Newer, PA-C [facility] 11/14/2022, 9:04 AM  Cc: Henson, Vickie L, NP-C

## 2022-11-28 ENCOUNTER — Ambulatory Visit: Payer: No Typology Code available for payment source | Admitting: Internal Medicine

## 2022-12-12 ENCOUNTER — Encounter: Payer: No Typology Code available for payment source | Admitting: Family Medicine

## 2023-02-08 ENCOUNTER — Encounter: Payer: Self-pay | Admitting: Family Medicine

## 2023-02-08 ENCOUNTER — Ambulatory Visit (INDEPENDENT_AMBULATORY_CARE_PROVIDER_SITE_OTHER): Payer: No Typology Code available for payment source | Admitting: Family Medicine

## 2023-02-08 VITALS — BP 116/84 | HR 94 | Temp 97.6°F | Ht 72.0 in | Wt 203.0 lb

## 2023-02-08 DIAGNOSIS — F419 Anxiety disorder, unspecified: Secondary | ICD-10-CM | POA: Diagnosis not present

## 2023-02-08 DIAGNOSIS — Z114 Encounter for screening for human immunodeficiency virus [HIV]: Secondary | ICD-10-CM

## 2023-02-08 DIAGNOSIS — Z0001 Encounter for general adult medical examination with abnormal findings: Secondary | ICD-10-CM

## 2023-02-08 DIAGNOSIS — E7849 Other hyperlipidemia: Secondary | ICD-10-CM

## 2023-02-08 DIAGNOSIS — F32A Depression, unspecified: Secondary | ICD-10-CM

## 2023-02-08 DIAGNOSIS — Z1159 Encounter for screening for other viral diseases: Secondary | ICD-10-CM

## 2023-02-08 LAB — CBC WITH DIFFERENTIAL/PLATELET
Basophils Absolute: 0.1 10*3/uL (ref 0.0–0.1)
Basophils Relative: 0.5 % (ref 0.0–3.0)
Eosinophils Absolute: 0.6 10*3/uL (ref 0.0–0.7)
Eosinophils Relative: 5 % (ref 0.0–5.0)
HCT: 44.4 % (ref 39.0–52.0)
Hemoglobin: 15 g/dL (ref 13.0–17.0)
Lymphocytes Relative: 42.9 % (ref 12.0–46.0)
Lymphs Abs: 5.1 10*3/uL — ABNORMAL HIGH (ref 0.7–4.0)
MCHC: 33.7 g/dL (ref 30.0–36.0)
MCV: 87.1 fl (ref 78.0–100.0)
Monocytes Absolute: 1.1 10*3/uL — ABNORMAL HIGH (ref 0.1–1.0)
Monocytes Relative: 9.2 % (ref 3.0–12.0)
Neutro Abs: 5.1 10*3/uL (ref 1.4–7.7)
Neutrophils Relative %: 42.4 % — ABNORMAL LOW (ref 43.0–77.0)
Platelets: 354 10*3/uL (ref 150.0–400.0)
RBC: 5.1 Mil/uL (ref 4.22–5.81)
RDW: 14.4 % (ref 11.5–15.5)
WBC: 12 10*3/uL — ABNORMAL HIGH (ref 4.0–10.5)

## 2023-02-08 LAB — COMPREHENSIVE METABOLIC PANEL
ALT: 82 U/L — ABNORMAL HIGH (ref 0–53)
AST: 40 U/L — ABNORMAL HIGH (ref 0–37)
Albumin: 4.2 g/dL (ref 3.5–5.2)
Alkaline Phosphatase: 76 U/L (ref 39–117)
BUN: 13 mg/dL (ref 6–23)
CO2: 27 mEq/L (ref 19–32)
Calcium: 9.7 mg/dL (ref 8.4–10.5)
Chloride: 102 mEq/L (ref 96–112)
Creatinine, Ser: 0.88 mg/dL (ref 0.40–1.50)
GFR: 108.29 mL/min (ref >60.00)
Glucose, Bld: 108 mg/dL — ABNORMAL HIGH (ref 70–99)
Potassium: 4.1 mEq/L (ref 3.5–5.1)
Sodium: 139 mEq/L (ref 135–145)
Total Bilirubin: 0.3 mg/dL (ref 0.2–1.2)
Total Protein: 7 g/dL (ref 6.0–8.3)

## 2023-02-08 LAB — LIPID PANEL
Cholesterol: 308 mg/dL — ABNORMAL HIGH (ref 0–200)
HDL: 28.5 mg/dL — ABNORMAL LOW (ref >39.00)
Total CHOL/HDL Ratio: 11
Triglycerides: 813 mg/dL — ABNORMAL HIGH (ref 0.0–149.0)

## 2023-02-08 LAB — TSH: TSH: 1.14 u[IU]/mL (ref 0.35–5.50)

## 2023-02-08 LAB — LDL CHOLESTEROL, DIRECT: Direct LDL: 97 mg/dL

## 2023-02-08 NOTE — Progress Notes (Signed)
Subjective:    Patient ID: [name], male    DOB: 08-Aug-1981, 42 y.o.   MRN: [id number]  HPI Chief Complaint  Patient presents with   Annual Exam    fasting   He is new to the practice and here for a complete physical exam.   Other providers: [facility]  Dr. Tamala Julian- Sports medicine  Dr. Tomi Likens - hx of traumatic brain injury  Dr. Toy Care- psychiatrist Elberta Fortis at [place] and depression- managed by Dr. Toy Care and therapist.   Medications recently changed by Dr Toy Care  Recent therapy EMDR caused a bad reaction.  States since Covid he has been a solitary hermit basically.  He has not seen his therapist recently.    Social history: Lives with wife, currently not working   Diet: snacks, poor appetite  Exercise:   Immunizations:UTD  Health maintenance:   Colonoscopy: due again in 2026  Last PSA: N/A Last Dental Exam: UTD  Last Eye Exam: overdue     Reviewed allergies, medications, past medical, surgical, family, and social history.    Review of Systems Review of Systems Constitutional: -fever, -chills, -sweats, -unexpected weight change,-fatigue ENT: -runny nose, -ear pain, -sore throat Cardiology:  -chest pain, -palpitations, -edema Respiratory: -cough, -shortness of breath, -wheezing Gastroenterology: -abdominal pain, -nausea, -vomiting, -diarrhea, -constipation  Hematology: -bleeding or bruising problems Musculoskeletal: -arthralgias, -myalgias, -joint swelling, -back pain Ophthalmology: -vision changes Urology: -dysuria, -difficulty urinating, -hematuria, -urinary frequency, -urgency Neurology: -headache, -weakness, -tingling, -numbness       Objective:   Physical Exam BP 116/84 (BP Location: Left Arm, Patient Position: Sitting, Cuff Size: Large)   Pulse 94   Temp 97.6 F (36.4 C) (Temporal)   Ht 6' (1.829 m)   Wt 203 lb (92.1 kg)   SpO2 98%   BMI 27.53 kg/m   General Appearance:    Alert, cooperative, no  distress, appears stated age  Head:    Normocephalic, without obvious abnormality, atraumatic  Eyes:    PERRL, conjunctiva/corneas clear, EOM's intact  Ears:    Normal TM's and external ear canals  Nose:   Nares normal, mucosa normal, no drainage   Throat:   Lips, mucosa, and tongue normal  Neck:   Supple, no lymphadenopathy;  thyroid:  no   enlargement/tenderness/nodules; no JVD  Back:    Spine nontender, no curvature, ROM normal, no CVA     tenderness  Lungs:     Clear to auscultation bilaterally without wheezes, rales or     ronchi; respirations unlabored  Chest Wall:    No tenderness or deformity   Heart:    Regular rate and rhythm, S1 and S2 normal, no murmur, rub   or gallop  Breast Exam:    No chest wall tenderness, masses or gynecomastia  Abdomen:     Soft, non-tender, nondistended, normoactive bowel sounds,    no masses, no hepatosplenomegaly  Genitalia:    Deferred   Rectal:   Deferred due to age <40 and lack of symptoms  Extremities:   No clubbing, cyanosis or edema  Pulses:   2+ and symmetric all extremities  Skin:   Skin color, texture, turgor normal, no rashes or lesions  Lymph nodes:   Cervical, supraclavicular, and axillary nodes normal  Neurologic:   CNII-XII intact, normal strength, sensation and gait          Psych:   Normal mood, affect, hygiene and grooming.         Assessment &  Plan:  Encounter for general adult medical examination with abnormal findings - Plan: Comprehensive metabolic panel, CBC w/Diff  Other hyperlipidemia - Plan: Lipid panel  Anxiety and depression - Plan: TSH  Screening for HIV without presence of risk factors - Plan: HIV Antibody (routine testing w rflx)  Need for hepatitis C screening test - Plan: Hepatitis C antibody  He is here with his wife for fasting CPE.  Reports being in his usual state of health. Preventive health care reviewed.  Recommend regular dental and eye exams.  Counseling on healthy lifestyle including diet and  exercise.  Screening for HIV and hepatitis C per guidelines. Check lipid panel due to history of dyslipidemia. Discussed that his PHQ-9 and GAD-7 are both abnormal.  Encouraged him to reach out to his therapist.  Dr. Toy Care is his psychiatrist and working on medications.  Does not appear to be in any danger to himself or others. Follow-up pending labs or in 1 year.

## 2023-02-08 NOTE — Patient Instructions (Addendum)
Please go downstairs for labs before you leave today.  I recommend calling and scheduling with your counselor.  We will be in touch with your lab results and with recommendations.    Preventive Care 53-40 Years Old, Male Preventive care refers to lifestyle choices and visits with your health care provider that can promote health and wellness. Preventive care visits are also called wellness exams. What can I expect for my preventive care visit? Counseling During your preventive care visit, your health care provider may ask about your: Medical history, including: Past medical problems. Family medical history. Current health, including: Emotional well-being. Home life and relationship well-being. Sexual activity. Lifestyle, including: Alcohol, nicotine or tobacco, and drug use. Access to firearms. Diet, exercise, and sleep habits. Safety issues such as seatbelt and bike helmet use. Sunscreen use. Work and work Statistician. Physical exam Your health care provider may check your: Height and weight. These may be used to calculate your BMI (body mass index). BMI is a measurement that tells if you are at a healthy weight. Waist circumference. This measures the distance around your waistline. This measurement also tells if you are at a healthy weight and may help predict your risk of certain diseases, such as type 2 diabetes and high blood pressure. Heart rate and blood pressure. Body temperature. Skin for abnormal spots. What immunizations do I need?  Vaccines are usually given at various ages, according to a schedule. Your health care provider will recommend vaccines for you based on your age, medical history, and lifestyle or other factors, such as travel or where you work. What tests do I need? Screening Your health care provider may recommend screening tests for certain conditions. This may include: Lipid and cholesterol levels. Diabetes screening. This is done by checking your  blood sugar (glucose) after you have not eaten for a while (fasting). Hepatitis B test. Hepatitis C test. HIV (human immunodeficiency virus) test. STI (sexually transmitted infection) testing, if you are at risk. Talk with your health care provider about your test results, treatment options, and if necessary, the need for more tests. Follow these instructions at home: Eating and drinking  Eat a healthy diet that includes fresh fruits and vegetables, whole grains, lean protein, and low-fat dairy products. Drink enough fluid to keep your urine pale yellow. Take vitamin and mineral supplements as recommended by your health care provider. Do not drink alcohol if your health care provider tells you not to drink. If you drink alcohol: Limit how much you have to 0-2 drinks a day. Know how much alcohol is in your drink. In the [place] one drink equals one 12 oz bottle of beer (355 mL), one 5 oz glass of wine (148 mL), or one 1 oz glass of hard liquor (44 mL). Lifestyle Brush your teeth every morning and night with fluoride toothpaste. Floss one time each day. Exercise for at least 30 minutes 5 or more days each week. Do not use any products that contain nicotine or tobacco. These products include cigarettes, chewing tobacco, and vaping devices, such as e-cigarettes. If you need help quitting, ask your health care provider. Do not use drugs. If you are sexually active, practice safe sex. Use a condom or other form of protection to prevent STIs. Find healthy ways to manage stress, such as: Meditation, yoga, or listening to music. Journaling. Talking to a trusted person. Spending time with friends and family. Minimize exposure to UV radiation to reduce your risk of skin cancer. Safety Always wear your seat  belt while driving or riding in a vehicle. Do not drive: If you have been drinking alcohol. Do not ride with someone who has been drinking. If you have been using any mind-altering substances  or drugs. While texting. When you are tired or distracted. Wear a helmet and other protective equipment during sports activities. If you have firearms in your house, make sure you follow all gun safety procedures. Seek help if you have been physically or sexually abused. What's next? Go to your health care provider once a year for an annual wellness visit. Ask your health care provider how often you should have your eyes and teeth checked. Stay up to date on all vaccines. This information is not intended to replace advice given to you by your health care provider. Make sure you discuss any questions you have with your health care provider. Document Revised: 05/18/2021 Document Reviewed: 05/18/2021 Elsevier Patient Education  [place].

## 2023-02-08 NOTE — Progress Notes (Signed)
His triglycerides are severely elevated. This puts him at risk for pancreatitis, heart disease and other health issues. His liver enzymes are also elevated. This will need to be addressed. Please ask him to come in fasting in 2 weeks for f/u visit and additional labs.  Limit foods high in fast, processed foods, junk food and fried foods.

## 2023-02-09 LAB — HEPATITIS C ANTIBODY: Hepatitis C Ab: NONREACTIVE

## 2023-02-09 LAB — HIV ANTIBODY (ROUTINE TESTING W REFLEX): HIV 1&2 Ab, 4th Generation: NONREACTIVE

## 2023-02-21 NOTE — Progress Notes (Unsigned)
   Shirlyn Goltz, PhD, LAT, ATC acting as a scribe for Lynne Leader, MD.  [name] is a 42 y.o. male who presents to [place] at [facility] today for left foot pain.  Patient was previously seen by Dr. Tamala Julian on 02/21/2022 for spondylolisthesis at L5-S1.  Today, patient reports left foot pain ongoing***.  MOI:***Patient locates pain to ***.  Left foot swelling: Aggravates: Treatments tried:  Pertinent review of systems: ***  Relevant historical information: ***   Exam:  There were no vitals taken for this visit. General: Well Developed, well nourished, and in no acute distress.   MSK: ***    Lab and Radiology Results No results found for this or any previous visit (from the past 72 hour(s)). No results found.     Assessment and Plan: 42 y.o. male with ***   PDMP not reviewed this encounter. No orders of the defined types were placed in this encounter.  No orders of the defined types were placed in this encounter.    Discussed warning signs or symptoms. Please see discharge instructions. Patient expresses understanding.   ***

## 2023-02-22 ENCOUNTER — Ambulatory Visit: Payer: No Typology Code available for payment source | Admitting: Family Medicine

## 2023-02-22 ENCOUNTER — Encounter: Payer: Self-pay | Admitting: Family Medicine

## 2023-02-22 VITALS — BP 104/76 | HR 78 | Ht 72.0 in | Wt 205.2 lb

## 2023-02-22 VITALS — BP 126/86 | HR 88 | Temp 97.8°F | Ht 72.0 in | Wt 204.0 lb

## 2023-02-22 DIAGNOSIS — R748 Abnormal levels of other serum enzymes: Secondary | ICD-10-CM | POA: Diagnosis not present

## 2023-02-22 DIAGNOSIS — D72820 Lymphocytosis (symptomatic): Secondary | ICD-10-CM

## 2023-02-22 DIAGNOSIS — E781 Pure hyperglyceridemia: Secondary | ICD-10-CM | POA: Diagnosis not present

## 2023-02-22 DIAGNOSIS — M79672 Pain in left foot: Secondary | ICD-10-CM | POA: Diagnosis not present

## 2023-02-22 DIAGNOSIS — M7662 Achilles tendinitis, left leg: Secondary | ICD-10-CM

## 2023-02-22 DIAGNOSIS — R7301 Impaired fasting glucose: Secondary | ICD-10-CM

## 2023-02-22 LAB — BASIC METABOLIC PANEL
BUN: 12 mg/dL (ref 6–23)
CO2: 28 mEq/L (ref 19–32)
Calcium: 9.3 mg/dL (ref 8.4–10.5)
Chloride: 100 mEq/L (ref 96–112)
Creatinine, Ser: 0.84 mg/dL (ref 0.40–1.50)
GFR: 109.79 mL/min (ref >60.00)
Glucose, Bld: 122 mg/dL — ABNORMAL HIGH (ref 70–99)
Potassium: 4.1 mEq/L (ref 3.5–5.1)
Sodium: 136 mEq/L (ref 135–145)

## 2023-02-22 LAB — LIPID PANEL
Cholesterol: 316 mg/dL — ABNORMAL HIGH (ref 0–200)
HDL: 27.1 mg/dL — ABNORMAL LOW (ref >39.00)
Total CHOL/HDL Ratio: 12
Triglycerides: 901 mg/dL — ABNORMAL HIGH (ref 0.0–149.0)

## 2023-02-22 LAB — HEPATIC FUNCTION PANEL
ALT: 92 U/L — ABNORMAL HIGH (ref 0–53)
AST: 43 U/L — ABNORMAL HIGH (ref 0–37)
Albumin: 4.5 g/dL (ref 3.5–5.2)
Alkaline Phosphatase: 81 U/L (ref 39–117)
Bilirubin, Direct: 0.1 mg/dL (ref 0.0–0.3)
Total Bilirubin: 0.4 mg/dL (ref 0.2–1.2)
Total Protein: 7.3 g/dL (ref 6.0–8.3)

## 2023-02-22 LAB — CBC WITH DIFFERENTIAL/PLATELET
Basophils Absolute: 0.1 10*3/uL (ref 0.0–0.1)
Basophils Relative: 0.9 % (ref 0.0–3.0)
Eosinophils Absolute: 0.5 10*3/uL (ref 0.0–0.7)
Eosinophils Relative: 4.7 % (ref 0.0–5.0)
HCT: 43.4 % (ref 39.0–52.0)
Hemoglobin: 14.8 g/dL (ref 13.0–17.0)
Lymphocytes Relative: 38.9 % (ref 12.0–46.0)
Lymphs Abs: 4.1 10*3/uL — ABNORMAL HIGH (ref 0.7–4.0)
MCHC: 34.1 g/dL (ref 30.0–36.0)
MCV: 87.4 fl (ref 78.0–100.0)
Monocytes Absolute: 1 10*3/uL (ref 0.1–1.0)
Monocytes Relative: 9.6 % (ref 3.0–12.0)
Neutro Abs: 4.8 10*3/uL (ref 1.4–7.7)
Neutrophils Relative %: 45.9 % (ref 43.0–77.0)
Platelets: 368 10*3/uL (ref 150.0–400.0)
RBC: 4.97 Mil/uL (ref 4.22–5.81)
RDW: 14.5 % (ref 11.5–15.5)
WBC: 10.5 10*3/uL (ref 4.0–10.5)

## 2023-02-22 LAB — LDL CHOLESTEROL, DIRECT: Direct LDL: 105 mg/dL

## 2023-02-22 LAB — HEMOGLOBIN A1C: Hgb A1c MFr Bld: 6 % (ref 4.6–6.5)

## 2023-02-22 MED ORDER — OMEGA-3-ACID ETHYL ESTERS 1 G PO CAPS: 2.0000 g | ORAL_CAPSULE | Freq: Two times a day (BID) | ORAL | 2 refills | Status: DC

## 2023-02-22 MED ORDER — NITROGLYCERIN 0.2 MG/HR TD PT24: MEDICATED_PATCH | TRANSDERMAL | 1 refills | Status: DC

## 2023-02-22 NOTE — Patient Instructions (Addendum)
Thank you for coming in today.   Please complete the exercises that the athletic trainer went over with you:  View at [email or web address]-[email or web address] using code: [email or web address]  Recheck in 1 month;   Try a heel lift on amazon.   Nitroglycerin Protocol  Apply 1/4 nitroglycerin patch to affected area daily. Change position of patch within the affected area every 24 hours. You may experience a headache during the first 1-2 weeks of using the patch, these should subside. If you experience headaches after beginning nitroglycerin patch treatment, you may take your preferred over the counter pain reliever. Another side effect of the nitroglycerin patch is skin irritation or rash related to patch adhesive. Please notify our office if you develop more severe headaches or rash, and stop the patch. Tendon healing with nitroglycerin patch may require 12 to 24 weeks depending on the extent of injury. Men should not use if taking Viagra, Cialis, or Levitra.  Do not use if you have migraines or rosacea.

## 2023-02-22 NOTE — Progress Notes (Signed)
Please make sure he gets the following information. Schedule him for a 3  month follow up, fasting.  Your triglycerides are higher. I sent in a medication for you to take to help lower this condition and help reduce your risk for heart disease, heart attack and stroke. Follow up 3 months after taking this and before you need a refill. Also, your liver enzymes are still elevated so I will order an ultrasound as discussed and they will call you to schedule this.  You have prediabetes so cut back on sugar, sweets, and carbohydrates such as potatoes, rice, pasta, and bread.

## 2023-02-22 NOTE — Progress Notes (Signed)
Subjective:     Patient ID: [name], male    DOB: 11-01-1981, 42 y.o.   MRN: [id number]  Chief Complaint  Patient presents with   Follow-up    Fasting 2 week f/u for elevated liver enzymes     HPI Patient is in today for follow up on abnormal labs.   Hypertriglyceridemia, elevated liver enzymes and lymphocytosis.  Diet high in fat. Drinks milk nightly, 2% milk.   No alcohol.     There are no preventive care reminders to display for this patient.  Past Medical History:  Diagnosis Date   Alcohol abuse    Allergy    Anxiety    Anxiety and depression    Benzodiazepine dependence ([place])    Depression    Difficulty urinating    cant urinate in public places (restrooms)   Diverticulitis    GERD (gastroesophageal reflux disease)    Opioid dependence ([place])    Pilonidal cyst    Seasonal allergies    Skull fracture ([place]) 2004   Trauma   Tension headache    Tubular adenoma of colon     Past Surgical History:  Procedure Laterality Date   ORIF right 3rd finger  12/04/2010   Dr. Fredna Dow - see hospital notes.   pilonidal cystectomy  08/01/2011   RADIOFREQUENCY ABLATION NERVES  12/10/2012   VASECTOMY  04/03/2010   WISDOM TOOTH EXTRACTION      Family History  Problem Relation Age of Onset   Depression Mother    Anxiety disorder Mother    Alcohol abuse Mother    Hypertension Father    Hyperlipidemia Father    Colon polyps Paternal Grandfather    Cancer Paternal Aunt        breast   Breast cancer Paternal Aunt    Colon cancer Neg Hx    Esophageal cancer Neg Hx    Pancreatic cancer Neg Hx    Stomach cancer Neg Hx    Rectal cancer Neg Hx     Social History   Socioeconomic History   Marital status: Married    Spouse name: Not on file   Number of children: 0   Years of education: 12   Highest education level: Some college, no degree  Occupational History   Occupation: unemployed  Tobacco Use   Smoking status: Every Day    Types: E-cigarettes    Smokeless tobacco: Never   Tobacco comments:    quit 2 or more years  Vaping Use   Vaping Use: Every day   Substances: Nicotine  Substance and Sexual Activity   Alcohol use: No   Drug use: Yes    Types: Marijuana    Comment: last smoke a "few days ago"   Sexual activity: Not Currently    Partners: Female    Birth control/protection: Surgical, None    Comment: vasectomy  Other Topics Concern   Not on file  Social History Narrative   HSG, attending [employer] (09/2010). Married June 2011.No children. NO history of physical or sexual abuse. Not working at this time.             Social Determinants of Health   Financial Resource Strain: Low Risk  (02/22/2023)   Overall Financial Resource Strain (CARDIA)    Difficulty of Paying Living Expenses: Not very hard  Food Insecurity: No Food Insecurity (02/22/2023)   Hunger Vital Sign    Worried About Running Out of Food in the Last Year: Never true  Ran Out of Food in the Last Year: Never true  Transportation Needs: No Transportation Needs (02/22/2023)   PRAPARE - Hydrologist (Medical): No    Lack of Transportation (Non-Medical): No  Physical Activity: Unknown (02/22/2023)   Exercise Vital Sign    Days of Exercise per Week: 0 days    Minutes of Exercise per Session: Not on file  Stress: Patient Declined (02/22/2023)   [place]    Feeling of Stress : Patient declined  Social Connections: Unknown (02/22/2023)   Social Connection and Isolation Panel [NHANES]    Frequency of Communication with Friends and Family: Patient declined    Frequency of Social Gatherings with Friends and Family: Patient declined    Attends Religious Services: Never    Marine scientist or Organizations: No    Attends Music therapist: Not on file    Marital Status: Married  Human resources officer Violence: Not on file    Outpatient Medications Prior to  Visit  Medication Sig Dispense Refill   clonazePAM (KLONOPIN) 1 MG tablet Take 1-2 mg by mouth 3 (three) times daily.     cyclobenzaprine (FLEXERIL) 5 MG tablet Take 1 tablet (5 mg total) by mouth 3 (three) times daily as needed for muscle spasms. 90 tablet 6   Dextromethorphan-quiNIDine (NUEDEXTA) 20-10 MG capsule Take 1 capsule by mouth 2 (two) times daily.     gabapentin (NEURONTIN) 800 MG tablet Take 800 mg by mouth 4 (four) times daily.     hydrOXYzine (VISTARIL) 25 MG capsule Take 75 mg by mouth at bedtime as needed for anxiety.      lamoTRIgine (LAMICTAL) 150 MG tablet Take 150 mg by mouth daily.     omeprazole (PRILOSEC) 20 MG capsule Take 1 capsule (20 mg total) by mouth daily. 90 capsule 3   propranolol (INDERAL) 20 MG tablet Take 20 mg by mouth 4 (four) times daily.     No facility-administered medications prior to visit.    Allergies  Allergen Reactions   Avelox [Moxifloxacin Hcl In Nacl] Nausea And Vomiting   Fluoxetine     Intolerant of all SSRI's, NE drugs   Nsaids Other (See Comments)   Other Nausea And Vomiting    Strong antibiotics by mouth / antidepressants   Moxifloxacin Nausea And Vomiting    Review of Systems  Constitutional:  Negative for chills and fever.  Respiratory:  Negative for shortness of breath.   Cardiovascular:  Negative for chest pain, palpitations and leg swelling.  Gastrointestinal:  Negative for abdominal pain, constipation, diarrhea, nausea and vomiting.  Genitourinary:  Negative for dysuria, frequency and urgency.  Neurological:  Negative for dizziness.       Objective:    Physical Exam Constitutional:      General: He is not in acute distress.    Appearance: He is not ill-appearing.  Eyes:     Extraocular Movements: Extraocular movements intact.     Conjunctiva/sclera: Conjunctivae normal.  Cardiovascular:     Rate and Rhythm: Normal rate.  Pulmonary:     Effort: Pulmonary effort is normal.  Musculoskeletal:     Cervical back:  Normal range of motion and neck supple.  Skin:    General: Skin is warm and dry.  Neurological:     General: No focal deficit present.     Mental Status: He is alert and oriented to person, place, and time.  Psychiatric:  Mood and Affect: Mood normal.        Behavior: Behavior normal.        Thought Content: Thought content normal.     BP 126/86 (BP Location: Left Arm, Patient Position: Sitting, Cuff Size: Large)   Pulse 88   Temp 97.8 F (36.6 C) (Temporal)   Ht 6' (1.829 m)   Wt 204 lb (92.5 kg)   SpO2 98%   BMI 27.67 kg/m  Wt Readings from Last 3 Encounters:  02/22/23 205 lb 3.2 oz (93.1 kg)  02/22/23 204 lb (92.5 kg)  02/08/23 203 lb (92.1 kg)       Assessment & Plan:   Problem List Items Addressed This Visit       Other   Leukocytosis   Relevant Orders   CBC with Differential/Platelet (Completed)   Other Visit Diagnoses     Hypertriglyceridemia    -  Primary   Relevant Medications   omega-3 acid ethyl esters (LOVAZA) 1 g capsule   Other Relevant Orders   Lipid panel (Completed)   Elevated liver enzymes       Relevant Orders   Hepatic function panel (Completed)   Acute Hep Panel & Hep B Surface Ab   US Abdomen Limited RUQ (LIVER/GB)   Elevated fasting glucose       Relevant Orders   Hemoglobin A1c (Completed)   Basic metabolic panel (Completed)      Counseling on healthy low fat, low triglyceride diet. Lovaza prescribed.  Prediabetes- new problem. Cut back on sugar and carbohydrates and follow up in 3 months fasting.  Order RUQ [place] for elevated liver enzymes. Acute hepatitis panel ordered.  Lymphocytes trending to normal with normal WBC.   I am having [name] start on omega-3 acid ethyl esters. I am also having him maintain his hydrOXYzine, clonazePAM, Nuedexta, cyclobenzaprine, gabapentin, lamoTRIgine, propranolol, and omeprazole.  Meds ordered this encounter  Medications   omega-3 acid ethyl esters (LOVAZA) 1 g capsule    Sig: Take  2 capsules (2 g total) by mouth 2 (two) times daily.    Dispense:  120 capsule    Refill:  2    Order Specific Question:   Supervising Provider    Answer:   Pricilla Holm A [id number]

## 2023-02-23 LAB — ACUTE HEP PANEL AND HEP B SURFACE AB
HEPATITIS C ANTIBODY REFILL$(REFL): NONREACTIVE
Hep A IgM: NONREACTIVE
Hep B C IgM: NONREACTIVE
Hepatitis B Surface Ag: NONREACTIVE

## 2023-02-23 LAB — REFLEX TIQ

## 2023-03-28 ENCOUNTER — Other Ambulatory Visit: Payer: No Typology Code available for payment source

## 2023-03-28 NOTE — Progress Notes (Unsigned)
   Rubin Payor, PhD, LAT, ATC acting as a scribe for Clementeen Graham, MD.  [name] is a 42 y.o. male who presents to [facility] today for 6-month f/u L heel pain thought to be due to Achilles tendonopathy. Pt was last seen by Dr. Denyse Amass on 02/22/23 and was taught HEP and prescribed nitro patches. Today, pt reports ***   Pertinent review of systems: ***  Relevant historical information: ***   Exam:  There were no vitals taken for this visit. General: Well Developed, well nourished, and in no acute distress.   MSK: ***    Lab and Radiology Results No results found for this or any previous visit (from the past 72 hour(s)). No results found.     Assessment and Plan: 42 y.o. male with ***   PDMP not reviewed this encounter. No orders of the defined types were placed in this encounter.  No orders of the defined types were placed in this encounter.    Discussed warning signs or symptoms. Please see discharge instructions. Patient expresses understanding.   ***

## 2023-03-29 ENCOUNTER — Ambulatory Visit: Payer: No Typology Code available for payment source | Admitting: Family Medicine

## 2023-03-29 VITALS — BP 112/84 | HR 97 | Ht 72.0 in | Wt 206.0 lb

## 2023-03-29 DIAGNOSIS — M7662 Achilles tendinitis, left leg: Secondary | ICD-10-CM | POA: Diagnosis not present

## 2023-03-29 MED ORDER — NITROGLYCERIN 0.2 MG/HR TD PT24: MEDICATED_PATCH | TRANSDERMAL | 6 refills | Status: DC

## 2023-03-29 NOTE — Patient Instructions (Signed)
Thank you for coming in today.   Continue the NTG patch.   Recheck in 3 months.   Let me know sooner if you have a problem.

## 2023-04-11 ENCOUNTER — Encounter: Payer: Self-pay | Admitting: Family Medicine

## 2023-04-11 ENCOUNTER — Ambulatory Visit
Admission: RE | Admit: 2023-04-11 | Discharge: 2023-04-11 | Disposition: A | Discharge status: Home / Self Care | Payer: No Typology Code available for payment source | Source: Ambulatory Visit | Attending: Family Medicine | Admitting: Family Medicine

## 2023-04-11 DIAGNOSIS — K760 Fatty (change of) liver, not elsewhere classified: Secondary | ICD-10-CM | POA: Insufficient documentation

## 2023-04-11 DIAGNOSIS — R748 Abnormal levels of other serum enzymes: Secondary | ICD-10-CM

## 2023-04-11 HISTORY — DX: Fatty (change of) liver, not elsewhere classified: K76.0

## 2023-05-11 ENCOUNTER — Encounter: Payer: Self-pay | Admitting: Adult Health

## 2023-05-11 ENCOUNTER — Ambulatory Visit: Payer: No Typology Code available for payment source | Admitting: Adult Health

## 2023-05-11 VITALS — BP 120/70 | HR 74 | Temp 98.4°F | Ht 72.0 in | Wt 202.0 lb

## 2023-05-11 DIAGNOSIS — L600 Ingrowing nail: Secondary | ICD-10-CM | POA: Diagnosis not present

## 2023-05-11 MED ORDER — DOXYCYCLINE HYCLATE 100 MG PO CAPS: 100.0000 mg | ORAL_CAPSULE | Freq: Two times a day (BID) | ORAL | 0 refills | Status: DC

## 2023-05-11 NOTE — Progress Notes (Signed)
Subjective:    Patient ID: [name], male    DOB: June 29, 1981, 42 y.o.   MRN: [id number]  HPI  42 year old male who  has a past medical history of Alcohol abuse, Allergy, Anxiety, Anxiety and depression, Benzodiazepine dependence (HCC), Depression, Difficulty urinating, Diverticulitis, Fatty liver (04/11/2023), GERD (gastroesophageal reflux disease), Opioid dependence (HCC), Pilonidal cyst, Seasonal allergies, Skull fracture (H[facility]) (2004), Tension headache, and Tubular adenoma of colon.  He is a patient of Hetty Blend NP who I am seeing today for an acute issue of pain in the right big toe x 1 day. He reports that he took his cut part of his toenail off and noticed pus. There is redness and warmth noted   Review of Systems See HPI   Past Medical History:  Diagnosis Date   Alcohol abuse    Allergy    Anxiety    Anxiety and depression    Benzodiazepine dependence (HCC)    Depression    Difficulty urinating    cant urinate in public places (restrooms)   Diverticulitis    Fatty liver 04/11/2023   GERD (gastroesophageal reflux disease)    Opioid dependence (HCC)    Pilonidal cyst    Seasonal allergies    Skull fracture (HCC) 2004   Trauma   Tension headache    Tubular adenoma of colon     Social History   Socioeconomic History   Marital status: Married    Spouse name: Not on file   Number of children: 0   Years of education: 12   Highest education level: Some college, no degree  Occupational History   Occupation: unemployed  Tobacco Use   Smoking status: Every Day    Types: E-cigarettes   Smokeless tobacco: Never   Tobacco comments:    quit 2 or more years  Vaping Use   Vaping Use: Every day   Substances: Nicotine  Substance and Sexual Activity   Alcohol use: No   Drug use: Yes    Types: Marijuana    Comment: last smoke a "few days ago"   Sexual activity: Not Currently    Partners: Female    Birth control/protection: Surgical, None    Comment: vasectomy   Other Topics Concern   Not on file  Social History Narrative   HSG, attending [employer] (09/2010). Married June 2011.No children. NO history of physical or sexual abuse. Not working at this time.             Social Determinants of Health   Financial Resource Strain: Low Risk  (02/22/2023)   Overall Financial Resource Strain (CARDIA)    Difficulty of Paying Living Expenses: Not very hard  Food Insecurity: No Food Insecurity (02/22/2023)   Hunger Vital Sign    Worried About Running Out of Food in the Last Year: Never true    Ran Out of Food in the Last Year: Never true  Transportation Needs: No Transportation Needs (02/22/2023)   PRAPARE - Administrator, Civil Service (Medical): No    Lack of Transportation (Non-Medical): No  Physical Activity: Unknown (02/22/2023)   Exercise Vital Sign    Days of Exercise per Week: 0 days    Minutes of Exercise per Session: Not on file  Stress: Patient Declined (02/22/2023)   [employer] of Occupational Health - Occupational Stress Questionnaire    Feeling of Stress : Patient declined  Social Connections: Unknown (02/22/2023)   Social Connection and Isolation Panel [NHANES]  Frequency of Communication with Friends and Family: Patient declined    Frequency of Social Gatherings with Friends and Family: Patient declined    Attends Religious Services: Never    Database administrator or Organizations: No    Attends Engineer, structural: Not on file    Marital Status: Married  Catering manager Violence: Not on file    Past Surgical History:  Procedure Laterality Date   ORIF right 3rd finger  12/04/2010   Dr. Merlyn Lot - see hospital notes.   pilonidal cystectomy  08/01/2011   RADIOFREQUENCY ABLATION NERVES  12/10/2012   VASECTOMY  04/03/2010   WISDOM TOOTH EXTRACTION      Family History  Problem Relation Age of Onset   Depression Mother    Anxiety disorder Mother    Alcohol abuse Mother    Hypertension Father     Hyperlipidemia Father    Colon polyps Paternal Grandfather    Cancer Paternal Aunt        breast   Breast cancer Paternal Aunt    Colon cancer Neg Hx    Esophageal cancer Neg Hx    Pancreatic cancer Neg Hx    Stomach cancer Neg Hx    Rectal cancer Neg Hx     Allergies  Allergen Reactions   Avelox [Moxifloxacin Hcl In Nacl] Nausea And Vomiting   Fluoxetine     Intolerant of all SSRI's, NE drugs   Nsaids Other (See Comments)   Other Nausea And Vomiting    Strong antibiotics by mouth / antidepressants   Moxifloxacin Nausea And Vomiting    Current Outpatient Medications on File Prior to Visit  Medication Sig Dispense Refill   clonazePAM (KLONOPIN) 1 MG tablet Take 1-2 mg by mouth 3 (three) times daily.     cyclobenzaprine (FLEXERIL) 5 MG tablet Take 1 tablet (5 mg total) by mouth 3 (three) times daily as needed for muscle spasms. 90 tablet 6   Dextromethorphan-quiNIDine (NUEDEXTA) 20-10 MG capsule Take 1 capsule by mouth 2 (two) times daily.     gabapentin (NEURONTIN) 800 MG tablet Take 800 mg by mouth 4 (four) times daily.     hydrOXYzine (VISTARIL) 25 MG capsule Take 75 mg by mouth at bedtime as needed for anxiety.      lamoTRIgine (LAMICTAL) 150 MG tablet Take 150 mg by mouth daily.     nitroGLYCERIN (NITRODUR - DOSED IN MG/24 HR) 0.2 mg/hr patch Apply 1/4 patch daily to tendon for tendonitis. 30 patch 6   omega-3 acid ethyl esters (LOVAZA) 1 g capsule Take 2 capsules (2 g total) by mouth 2 (two) times daily. 120 capsule 2   omeprazole (PRILOSEC) 20 MG capsule Take 1 capsule (20 mg total) by mouth daily. 90 capsule 3   propranolol (INDERAL) 20 MG tablet Take 20 mg by mouth 4 (four) times daily.     No current facility-administered medications on file prior to visit.    BP 120/70   Pulse 74   Temp 98.4 F (36.9 C) (Oral)   Ht 6' (1.829 m)   Wt 202 lb (91.6 kg)   SpO2 97%   BMI 27.40 kg/m       Objective:   Physical Exam Vitals and nursing note reviewed.   Constitutional:      Appearance: Normal appearance.  Feet:     Right foot:     Skin integrity: Erythema and warmth present.     Toenail Condition: Right toenails are ingrown.     Comments:  Redness, warmth and swelling noted to the distal tip of right great toe. No active drainage noted  Skin:    General: Skin is warm and dry.     Findings: Erythema present.  Neurological:     General: No focal deficit present.     Mental Status: He is alert and oriented to person, place, and time.  Psychiatric:        Mood and Affect: Mood normal.        Behavior: Behavior normal.        Thought Content: Thought content normal.        Judgment: Judgment normal.        Assessment & Plan:  1. Ingrown toenail of right foot - Will start on doxycycline for suspected infection and refer to podiatry  - doxycycline (VIBRAMYCIN) 100 MG capsule; Take 1 capsule (100 mg total) by mouth 2 (two) times daily.  Dispense: 14 capsule; Refill: 0 - Ambulatory referral to Podiatry  Shirline Frees, NP

## 2023-05-14 ENCOUNTER — Encounter: Payer: Self-pay | Admitting: Adult Health

## 2023-05-15 NOTE — Telephone Encounter (Signed)
Please advise 

## 2023-05-17 ENCOUNTER — Ambulatory Visit: Payer: No Typology Code available for payment source | Admitting: Podiatry

## 2023-05-17 ENCOUNTER — Telehealth: Payer: Self-pay | Admitting: Podiatry

## 2023-05-17 DIAGNOSIS — L600 Ingrowing nail: Secondary | ICD-10-CM

## 2023-05-17 NOTE — Patient Instructions (Signed)

## 2023-05-17 NOTE — Telephone Encounter (Signed)
Pts wife called stating they were expecting a medication to have been called into the pharmacy from today's procedure and the pharmacy does not have it. The pharmacy is correct in the system.

## 2023-05-17 NOTE — Progress Notes (Signed)
Subjective:  Patient ID: [name], male    DOB: 08/17/1981,  MRN: [id number]  Chief Complaint  Patient presents with   Ingrown Toenail    Rm 19 Bilateral hallux ingornw of medial border x years off and on. Pt is currently taking doxycycline to prevent and infection. Soreness and redness no mild drainage of right hallux per pt.     42 y.o. male presents with concern for ingrown pain in the bilateral hallux.  He says has been going on for years on and off.  Has had some soreness and redness but no drainage.  Past Medical History:  Diagnosis Date   Alcohol abuse    Allergy    Anxiety    Anxiety and depression    Benzodiazepine dependence (HCC)    Depression    Difficulty urinating    cant urinate in public places (restrooms)   Diverticulitis    Fatty liver 04/11/2023   GERD (gastroesophageal reflux disease)    Opioid dependence (HCC)    Pilonidal cyst    Seasonal allergies    Skull fracture (HCC) 2004   Trauma   Tension headache    Tubular adenoma of colon     Allergies  Allergen Reactions   Avelox [Moxifloxacin Hcl In Nacl] Nausea And Vomiting   Fluoxetine     Intolerant of all SSRI's, NE drugs   Nsaids Other (See Comments)   Other Nausea And Vomiting    Strong antibiotics by mouth / antidepressants   Moxifloxacin Nausea And Vomiting    ROS: Negative except as per HPI above  Objective:  General: AAO x3, NAD  Dermatological: Incurvation is present along the bilateral hallux nail border of the bilateral great toe. There is localized edema without any erythema or increase in warmth around the nail border. There is no drainage or pus. There is no ascending cellulitis. No malodor. No open lesions or pre-ulcerative lesions.    Vascular:  Dorsalis Pedis artery and Posterior Tibial artery pedal pulses are 2/4 bilateral.  Capillary fill time < 3 sec to all digits.   Neruologic: Grossly intact via light touch bilateral. Protective threshold intact to all sites  bilateral.   Musculoskeletal: No gross boney pedal deformities bilateral. No pain, crepitus, or limitation noted with foot and ankle range of motion bilateral. Muscular strength 5/5 in all groups tested bilateral.  Gait: Unassisted, Nonantalgic.   No images are attached to the encounter.    Assessment:   1. Ingrown nail of great toe of right foot   2. Ingrown nail of great toe of left foot      Plan:  Patient was evaluated and treated and all questions answered.  Ingrown Nail, bilaterally -Patient elects to proceed with minor surgery to remove ingrown toenail today. Consent reviewed and signed by patient. -Ingrown nail excised. See procedure note. -Educated on post-procedure care including soaking. Written instructions provided and reviewed. -Patient to follow up in 2 weeks for nail check.  Procedure: Excision of Ingrown Toenail Location: Bilateral 1st toe  bilateral  nail borders. Anesthesia: Lidocaine 1% plain; 1.5 mL and Marcaine 0.5% plain; 1.5 mL, digital block. Skin Prep: Betadine. Dressing: Silvadene; telfa; dry, sterile, compression dressing. Technique: Following skin prep, the toe was exsanguinated and a tourniquet was secured at the base of the toe. The affected nail border was freed, split with a nail splitter, and excised. Chemical matrixectomy was then performed with phenol and irrigated out with alcohol. The tourniquet was then removed and sterile dressing applied. Disposition:  Patient tolerated procedure well. Patient to return in 2 weeks for follow-up.    Return in about 2 weeks (around 05/31/2023) for nail check.          Corinna Gab, DPM [facility] / [facility]

## 2023-05-18 ENCOUNTER — Telehealth: Payer: Self-pay | Admitting: Podiatry

## 2023-05-18 NOTE — Telephone Encounter (Signed)
Wife is calling checking the status on Pt Rx ointment for foot    Please advise

## 2023-05-18 NOTE — Telephone Encounter (Signed)
Called pts wife to let her know no rx was to be sent in and the ointment he was talking about was a neosporin or ointment like that. She asked if ok to use neosporin with pain relief and I verified with Dr Allena Katz since Dr Annamary Rummage is in surgery and he said that would be fine to use. She said thank you.

## 2023-05-22 ENCOUNTER — Encounter: Payer: Self-pay | Admitting: Podiatry

## 2023-05-24 ENCOUNTER — Encounter: Payer: Self-pay | Admitting: Family Medicine

## 2023-05-24 ENCOUNTER — Ambulatory Visit: Payer: No Typology Code available for payment source | Admitting: Family Medicine

## 2023-05-24 VITALS — BP 112/78 | HR 85 | Temp 97.7°F | Ht 72.0 in | Wt 205.0 lb

## 2023-05-24 DIAGNOSIS — R7303 Prediabetes: Secondary | ICD-10-CM | POA: Diagnosis not present

## 2023-05-24 DIAGNOSIS — K760 Fatty (change of) liver, not elsewhere classified: Secondary | ICD-10-CM | POA: Diagnosis not present

## 2023-05-24 DIAGNOSIS — D72820 Lymphocytosis (symptomatic): Secondary | ICD-10-CM

## 2023-05-24 DIAGNOSIS — R748 Abnormal levels of other serum enzymes: Secondary | ICD-10-CM | POA: Diagnosis not present

## 2023-05-24 DIAGNOSIS — E781 Pure hyperglyceridemia: Secondary | ICD-10-CM | POA: Diagnosis not present

## 2023-05-24 NOTE — Progress Notes (Signed)
Subjective:     Patient ID: [name], male    DOB: Jun 27, 1981, 42 y.o.   MRN: [id number]  Chief Complaint  Patient presents with   Follow-up    Is not fasting, would like to come back labs    HPI  Discussed the use of AI scribe software for clinical note transcription with the patient, who gave verbal consent to proceed.  History of Present Illness         Here for f/u on chronic conditions and abnormal labs.   States he is not fasting. He is anxious about the visit. Prefers to return for labs another time when he is fasting.   Reports taking Lovaza only once daily in general and misses some days entirely.  States he made healthy diet changes.  Switched from mild 2% to skim milk.  Cut out soda  He stopped alcohol in the past and now smokes marijuana.    There are no preventive care reminders to display for this patient.  Past Medical History:  Diagnosis Date   Alcohol abuse    Allergy    Anxiety    Anxiety and depression    Benzodiazepine dependence (HCC)    Depression    Difficulty urinating    cant urinate in public places (restrooms)   Diverticulitis    Fatty liver 04/11/2023   GERD (gastroesophageal reflux disease)    Opioid dependence (HCC)    Pilonidal cyst    Seasonal allergies    Skull fracture (HCC) 2004   Trauma   Tension headache    Tubular adenoma of colon     Past Surgical History:  Procedure Laterality Date   ORIF right 3rd finger  12/04/2010   Dr. Merlyn Lot - see hospital notes.   pilonidal cystectomy  08/01/2011   RADIOFREQUENCY ABLATION NERVES  12/10/2012   VASECTOMY  04/03/2010   WISDOM TOOTH EXTRACTION      Family History  Problem Relation Age of Onset   Depression Mother    Anxiety disorder Mother    Alcohol abuse Mother    Hypertension Father    Hyperlipidemia Father    Colon polyps Paternal Grandfather    Cancer Paternal Aunt        breast   Breast cancer Paternal Aunt    Colon cancer Neg Hx    Esophageal cancer Neg  Hx    Pancreatic cancer Neg Hx    Stomach cancer Neg Hx    Rectal cancer Neg Hx     Social History   Socioeconomic History   Marital status: Married    Spouse name: Not on file   Number of children: 0   Years of education: 12   Highest education level: Some college, no degree  Occupational History   Occupation: unemployed  Tobacco Use   Smoking status: Every Day    Types: E-cigarettes   Smokeless tobacco: Never   Tobacco comments:    quit 2 or more years  Vaping Use   Vaping Use: Every day   Substances: Nicotine  Substance and Sexual Activity   Alcohol use: No   Drug use: Yes    Types: Marijuana    Comment: last smoke a "few days ago"   Sexual activity: Not Currently    Partners: Female    Birth control/protection: Surgical, None    Comment: vasectomy  Other Topics Concern   Not on file  Social History Narrative   HSG, attending [employer] (09/2010). Married June 2011.No children. NO  history of physical or sexual abuse. Not working at this time.             Social Determinants of Health   Financial Resource Strain: Low Risk  (02/22/2023)   Overall Financial Resource Strain (CARDIA)    Difficulty of Paying Living Expenses: Not very hard  Food Insecurity: No Food Insecurity (02/22/2023)   Hunger Vital Sign    Worried About Running Out of Food in the Last Year: Never true    Ran Out of Food in the Last Year: Never true  Transportation Needs: No Transportation Needs (02/22/2023)   PRAPARE - Administrator, Civil Service (Medical): No    Lack of Transportation (Non-Medical): No  Physical Activity: Unknown (02/22/2023)   Exercise Vital Sign    Days of Exercise per Week: 0 days    Minutes of Exercise per Session: Not on file  Stress: Patient Declined (02/22/2023)   [employer] of Occupational Health - Occupational Stress Questionnaire    Feeling of Stress : Patient declined  Social Connections: Unknown (02/22/2023)   Social Connection and Isolation Panel  [NHANES]    Frequency of Communication with Friends and Family: Patient declined    Frequency of Social Gatherings with Friends and Family: Patient declined    Attends Religious Services: Never    Database administrator or Organizations: No    Attends Engineer, structural: Not on file    Marital Status: Married  Catering manager Violence: Not on file    Outpatient Medications Prior to Visit  Medication Sig Dispense Refill   clonazePAM (KLONOPIN) 1 MG tablet Take 1-2 mg by mouth 3 (three) times daily.     cyclobenzaprine (FLEXERIL) 5 MG tablet Take 1 tablet (5 mg total) by mouth 3 (three) times daily as needed for muscle spasms. 90 tablet 6   Dextromethorphan-quiNIDine (NUEDEXTA) 20-10 MG capsule Take 1 capsule by mouth 2 (two) times daily.     gabapentin (NEURONTIN) 800 MG tablet Take 800 mg by mouth 4 (four) times daily.     hydrOXYzine (VISTARIL) 25 MG capsule Take 75 mg by mouth at bedtime as needed for anxiety.      lamoTRIgine (LAMICTAL) 150 MG tablet Take 150 mg by mouth daily.     nitroGLYCERIN (NITRODUR - DOSED IN MG/24 HR) 0.2 mg/hr patch Apply 1/4 patch daily to tendon for tendonitis. 30 patch 6   omega-3 acid ethyl esters (LOVAZA) 1 g capsule Take 2 capsules (2 g total) by mouth 2 (two) times daily. 120 capsule 2   omeprazole (PRILOSEC) 20 MG capsule Take 1 capsule (20 mg total) by mouth daily. 90 capsule 3   propranolol (INDERAL) 20 MG tablet Take 20 mg by mouth 4 (four) times daily.     doxycycline (VIBRAMYCIN) 100 MG capsule Take 1 capsule (100 mg total) by mouth 2 (two) times daily. (Patient not taking: Reported on 05/24/2023) 14 capsule 0   No facility-administered medications prior to visit.    Allergies  Allergen Reactions   Avelox [Moxifloxacin Hcl In Nacl] Nausea And Vomiting   Fluoxetine     Intolerant of all SSRI's, NE drugs   Nsaids Other (See Comments)   Other Nausea And Vomiting    Strong antibiotics by mouth / antidepressants   Moxifloxacin Nausea  And Vomiting    Review of Systems  Constitutional:  Negative for chills and fever.  Respiratory:  Negative for shortness of breath.   Cardiovascular:  Negative for chest pain and palpitations.  Gastrointestinal:  Negative for abdominal pain, constipation, diarrhea, nausea and vomiting.  Neurological:  Negative for dizziness.       Objective:    Physical Exam Constitutional:      General: He is not in acute distress.    Appearance: He is not ill-appearing.  Eyes:     Extraocular Movements: Extraocular movements intact.     Conjunctiva/sclera: Conjunctivae normal.  Cardiovascular:     Rate and Rhythm: Normal rate.  Pulmonary:     Effort: Pulmonary effort is normal.  Musculoskeletal:     Cervical back: Normal range of motion.  Skin:    General: Skin is warm and dry.  Neurological:     General: No focal deficit present.     Mental Status: He is alert and oriented to person, place, and time.  Psychiatric:        Mood and Affect: Mood normal.        Behavior: Behavior normal.        Thought Content: Thought content normal.      BP 112/78 (BP Location: Left Arm, Patient Position: Sitting, Cuff Size: Large)   Pulse 85   Temp 97.7 F (36.5 C) (Temporal)   Ht 6' (1.829 m)   Wt 205 lb (93 kg)   SpO2 98%   BMI 27.80 kg/m  Wt Readings from Last 3 Encounters:  05/24/23 205 lb (93 kg)  05/11/23 202 lb (91.6 kg)  03/29/23 206 lb (93.4 kg)       Assessment & Plan:   Problem List Items Addressed This Visit       Digestive   Fatty liver     Other   Leukocytosis   Relevant Orders   CBC with Differential/Platelet   Other Visit Diagnoses     Hypertriglyceridemia    -  Primary   Relevant Orders   Lipid panel   Hepatic function panel   Elevated liver enzymes       Relevant Orders   CBC with Differential/Platelet   Basic metabolic panel   Hepatic function panel   Prediabetes       Relevant Orders   CBC with Differential/Platelet   Basic metabolic panel       Reviewed labs and [place] results with patient and his wife.  Counseling on potential long term consequences of uncontrolled HLD, hypertriglyceridemia and fatty liver disease.  He will return for fasting labs.  Consider referral to lipid clinic.   I have discontinued Lenzy A. Slotnick's doxycycline. I am also having him maintain his hydrOXYzine, clonazePAM, Nuedexta, cyclobenzaprine, gabapentin, lamoTRIgine, propranolol, omeprazole, omega-3 acid ethyl esters, and nitroGLYCERIN.  No orders of the defined types were placed in this encounter.

## 2023-05-24 NOTE — Patient Instructions (Signed)
Continue Lovaza for triglycerides.   Follow up fasting and hydrated to get blood work.      Fatty Liver Disease  The liver converts food into energy, removes toxic material from the blood, makes important proteins, and absorbs necessary vitamins from food. Fatty liver disease occurs when too much fat has built up in your liver cells. Fatty liver disease is also called hepatic steatosis. In many cases, fatty liver disease does not cause symptoms or problems. It is often diagnosed when tests are being done for other reasons. However, over time, fatty liver can cause inflammation that may lead to more serious liver problems, such as scarring of the liver (cirrhosis) and liver failure. Fatty liver is associated with insulin resistance, increased body fat, high blood pressure (hypertension), and high cholesterol. These are features of metabolic syndrome and increase your risk for stroke, diabetes, and heart disease. What are the causes? This condition may be caused by components of metabolic syndrome: Obesity. Insulin resistance. High cholesterol. Other causes: Alcohol abuse. Poor nutrition. Cushing syndrome. Pregnancy. Certain drugs. Poisons. Some viral infections. What increases the risk? You are more likely to develop this condition if you: Abuse alcohol. Are overweight. Have diabetes. Have hepatitis. Have a high triglyceride level. Are pregnant. What are the signs or symptoms? Fatty liver disease often does not cause symptoms. If symptoms do develop, they can include: Fatigue and weakness. Weight loss. Confusion. Nausea, vomiting, or abdominal pain. Yellowing of your skin and the white parts of your eyes (jaundice). Itchy skin. How is this diagnosed? This condition may be diagnosed by: A physical exam and your medical history. Blood tests. Imaging tests, such as an ultrasound, CT scan, or MRI. A liver biopsy. A small sample of liver tissue is removed using a needle. The  sample is then looked at under a microscope. How is this treated? Fatty liver disease is often caused by other health conditions. Treatment for fatty liver may involve medicines and lifestyle changes to manage conditions such as: Alcoholism. High cholesterol. Diabetes. Being overweight or obese. Follow these instructions at home:  Do not drink alcohol. If you have trouble quitting, ask your health care provider how to safely quit with the help of medicine or a supervised program. This is important to keep your condition from getting worse. Eat a healthy diet as told by your health care provider. Ask your health care provider about working with a dietitian to develop an eating plan. Exercise regularly. This can help you lose weight and control your cholesterol and diabetes. Talk to your health care provider about an exercise plan and which activities are best for you. Take over-the-counter and prescription medicines only as told by your health care provider. Keep all follow-up visits. This is important. Contact a health care provider if: You have trouble controlling your: Blood sugar. This is especially important if you have diabetes. Cholesterol. Drinking of alcohol. Get help right away if: You have abdominal pain. You have jaundice. You have nausea and are vomiting. You vomit blood or material that looks like coffee grounds. You have stools that are black, tar-like, or bloody. Summary Fatty liver disease develops when too much fat builds up in the cells of your liver. Fatty liver disease often causes no symptoms or problems. However, over time, fatty liver can cause inflammation that may lead to more serious liver problems, such as scarring of the liver (cirrhosis). You are more likely to develop this condition if you abuse alcohol, are pregnant, are overweight, have diabetes, have  hepatitis, or have high triglyceride or cholesterol levels. Contact your health care provider if you have  trouble controlling your blood sugar, cholesterol, or drinking of alcohol. This information is not intended to replace advice given to you by your health care provider. Make sure you discuss any questions you have with your health care provider. Document Revised: 09/02/2020 Document Reviewed: 09/02/2020 Elsevier Patient Education  2024 [employer].

## 2023-05-28 LAB — BASIC METABOLIC PANEL
BUN: 15 mg/dL (ref 6–23)
CO2: 27 mEq/L (ref 19–32)
Calcium: 9.6 mg/dL (ref 8.4–10.5)
Chloride: 106 mEq/L (ref 96–112)
Creatinine, Ser: 0.86 mg/dL (ref 0.40–1.50)
GFR: 108.82 mL/min (ref >60.00)
Glucose, Bld: 121 mg/dL — ABNORMAL HIGH (ref 70–99)
Potassium: 3.9 mEq/L (ref 3.5–5.1)
Sodium: 140 mEq/L (ref 135–145)

## 2023-05-28 LAB — CBC WITH DIFFERENTIAL/PLATELET
Basophils Absolute: 0.1 10*3/uL (ref 0.0–0.1)
Basophils Relative: 0.5 % (ref 0.0–3.0)
Eosinophils Absolute: 0.6 10*3/uL (ref 0.0–0.7)
Eosinophils Relative: 4.6 % (ref 0.0–5.0)
HCT: 44.9 % (ref 39.0–52.0)
Hemoglobin: 14.6 g/dL (ref 13.0–17.0)
Lymphocytes Relative: 44 % (ref 12.0–46.0)
Lymphs Abs: 5.7 10*3/uL — ABNORMAL HIGH (ref 0.7–4.0)
MCHC: 32.5 g/dL (ref 30.0–36.0)
MCV: 90.9 fl (ref 78.0–100.0)
Monocytes Absolute: 1.2 10*3/uL — ABNORMAL HIGH (ref 0.1–1.0)
Monocytes Relative: 9.3 % (ref 3.0–12.0)
Neutro Abs: 5.4 10*3/uL (ref 1.4–7.7)
Neutrophils Relative %: 41.6 % — ABNORMAL LOW (ref 43.0–77.0)
Platelets: 403 10*3/uL — ABNORMAL HIGH (ref 150.0–400.0)
RBC: 4.94 Mil/uL (ref 4.22–5.81)
RDW: 14.1 % (ref 11.5–15.5)
WBC: 13.1 10*3/uL — ABNORMAL HIGH (ref 4.0–10.5)

## 2023-05-28 LAB — HEPATIC FUNCTION PANEL
ALT: 40 U/L (ref 0–53)
AST: 20 U/L (ref 0–37)
Albumin: 4.4 g/dL (ref 3.5–5.2)
Alkaline Phosphatase: 66 U/L (ref 39–117)
Bilirubin, Direct: 0 mg/dL (ref 0.0–0.3)
Total Bilirubin: 0.3 mg/dL (ref 0.2–1.2)
Total Protein: 7.3 g/dL (ref 6.0–8.3)

## 2023-05-28 LAB — LIPID PANEL
Cholesterol: 271 mg/dL — ABNORMAL HIGH (ref 0–200)
HDL: 27.4 mg/dL — ABNORMAL LOW (ref >39.00)
Total CHOL/HDL Ratio: 10
Triglycerides: 480 mg/dL — ABNORMAL HIGH (ref 0.0–149.0)

## 2023-05-28 LAB — LDL CHOLESTEROL, DIRECT: Direct LDL: 163 mg/dL

## 2023-05-30 ENCOUNTER — Other Ambulatory Visit: Payer: Self-pay | Admitting: Family Medicine

## 2023-05-30 ENCOUNTER — Ambulatory Visit: Payer: No Typology Code available for payment source | Admitting: Podiatry

## 2023-05-30 DIAGNOSIS — D72820 Lymphocytosis (symptomatic): Secondary | ICD-10-CM

## 2023-05-30 DIAGNOSIS — L600 Ingrowing nail: Secondary | ICD-10-CM

## 2023-05-30 NOTE — Progress Notes (Signed)
Please make him aware of the following:  Your white blood cell count and lymphocytes are elevated. I am referring you to a blood specialist for further evaluation. They will call you from the [facility] to schedule a visit.  Your triglycerides have improved. Continue your current medications daily. Avoid foods high in fat, fried foods and processed foods.

## 2023-06-05 NOTE — Progress Notes (Signed)
   Chief Complaint  Patient presents with   Ingrown Toenail    Patient came in today for bilateral ingrown follow-up    Subjective: 42 y.o. male presents today status post permanent nail avulsion procedure of the bilateral great toes that was performed on 05/17/2023 by Dr. Levester Fresh.  Doing well.  He soaked his foot and applied antibiotic ointment as instructed.  No new complaints.   Past Medical History:  Diagnosis Date   Alcohol abuse    Allergy    Anxiety    Anxiety and depression    Benzodiazepine dependence (HCC)    Depression    Difficulty urinating    cant urinate in public places (restrooms)   Diverticulitis    Fatty liver 04/11/2023   GERD (gastroesophageal reflux disease)    Opioid dependence (HCC)    Pilonidal cyst    Seasonal allergies    Skull fracture (HCC) 2004   Trauma   Tension headache    Tubular adenoma of colon     Objective: Neurovascular status intact.  Skin is warm, dry and supple. Nail and respective nail fold appears to be healing appropriately.   Assessment: #1 s/p partial permanent nail matrixectomy bilateral great toes   Plan of care: #1 patient was evaluated  #2 light debridement of the periungual debris was performed to the border of the respective toe and nail plate using a tissue nipper. #3 patient is to return to clinic on a PRN basis.   Felecia Shelling, DPM [facility]  Dr. Felecia Shelling, DPM    [address] [place], [place] [address]                Office [phone]  Fax [phone]

## 2023-06-26 ENCOUNTER — Ambulatory Visit
Admission: RE | Admit: 2023-06-26 | Discharge: 2023-06-26 | Disposition: A | Discharge status: Home / Self Care | Payer: No Typology Code available for payment source | Source: Ambulatory Visit | Attending: Family Medicine | Admitting: Family Medicine

## 2023-06-26 VITALS — BP 110/71 | HR 82 | Temp 98.9°F | Resp 17

## 2023-06-26 DIAGNOSIS — H6121 Impacted cerumen, right ear: Secondary | ICD-10-CM | POA: Diagnosis not present

## 2023-06-26 NOTE — ED Triage Notes (Signed)
Pt presents with c/o ear fullness. Pt states he has ear wax built up. Pt states he has not been able to hear.

## 2023-06-26 NOTE — ED Provider Notes (Signed)
[facility]    CSN: [id number] Arrival date & time: 06/26/23  1859      History   Chief Complaint Chief Complaint  Patient presents with   Ear Fullness    Appointment    HPI [name] is a 42 y.o. male.   Patient complains of not being able to hear out of his right ear.  Patient reports he thinks he has wax packed down into his ear  The history is provided by the patient. No language interpreter was used.  Ear Fullness This is a new problem. The problem occurs constantly. The problem has been gradually worsening.    Past Medical History:  Diagnosis Date   Alcohol abuse    Allergy    Anxiety    Anxiety and depression    Benzodiazepine dependence (HCC)    Depression    Difficulty urinating    cant urinate in public places (restrooms)   Diverticulitis    Fatty liver 04/11/2023   GERD (gastroesophageal reflux disease)    Opioid dependence (HCC)    Pilonidal cyst    Seasonal allergies    Skull fracture (HCC) 2004   Trauma   Tension headache    Tubular adenoma of colon     Patient Active Problem List   Diagnosis Date Noted   Fatty liver 04/11/2023   Other hyperlipidemia 09/02/2020   Low testosterone 09/02/2020   Leukocytosis 09/02/2020   Hyperglycemia 09/02/2020   AC joint pain 10/10/2019   Labral tear of shoulder, right, initial encounter 09/05/2019   Bursitis of right shoulder 08/14/2019   Scapular dyskinesis 10/09/2018   Rib pain on left side 09/13/2016   Plica syndrome of left knee 07/25/2016   Spondylolisthesis at L5-S1 level 08/04/2014   Opioid dependence (HCC) 06/23/2014   Benzodiazepine dependence (HCC) 06/22/2014   Depression 06/22/2014   Anxiety 06/22/2014   Substance induced mood disorder (HCC) 06/22/2014   PILONIDAL CYST 01/23/2011   PARESTHESIA 12/22/2010   Acute lumbar back pain 09/30/2010   HEADACHE, TENSION 09/29/2010   ALCOHOL ABUSE, HX OF 09/29/2010    Past Surgical History:  Procedure Laterality Date   ORIF right  3rd finger  12/04/2010   Dr. Merlyn Lot - see hospital notes.   pilonidal cystectomy  08/01/2011   RADIOFREQUENCY ABLATION NERVES  12/10/2012   VASECTOMY  04/03/2010   WISDOM TOOTH EXTRACTION         Home Medications    Prior to Admission medications   Medication Sig Start Date End Date Taking? Authorizing Provider  clonazePAM (KLONOPIN) 1 MG tablet Take 1-2 mg by mouth 3 (three) times daily.    [provider]  cyclobenzaprine (FLEXERIL) 5 MG tablet Take 1 tablet (5 mg total) by mouth 3 (three) times daily as needed for muscle spasms. 02/21/22   Judi Saa, DO  Dextromethorphan-quiNIDine (NUEDEXTA) 20-10 MG capsule Take 1 capsule by mouth 2 (two) times daily.    [provider]  gabapentin (NEURONTIN) 800 MG tablet Take 800 mg by mouth 4 (four) times daily. 08/27/22   [provider]  hydrOXYzine (VISTARIL) 25 MG capsule Take 75 mg by mouth at bedtime as needed for anxiety.     [provider]  lamoTRIgine (LAMICTAL) 150 MG tablet Take 150 mg by mouth daily. 08/11/22   [provider]  nitroGLYCERIN (NITRODUR - DOSED IN MG/24 HR) 0.2 mg/hr patch Apply 1/4 patch daily to tendon for tendonitis. 03/29/23   Rodolph Bong, MD  omega-3 acid ethyl esters (  LOVAZA) 1 g capsule Take 2 capsules (2 g total) by mouth 2 (two) times daily. 02/22/23   Henson, Vickie L, NP-C  omeprazole (PRILOSEC) 20 MG capsule Take 1 capsule (20 mg total) by mouth daily. 11/14/22   Unk Lightning, PA  propranolol (INDERAL) 20 MG tablet Take 20 mg by mouth 4 (four) times daily. 08/27/22   [provider]    Family History Family History  Problem Relation Age of Onset   Depression Mother    Anxiety disorder Mother    Alcohol abuse Mother    Hypertension Father    Hyperlipidemia Father    Colon polyps Paternal Grandfather    Cancer Paternal Aunt        breast   Breast cancer Paternal Aunt    Colon cancer Neg Hx    Esophageal cancer Neg Hx    Pancreatic  cancer Neg Hx    Stomach cancer Neg Hx    Rectal cancer Neg Hx     Social History Social History   Tobacco Use   Smoking status: Every Day    Types: E-cigarettes   Smokeless tobacco: Never   Tobacco comments:    quit 2 or more years  Vaping Use   Vaping status: Every Day   Substances: Nicotine  Substance Use Topics   Alcohol use: No   Drug use: Yes    Types: Marijuana    Comment: last smoke a "few days ago"     Allergies   Avelox [moxifloxacin hcl in nacl], Fluoxetine, Nsaids, Other, and Moxifloxacin   Review of Systems Review of Systems  All other systems reviewed and are negative.    Physical Exam Triage Vital Signs ED Triage Vitals  Encounter Vitals Group     BP 06/26/23 1915 110/71     Systolic BP Percentile --      Diastolic BP Percentile --      Pulse Rate 06/26/23 1915 82     Resp 06/26/23 1915 17     Temp 06/26/23 1915 98.9 F (37.2 C)     Temp Source 06/26/23 1915 Oral     SpO2 06/26/23 1915 96 %     Weight --      Height --      Head Circumference --      Peak Flow --      Pain Score 06/26/23 1914 0     Pain Loc --      Pain Education --      Exclude from Growth Chart --    No data found.  Updated Vital Signs BP 110/71 (BP Location: Right Arm)   Pulse 82   Temp 98.9 F (37.2 C) (Oral)   Resp 17   SpO2 96%   Visual Acuity Right Eye Distance:   Left Eye Distance:   Bilateral Distance:    Right Eye Near:   Left Eye Near:    Bilateral Near:     Physical Exam Vitals reviewed.  HENT:     Right Ear: There is impacted cerumen.     Mouth/Throat:     Mouth: Mucous membranes are moist.  Cardiovascular:     Rate and Rhythm: Normal rate.  Pulmonary:     Effort: Pulmonary effort is normal.  Neurological:     Mental Status: He is alert.      UC Treatments / Results  Labs (all labs ordered are listed, but only abnormal results are displayed) Labs Reviewed - No data to display  EKG  Radiology No results  found.  Procedures Procedures (including critical care time)  Medications Ordered in UC Medications - No data to display  Initial Impression / Assessment and Plan / UC Course  I have reviewed the triage vital signs and the nursing notes.  Pertinent labs & imaging results that were available during my care of the patient were reviewed by me and considered in my medical decision making (see chart for details).     Right ear canal irrigated until clear Final Clinical Impressions(s) / UC Diagnoses   Final diagnoses:  Hearing loss due to cerumen impaction, right   Discharge Instructions   None    ED Prescriptions   None    PDMP not reviewed this encounter. An After Visit Summary was printed and given to the patient.       Elson Areas, [place] 06/26/23 2011

## 2023-06-28 ENCOUNTER — Ambulatory Visit: Payer: No Typology Code available for payment source | Admitting: Family Medicine

## 2023-06-28 VITALS — BP 112/74 | HR 70 | Ht 72.0 in | Wt 197.0 lb

## 2023-06-28 DIAGNOSIS — M7662 Achilles tendinitis, left leg: Secondary | ICD-10-CM

## 2023-06-28 NOTE — Patient Instructions (Signed)
Thank you for coming in today.    

## 2023-06-28 NOTE — Progress Notes (Signed)
   Johnathan Payor, PhD, LAT, ATC acting as a scribe for Johnathan Graham, MD.  DMARI Rose is a 42 y.o. male who presents to [facility] today for 97-month f/u L heel pain thought to be due to Achilles tendonopathy. Pt was last seen by Dr. Denyse Amass on 03/29/23 and was advised to cont HEP and nitro patches.  Today, pt reports L heel is feeling OK. He notes pain when walking up an incline. He is apprehensive about doing certain activities due to injury.   Pertinent review of systems: No fevers or chills  Relevant historical information: Anxiety and depression   Exam:  BP 112/74   Pulse 70   Ht 6' (1.829 m)   Wt 197 lb (89.4 kg)   SpO2 98%   BMI 26.72 kg/m  General: Well Developed, well nourished, and in no acute distress.   MSK: Left calcaneus normal. Mildly tender palpation.  Normal foot and ankle motion.    Lab and Radiology Results                 Extracorporeal Shockwave Therapy Note    Patient is being treated today with ECSWT. Informed consent was obtained and patient tolerated procedure well.   Therapy performed by [name]  Condition treated: Achilles tendinitis left Treatment preset used: Heel spur Energy used: 120 mJ Frequency used: 10 Hz Number of pulses: 2000 Head Size: Medium Treatment #1 of #4      Assessment and Plan: 42 y.o. male with left Achilles tendinitis.  Improved with conservative management home exercise program and nitroglycerin patch protocol.  He still having some symptoms and is not fully resolved.  We talked about options.  We tried 1 shockwave session today (no cost).  He will see how this feels and if he gets some benefit from it he will schedule for more dedicated shockwave sessions.  Anticipate a course of about 4 sessions.  Continue home exercise program and nitroglycerin patch protocol.      Discussed warning signs or symptoms. Please see discharge instructions. Patient expresses  understanding.   The above documentation has been reviewed and is accurate and complete [name], M.D.  No charge for shockwave today.

## 2023-06-28 NOTE — Progress Notes (Signed)
[facility] [phone]   [phone]  INITIAL CONSULT NOTE  Patient Care Team: Avanell Shackleton, NP-C as PCP - General (Family Medicine) Joneen Caraway (Physician Assistant)  Hematological/Oncological History 02/08/2023: WBC 12.0 (H), Hgb 15.0, Plt 354, ALC 5.1 (H) 02/22/2023: WBC 10.5, Hgb 14.8, Plt 368, ALC 4.1 (H) 05/28/2023: WBC 13.1 (H), Hgb 14.6, Plt 403 (H), ALC 5.7 (H) 06/29/2023: Establish care with [facility] Hematology  CHIEF COMPLAINTS/PURPOSE OF CONSULTATION:  Lymphocytosis  HISTORY OF PRESENTING ILLNESS:  [name] 42 y.o. male with medical history significant for anxiety, depression, GERD, hyperlipidemia, and tension headaches presents to the hematology clinic for evaluation for lymphocytosis. He is accompanied by his wife for this visit.   On exam today, Mr. Pynn reports that his energy levels are unchanged and he is able to complete his ADLs on his own. He reports a fair appetite and generally eats one big meal per day. He recently quit drinking soda and has lost 5-10 lbs as a result. He denies nausea, vomiting or bowel habit changes. He denies easy bruising or signs of active bleeding. He denies fevers, chills, sweats, shortness of breath, chest pain or cough. He has no other complaints. Rest of the ROS is below.   MEDICAL HISTORY:  Past Medical History:  Diagnosis Date   Alcohol abuse    Allergy    Anxiety    Anxiety and depression    Benzodiazepine dependence (HCC)    Depression    Difficulty urinating    cant urinate in public places (restrooms)   Diverticulitis    Fatty liver 04/11/2023   GERD (gastroesophageal reflux disease)    Opioid dependence (HCC)    Pilonidal cyst    Seasonal allergies    Skull fracture (HCC) 2004   Trauma   Tension headache    Tubular adenoma of colon     SURGICAL HISTORY: Past Surgical History:  Procedure Laterality Date   ORIF right 3rd finger  12/04/2010   Dr. Merlyn Lot - see hospital  notes.   pilonidal cystectomy  08/01/2011   RADIOFREQUENCY ABLATION NERVES  12/10/2012   VASECTOMY  04/03/2010   WISDOM TOOTH EXTRACTION      SOCIAL HISTORY: Social History   Socioeconomic History   Marital status: Married    Spouse name: Not on file   Number of children: 0   Years of education: 12   Highest education level: Some college, no degree  Occupational History   Occupation: unemployed  Tobacco Use   Smoking status: Every Day    Types: E-cigarettes   Smokeless tobacco: Never   Tobacco comments:    quit 2 or more years  Vaping Use   Vaping status: Every Day   Substances: Nicotine  Substance and Sexual Activity   Alcohol use: No   Drug use: Yes    Types: Marijuana    Comment: last smoke a "few days ago"   Sexual activity: Not Currently    Partners: Female    Birth control/protection: Surgical, None    Comment: vasectomy  Other Topics Concern   Not on file  Social History Narrative   HSG, attending [employer] (09/2010). Married June 2011.No children. NO history of physical or sexual abuse. Not working at this time.             Social Determinants of Health   Financial Resource Strain: Low Risk  (02/22/2023)   Overall Financial Resource Strain (CARDIA)    Difficulty of Paying Living Expenses: Not  very hard  Food Insecurity: No Food Insecurity (02/22/2023)   Hunger Vital Sign    Worried About Running Out of Food in the Last Year: Never true    Ran Out of Food in the Last Year: Never true  Transportation Needs: No Transportation Needs (02/22/2023)   PRAPARE - Administrator, Civil Service (Medical): No    Lack of Transportation (Non-Medical): No  Physical Activity: Unknown (02/22/2023)   Exercise Vital Sign    Days of Exercise per Week: 0 days    Minutes of Exercise per Session: Not on file  Stress: Patient Declined (02/22/2023)   [employer] of Occupational Health - Occupational Stress Questionnaire    Feeling of Stress : Patient declined   Social Connections: Unknown (02/22/2023)   Social Connection and Isolation Panel [NHANES]    Frequency of Communication with Friends and Family: Patient declined    Frequency of Social Gatherings with Friends and Family: Patient declined    Attends Religious Services: Never    Database administrator or Organizations: No    Attends Engineer, structural: Not on file    Marital Status: Married  Catering manager Violence: Not on file    FAMILY HISTORY: Family History  Problem Relation Age of Onset   Depression Mother    Anxiety disorder Mother    Alcohol abuse Mother    Hypertension Father    Hyperlipidemia Father    Colon polyps Paternal Grandfather    Cancer Paternal Aunt        breast   Breast cancer Paternal Aunt    Colon cancer Neg Hx    Esophageal cancer Neg Hx    Pancreatic cancer Neg Hx    Stomach cancer Neg Hx    Rectal cancer Neg Hx     ALLERGIES:  is allergic to avelox [moxifloxacin hcl in nacl], fluoxetine, nsaids, other, and moxifloxacin.  MEDICATIONS:  Current Outpatient Medications  Medication Sig Dispense Refill   clonazePAM (KLONOPIN) 1 MG tablet Take 1 mg by mouth QID.     cyclobenzaprine (FLEXERIL) 5 MG tablet Take 1 tablet (5 mg total) by mouth 3 (three) times daily as needed for muscle spasms. 90 tablet 6   Dextromethorphan-quiNIDine (NUEDEXTA) 20-10 MG capsule Take 1 capsule by mouth 2 (two) times daily.     gabapentin (NEURONTIN) 800 MG tablet Take 800 mg by mouth 4 (four) times daily.     hydrOXYzine (VISTARIL) 25 MG capsule Take 75 mg by mouth at bedtime as needed for anxiety.      lamoTRIgine (LAMICTAL) 150 MG tablet Take 150 mg by mouth daily.     MAGNESIUM GLUCONATE PO Take by mouth daily.     melatonin 3 MG TABS tablet Take 3 mg by mouth at bedtime.     nitroGLYCERIN (NITRODUR - DOSED IN MG/24 HR) 0.2 mg/hr patch Apply 1/4 patch daily to tendon for tendonitis. 30 patch 6   omega-3 acid ethyl esters (LOVAZA) 1 g capsule Take 2 capsules (2  g total) by mouth 2 (two) times daily. 120 capsule 2   omeprazole (PRILOSEC) 20 MG capsule Take 1 capsule (20 mg total) by mouth daily. 90 capsule 3   propranolol (INDERAL) 20 MG tablet Take 20 mg by mouth 4 (four) times daily.     No current facility-administered medications for this visit.    REVIEW OF SYSTEMS:   Constitutional: ( - ) fevers, ( - )  chills , ( - ) night sweats Eyes: ( - )  blurriness of vision, ( - ) double vision, ( - ) watery eyes Ears, nose, mouth, throat, and face: ( - ) mucositis, ( - ) sore throat Respiratory: ( - ) cough, ( - ) dyspnea, ( - ) wheezes Cardiovascular: ( - ) palpitation, ( - ) chest discomfort, ( - ) lower extremity swelling Gastrointestinal:  ( - ) nausea, ( - ) heartburn, ( - ) change in bowel habits Skin: ( - ) abnormal skin rashes Lymphatics: ( - ) new lymphadenopathy, ( - ) easy bruising Neurological: ( - ) numbness, ( - ) tingling, ( - ) new weaknesses Behavioral/Psych: ( - ) mood change, ( - ) new changes  All other systems were reviewed with the patient and are negative.  PHYSICAL EXAMINATION: ECOG PERFORMANCE STATUS: 0 - Asymptomatic  Vitals:   06/29/23 0936  BP: 129/81  Pulse: 68  Resp: 18  Temp: 99.8 F (37.7 C)  SpO2: 99%   Filed Weights   06/29/23 0936  Weight: 199 lb 6.4 oz (90.4 kg)    GENERAL: well appearing male in NAD  SKIN: skin color, texture, turgor are normal, no rashes or significant lesions EYES: conjunctiva are pink and non-injected, sclera clear OROPHARYNX: no exudate, no erythema; lips, buccal mucosa, and tongue normal  NECK: supple, non-tender LYMPH:  no palpable lymphadenopathy in the cervical or supraclavicular lymph nodes.  LUNGS: clear to auscultation and percussion with normal breathing effort HEART: regular rate & rhythm and no murmurs and no lower extremity edema Musculoskeletal: no cyanosis of digits and no clubbing  PSYCH: alert & oriented x 3, fluent speech NEURO: no focal motor/sensory  deficits  LABORATORY DATA:  I have reviewed the data as listed    Latest Ref Rng & Units 05/28/2023    9:52 AM 02/22/2023    8:59 AM 02/08/2023    8:57 AM  CBC  WBC 4.0 - 10.5 K/uL 13.1  10.5  12.0   Hemoglobin 13.0 - 17.0 g/dL 16.1  09.6  04.5   Hematocrit 39.0 - 52.0 % 44.9  43.4  44.4   Platelets 150.0 - 400.0 K/uL 403.0  368.0  354.0        Latest Ref Rng & Units 05/28/2023    9:52 AM 02/22/2023    8:59 AM 02/08/2023    8:57 AM  CMP  Glucose 70 - 99 mg/dL 409  811  914   BUN 6 - 23 mg/dL 15  12  13    Creatinine 0.40 - 1.50 mg/dL 7.82  9.56  2.13   Sodium 135 - 145 mEq/L 140  136  139   Potassium 3.5 - 5.1 mEq/L 3.9  4.1  4.1   Chloride 96 - 112 mEq/L 106  100  102   CO2 19 - 32 mEq/L 27  28  27    Calcium 8.4 - 10.5 mg/dL 9.6  9.3  9.7   Total Protein 6.0 - 8.3 g/dL 7.3  7.3  7.0   Total Bilirubin 0.2 - 1.2 mg/dL 0.3  0.4  0.3   Alkaline Phos 39 - 117 U/L 66  81  76   AST 0 - 37 U/L 20  43  40   ALT 0 - 53 U/L 40  92  82       ASSESSMENT & PLAN [name] is a 42 y.o. male who presents to the hematology clinic for evaluation of lymphocytosis.   Lymphocytosis is an elevation in the lymphocyte cells in the blood. This may  indicate several possible conditions including hematological malignancies, transient elevation from infection, or response to inflammation. Lymphocytes may also be elevated after splenectomy. The workup for lymphocytosis consists of determining if the lymphocytes are malignant in nature. The best test to determine this is flow cytometry, which can help determine if the lymphocytes are a monoclonal population.  Additional workup includes inflammatory workup and detailed history to assure no infectious symptoms.   #Lymphocytosis --labs to include CBC, CMP, LDH, and flow cytometry --additional studies to include ESR and CRP  --no evidence of lymphadenopathy on physical exam. Patient has no history of splenectomy.  --patient is a current smoker which can  contribute to his leukocytosis.  --RTC based on above workup.   Orders Placed This Encounter  Procedures   CBC with Differential (Cancer Center Only)    Standing Status:   Future    Standing Expiration Date:   06/27/2024   CMP (Cancer Center only)    Standing Status:   Future    Standing Expiration Date:   06/27/2024   Lactate dehydrogenase (LDH)    Standing Status:   Future    Standing Expiration Date:   06/27/2024   Sedimentation rate    Standing Status:   Future    Standing Expiration Date:   06/27/2024   C-reactive protein    Standing Status:   Future    Standing Expiration Date:   06/27/2024   Flow Cytometry, Peripheral Blood (Oncology)    Standing Status:   Future    Standing Expiration Date:   06/27/2024    All questions were answered. The patient knows to call the clinic with any problems, questions or concerns.  I have spent a total of 60 minutes minutes of face-to-face and non-face-to-face time, preparing to see the patient, obtaining and/or reviewing separately obtained history, performing a medically appropriate examination, counseling and educating the patient, ordering tests/procedures, documenting clinical information in the electronic health record, independently interpreting results and communicating results to the patient, and care coordination.   Georga Kaufmann, PA-C Department of Hematology/Oncology [facility] at [facility] Phone: [phone]   Patient was seen with Dr. Leonides Schanz.   I have read the above note and personally examined the patient. I agree with the assessment and plan as noted above.  Briefly [name] is a 42 year old male who presents for evaluation of lymphocytosis.  On most recent labs on 05/28/2023 he was noted to have white blood cell count 13.1, platelets of 403.0, with absolute lymphocytosis of 5700.  Due to concern for these findings he was referred to hematology service for further evaluation and management.  At  this time there are numerous possible etiologies for his lymphocytosis.  CLL would be a considerations or monoclonal B-cell lymphocytosis, however he is relatively young for that diagnosis.  Inflammatory conditions or smoking have been known to cause leukocytosis and thrombocytosis.  Patient is an active smoker.  Would recommend that he discontinue smoking at this time.  We will order flow cytometry, ESR, CRP, and repeat CBC today.  Patient voiced understanding of our findings and the plan moving forward.   Ulysees Barns, MD Department of Hematology/Oncology [facility] at [facility] Phone: [phone] Pager: [phone] Email: [email or web address]

## 2023-06-29 ENCOUNTER — Other Ambulatory Visit: Payer: Self-pay

## 2023-06-29 ENCOUNTER — Inpatient Hospital Stay: Payer: No Typology Code available for payment source

## 2023-06-29 ENCOUNTER — Encounter: Payer: Self-pay | Admitting: Physician Assistant

## 2023-06-29 ENCOUNTER — Inpatient Hospital Stay: Payer: No Typology Code available for payment source | Attending: Physician Assistant | Admitting: Physician Assistant

## 2023-06-29 VITALS — BP 129/81 | HR 68 | Temp 99.8°F | Resp 18 | Ht 72.0 in | Wt 199.4 lb

## 2023-06-29 DIAGNOSIS — F17210 Nicotine dependence, cigarettes, uncomplicated: Secondary | ICD-10-CM

## 2023-06-29 DIAGNOSIS — D72820 Lymphocytosis (symptomatic): Secondary | ICD-10-CM

## 2023-06-29 LAB — CMP (CANCER CENTER ONLY)
ALT: 29 U/L (ref 0–44)
AST: 17 U/L (ref 15–41)
Albumin: 4.5 g/dL (ref 3.5–5.0)
Alkaline Phosphatase: 64 U/L (ref 38–126)
Anion gap: 6 (ref 5–15)
BUN: 13 mg/dL (ref 6–20)
CO2: 27 mmol/L (ref 22–32)
Calcium: 9.5 mg/dL (ref 8.9–10.3)
Chloride: 105 mmol/L (ref 98–111)
Creatinine: 0.91 mg/dL (ref 0.61–1.24)
GFR, Estimated: >60 mL/min (ref >60)
Glucose, Bld: 104 mg/dL — ABNORMAL HIGH (ref 70–99)
Potassium: 4.6 mmol/L (ref 3.5–5.1)
Sodium: 138 mmol/L (ref 135–145)
Total Bilirubin: 0.3 mg/dL (ref 0.3–1.2)
Total Protein: 6.9 g/dL (ref 6.5–8.1)

## 2023-06-29 LAB — LACTATE DEHYDROGENASE: LDH: 122 U/L (ref 98–192)

## 2023-06-29 LAB — C-REACTIVE PROTEIN: CRP: 0.8 mg/dL (ref <1.0)

## 2023-06-29 LAB — CBC WITH DIFFERENTIAL (CANCER CENTER ONLY)
Abs Immature Granulocytes: 0.05 10*3/uL (ref 0.00–0.07)
Basophils Absolute: 0.1 10*3/uL (ref 0.0–0.1)
Basophils Relative: 1 %
Eosinophils Absolute: 0.6 10*3/uL — ABNORMAL HIGH (ref 0.0–0.5)
Eosinophils Relative: 5 %
HCT: 42.1 % (ref 39.0–52.0)
Hemoglobin: 14.6 g/dL (ref 13.0–17.0)
Immature Granulocytes: 0 %
Lymphocytes Relative: 49 %
Lymphs Abs: 6 10*3/uL — ABNORMAL HIGH (ref 0.7–4.0)
MCH: 30.6 pg (ref 26.0–34.0)
MCHC: 34.7 g/dL (ref 30.0–36.0)
MCV: 88.3 fL (ref 80.0–100.0)
Monocytes Absolute: 0.9 10*3/uL (ref 0.1–1.0)
Monocytes Relative: 8 %
Neutro Abs: 4.6 10*3/uL (ref 1.7–7.7)
Neutrophils Relative %: 37 %
Platelet Count: 321 10*3/uL (ref 150–400)
RBC: 4.77 MIL/uL (ref 4.22–5.81)
RDW: 13.5 % (ref 11.5–15.5)
WBC Count: 12.2 10*3/uL — ABNORMAL HIGH (ref 4.0–10.5)
nRBC: 0 % (ref 0.0–0.2)

## 2023-06-29 LAB — SEDIMENTATION RATE: Sed Rate: 4 mm/hr (ref 0–16)

## 2023-07-02 LAB — SURGICAL PATHOLOGY

## 2023-07-05 ENCOUNTER — Telehealth: Payer: Self-pay | Admitting: Physician Assistant

## 2023-07-05 NOTE — Telephone Encounter (Signed)
I spoke to patient's wife, [name], to review the lab results from 06/29/2023. CBC showed mild but persistent lymphocytosis. The inflammatory markers were negative and flow cytometry did not reveal any monoclonal B-cell population. Likely underlying cause is smoking, encouraged smoking cessation. No further hematologic workup is recommended at this time. Patient's PCP can monitor his blood counts every 3-6 months. Happy to see patient back if WBC continues to rise.   [name] expressed understanding of the plan provided.

## 2023-08-27 ENCOUNTER — Other Ambulatory Visit (HOSPITAL_BASED_OUTPATIENT_CLINIC_OR_DEPARTMENT_OTHER): Payer: Self-pay

## 2023-08-27 MED ORDER — COVID-19 MRNA VAC-TRIS(PFIZER) 30 MCG/0.3ML IM SUSY
0.3000 mL | PREFILLED_SYRINGE | Freq: Once | INTRAMUSCULAR | 0 refills | Status: AC
  Filled 2023-08-27: qty 0.3, 1d supply, fill #0

## 2023-08-27 MED ORDER — INFLUENZA VIRUS VACC SPLIT PF (FLUZONE) 0.5 ML IM SUSY
0.5000 mL | PREFILLED_SYRINGE | Freq: Once | INTRAMUSCULAR | 0 refills | Status: AC
  Filled 2023-08-27: qty 0.5, 1d supply, fill #0

## 2023-11-08 ENCOUNTER — Other Ambulatory Visit: Payer: Self-pay | Admitting: Physician Assistant

## 2024-01-22 ENCOUNTER — Ambulatory Visit: Payer: No Typology Code available for payment source | Admitting: Family Medicine

## 2024-01-22 ENCOUNTER — Encounter: Payer: Self-pay | Admitting: Family Medicine

## 2024-01-22 VITALS — BP 128/74 | HR 110 | Temp 97.6°F | Ht 72.0 in | Wt 197.0 lb

## 2024-01-22 DIAGNOSIS — N509 Disorder of male genital organs, unspecified: Secondary | ICD-10-CM

## 2024-01-22 DIAGNOSIS — E781 Pure hyperglyceridemia: Secondary | ICD-10-CM | POA: Diagnosis not present

## 2024-01-22 DIAGNOSIS — D72820 Lymphocytosis (symptomatic): Secondary | ICD-10-CM

## 2024-01-22 DIAGNOSIS — R7303 Prediabetes: Secondary | ICD-10-CM | POA: Diagnosis not present

## 2024-01-22 DIAGNOSIS — F419 Anxiety disorder, unspecified: Secondary | ICD-10-CM

## 2024-01-22 DIAGNOSIS — L819 Disorder of pigmentation, unspecified: Secondary | ICD-10-CM | POA: Diagnosis not present

## 2024-01-22 DIAGNOSIS — K760 Fatty (change of) liver, not elsewhere classified: Secondary | ICD-10-CM

## 2024-01-22 NOTE — Patient Instructions (Addendum)
 Please return fasting tomorrow or one morning soon to the lab on the first floor (nothing to eat or drink except water for at least 8 hours).

## 2024-01-22 NOTE — Telephone Encounter (Signed)
 fyi

## 2024-01-22 NOTE — Progress Notes (Unsigned)
 Subjective:     Patient ID: [name], male    DOB: 03-May-1981, 43 y.o.   MRN: [id number]  Chief Complaint  Patient presents with   Mass    Found a "strange looking thing I want looked at" located next to groin on right side. Denies any pain, itching or oozing. May have scratched a week ago accidently    HPI  History of Present Illness         He is here with concerns regarding a skin lesion on his scrotum. He noticed it 6 months ago and states it is getting larger and becoming bothersome.    Hx of hypertriglyceridemia and is taking Lovaza once daily. States he only eats once daily  Last lipid panel in June 2024 and trigs 480, improved from 901  Hx of prediabetes- last A1c 6.0  He is under the care of Dr. Evelene Croon and Kelle Darting (therapist)  for mental health   Vaping     Health Maintenance Due  Topic Date Due   Pneumococcal Vaccine 17-81 Years old (1 of 2 - PCV) Never done    Past Medical History:  Diagnosis Date   Alcohol abuse    Allergy    Anxiety    Anxiety and depression    Benzodiazepine dependence (HCC)    Depression    Difficulty urinating    cant urinate in public places (restrooms)   Diverticulitis    Fatty liver 04/11/2023   GERD (gastroesophageal reflux disease)    Opioid dependence (HCC)    Pilonidal cyst    Seasonal allergies    Skull fracture (HCC) 2004   Trauma   Tension headache    Tubular adenoma of colon     Past Surgical History:  Procedure Laterality Date   ORIF right 3rd finger  12/04/2010   Dr. Merlyn Lot - see hospital notes.   pilonidal cystectomy  08/01/2011   RADIOFREQUENCY ABLATION NERVES  12/10/2012   VASECTOMY  04/03/2010   WISDOM TOOTH EXTRACTION      Family History  Problem Relation Age of Onset   Depression Mother    Anxiety disorder Mother    Alcohol abuse Mother    Hypertension Father    Hyperlipidemia Father    Colon polyps Paternal Grandfather    Cancer Paternal Aunt        breast   Breast cancer  Paternal Aunt    Colon cancer Neg Hx    Esophageal cancer Neg Hx    Pancreatic cancer Neg Hx    Stomach cancer Neg Hx    Rectal cancer Neg Hx     Social History   Socioeconomic History   Marital status: Married    Spouse name: Not on file   Number of children: 0   Years of education: 12   Highest education level: Some college, no degree  Occupational History   Occupation: unemployed  Tobacco Use   Smoking status: Every Day    Types: E-cigarettes   Smokeless tobacco: Never   Tobacco comments:    quit 2 or more years  Vaping Use   Vaping status: Every Day   Substances: Nicotine  Substance and Sexual Activity   Alcohol use: No   Drug use: Yes    Types: Marijuana    Comment: last smoke a "few days ago"   Sexual activity: Not Currently    Partners: Female    Birth control/protection: Surgical, None    Comment: vasectomy  Other Topics Concern  Not on file  Social History Narrative   HSG, attending [employer] (09/2010). Married June 2011.No children. NO history of physical or sexual abuse. Not working at this time.             Social Drivers of Corporate investment banker Strain: Low Risk  (01/19/2024)   Overall Financial Resource Strain (CARDIA)    Difficulty of Paying Living Expenses: Not very hard  Food Insecurity: No Food Insecurity (01/19/2024)   Hunger Vital Sign    Worried About Running Out of Food in the Last Year: Never true    Ran Out of Food in the Last Year: Never true  Transportation Needs: No Transportation Needs (01/19/2024)   PRAPARE - Administrator, Civil Service (Medical): No    Lack of Transportation (Non-Medical): No  Physical Activity: Unknown (01/19/2024)   Exercise Vital Sign    Days of Exercise per Week: 0 days    Minutes of Exercise per Session: Not on file  Stress: Stress Concern Present (01/19/2024)   [employer] of Occupational Health - Occupational Stress Questionnaire    Feeling of Stress : Very much  Social Connections:  Unknown (01/19/2024)   Social Connection and Isolation Panel [NHANES]    Frequency of Communication with Friends and Family: Patient declined    Frequency of Social Gatherings with Friends and Family: Patient declined    Attends Religious Services: Never    Database administrator or Organizations: No    Attends Engineer, structural: Not on file    Marital Status: Married  Catering manager Violence: Not on file    Outpatient Medications Prior to Visit  Medication Sig Dispense Refill   clonazePAM (KLONOPIN) 1 MG tablet Take 1 mg by mouth QID.     cyclobenzaprine (FLEXERIL) 5 MG tablet Take 1 tablet (5 mg total) by mouth 3 (three) times daily as needed for muscle spasms. 90 tablet 6   Dextromethorphan-quiNIDine (NUEDEXTA) 20-10 MG capsule Take 1 capsule by mouth 2 (two) times daily.     gabapentin (NEURONTIN) 800 MG tablet Take 800 mg by mouth 4 (four) times daily.     hydrOXYzine (VISTARIL) 25 MG capsule Take 75 mg by mouth at bedtime as needed for anxiety.      lamoTRIgine (LAMICTAL) 150 MG tablet Take 150 mg by mouth daily.     MAGNESIUM GLUCONATE PO Take by mouth daily.     melatonin 3 MG TABS tablet Take 3 mg by mouth at bedtime.     nitroGLYCERIN (NITRODUR - DOSED IN MG/24 HR) 0.2 mg/hr patch Apply 1/4 patch daily to tendon for tendonitis. 30 patch 6   omega-3 acid ethyl esters (LOVAZA) 1 g capsule Take 2 capsules (2 g total) by mouth 2 (two) times daily. 120 capsule 2   omeprazole (PRILOSEC) 20 MG capsule Take 1 capsule (20 mg total) by mouth daily. 90 capsule 3   propranolol (INDERAL) 20 MG tablet Take 20 mg by mouth 4 (four) times daily.     No facility-administered medications prior to visit.    Allergies  Allergen Reactions   Avelox [Moxifloxacin Hcl In Nacl] Nausea And Vomiting   Fluoxetine     Intolerant of all SSRI's, NE drugs   Nsaids Other (See Comments)   Other Nausea And Vomiting    Strong antibiotics by mouth / antidepressants   Moxifloxacin Nausea And  Vomiting    Review of Systems  Constitutional:  Negative for chills, fever and weight loss.  Respiratory:  Negative for shortness of breath.   Cardiovascular:  Negative for chest pain, palpitations and leg swelling.  Gastrointestinal:  Negative for abdominal pain, constipation, diarrhea, nausea and vomiting.  Genitourinary:  Negative for dysuria, frequency and urgency.  Musculoskeletal:  Positive for back pain.  Skin:        Changing skin lesion on scrotum   Neurological:  Negative for dizziness and focal weakness.  Psychiatric/Behavioral:  The patient is nervous/anxious.        Objective:    Physical Exam Constitutional:      General: He is not in acute distress.    Appearance: He is not ill-appearing.  Eyes:     Extraocular Movements: Extraocular movements intact.     Conjunctiva/sclera: Conjunctivae normal.  Cardiovascular:     Rate and Rhythm: Normal rate.  Pulmonary:     Effort: Pulmonary effort is normal.  Musculoskeletal:     Cervical back: Normal range of motion and neck supple.  Skin:    General: Skin is warm and dry.  Neurological:     General: No focal deficit present.     Mental Status: He is alert and oriented to person, place, and time.  Psychiatric:        Mood and Affect: Mood normal.        Behavior: Behavior normal.        Thought Content: Thought content normal.      BP 128/74 (BP Location: Left Arm, Patient Position: Sitting)   Pulse (!) 110   Temp 97.6 F (36.4 C) (Temporal)   Ht 6' (1.829 m)   Wt 197 lb (89.4 kg)   SpO2 98%   BMI 26.72 kg/m  Wt Readings from Last 3 Encounters:  01/22/24 197 lb (89.4 kg)  06/29/23 199 lb 6.4 oz (90.4 kg)  06/28/23 197 lb (89.4 kg)       Assessment & Plan:   Problem List Items Addressed This Visit     Anxiety   Changing pigmented skin lesion - Primary   Relevant Orders   Ambulatory referral to Dermatology   Fatty liver   Relevant Orders   Lipid panel   Basic metabolic panel   Hepatic  function panel   Leukocytosis   Relevant Orders   CBC with Differential/Platelet   Basic metabolic panel   Other Visit Diagnoses       Hypertriglyceridemia       Relevant Orders   Lipid panel     Skin lesion of scrotum       Relevant Orders   Ambulatory referral to Dermatology     Prediabetes       Relevant Orders   Hemoglobin A1c   Basic metabolic panel       I am having [name] maintain his hydrOXYzine, clonazePAM, Nuedexta, cyclobenzaprine, gabapentin, lamoTRIgine, propranolol, omega-3 acid ethyl esters, nitroGLYCERIN, melatonin, MAGNESIUM GLUCONATE PO, and omeprazole.  No orders of the defined types were placed in this encounter.

## 2024-01-28 ENCOUNTER — Other Ambulatory Visit (INDEPENDENT_AMBULATORY_CARE_PROVIDER_SITE_OTHER): Payer: No Typology Code available for payment source

## 2024-01-28 DIAGNOSIS — K760 Fatty (change of) liver, not elsewhere classified: Secondary | ICD-10-CM

## 2024-01-28 DIAGNOSIS — R7303 Prediabetes: Secondary | ICD-10-CM

## 2024-01-28 DIAGNOSIS — D72820 Lymphocytosis (symptomatic): Secondary | ICD-10-CM

## 2024-01-28 DIAGNOSIS — E781 Pure hyperglyceridemia: Secondary | ICD-10-CM | POA: Diagnosis not present

## 2024-01-28 LAB — BASIC METABOLIC PANEL
BUN: 9 mg/dL (ref 6–23)
CO2: 24 meq/L (ref 19–32)
Calcium: 8.8 mg/dL (ref 8.4–10.5)
Chloride: 101 meq/L (ref 96–112)
Creatinine, Ser: 0.81 mg/dL (ref 0.40–1.50)
GFR: 110.28 mL/min (ref >60.00)
Glucose, Bld: 108 mg/dL — ABNORMAL HIGH (ref 70–99)
Potassium: 4.1 meq/L (ref 3.5–5.1)
Sodium: 137 meq/L (ref 135–145)

## 2024-01-28 LAB — CBC WITH DIFFERENTIAL/PLATELET
Basophils Absolute: 0.1 10*3/uL (ref 0.0–0.1)
Basophils Relative: 0.6 % (ref 0.0–3.0)
Eosinophils Absolute: 0.6 10*3/uL (ref 0.0–0.7)
Eosinophils Relative: 4.8 % (ref 0.0–5.0)
HCT: 44.4 % (ref 39.0–52.0)
Hemoglobin: 14.8 g/dL (ref 13.0–17.0)
Lymphocytes Relative: 52.1 % — ABNORMAL HIGH (ref 12.0–46.0)
Lymphs Abs: 6.6 10*3/uL — ABNORMAL HIGH (ref 0.7–4.0)
MCHC: 33.4 g/dL (ref 30.0–36.0)
MCV: 90.9 fl (ref 78.0–100.0)
Monocytes Absolute: 1.3 10*3/uL — ABNORMAL HIGH (ref 0.1–1.0)
Monocytes Relative: 10.1 % (ref 3.0–12.0)
Neutro Abs: 4.1 10*3/uL (ref 1.4–7.7)
Neutrophils Relative %: 32.4 % — ABNORMAL LOW (ref 43.0–77.0)
Platelets: 384 10*3/uL (ref 150.0–400.0)
RBC: 4.89 Mil/uL (ref 4.22–5.81)
RDW: 14.3 % (ref 11.5–15.5)
WBC: 12.7 10*3/uL — ABNORMAL HIGH (ref 4.0–10.5)

## 2024-01-28 LAB — HEPATIC FUNCTION PANEL
ALT: 17 U/L (ref 0–53)
AST: 15 U/L (ref 0–37)
Albumin: 4.5 g/dL (ref 3.5–5.2)
Alkaline Phosphatase: 52 U/L (ref 39–117)
Bilirubin, Direct: 0 mg/dL (ref 0.0–0.3)
Total Bilirubin: 0.3 mg/dL (ref 0.2–1.2)
Total Protein: 7.2 g/dL (ref 6.0–8.3)

## 2024-01-28 LAB — LIPID PANEL
Cholesterol: 235 mg/dL — ABNORMAL HIGH (ref 0–200)
HDL: 30.5 mg/dL — ABNORMAL LOW (ref >39.00)
LDL Cholesterol: 143 mg/dL — ABNORMAL HIGH (ref 0–99)
NonHDL: 204.8
Total CHOL/HDL Ratio: 8
Triglycerides: 308 mg/dL — ABNORMAL HIGH (ref 0.0–149.0)
VLDL: 61.6 mg/dL — ABNORMAL HIGH (ref 0.0–40.0)

## 2024-01-28 LAB — HEMOGLOBIN A1C: Hgb A1c MFr Bld: 5.7 % (ref 4.6–6.5)

## 2024-01-29 ENCOUNTER — Encounter: Payer: Self-pay | Admitting: Family Medicine

## 2024-01-29 ENCOUNTER — Other Ambulatory Visit: Payer: Self-pay | Admitting: Family Medicine

## 2024-01-29 MED ORDER — ROSUVASTATIN CALCIUM 10 MG PO TABS
10.0000 mg | ORAL_TABLET | Freq: Every day | ORAL | 1 refills | Status: DC
Start: 2024-01-29 — End: 2024-07-24

## 2024-01-29 NOTE — Progress Notes (Signed)
 Please ask if he is willing to start on a medication called Crestor for his cholesterol. I will send this to his pharmacy. I would like to see him back in 6 weeks (fasting). He should continue Lovaza. Please refill if needed.

## 2024-01-31 ENCOUNTER — Other Ambulatory Visit: Payer: Self-pay | Admitting: Family Medicine

## 2024-01-31 DIAGNOSIS — E781 Pure hyperglyceridemia: Secondary | ICD-10-CM

## 2024-02-28 ENCOUNTER — Ambulatory Visit: Admitting: Family Medicine

## 2024-02-28 ENCOUNTER — Encounter: Payer: Self-pay | Admitting: Family Medicine

## 2024-02-28 VITALS — BP 126/82 | HR 90 | Temp 97.6°F | Ht 72.0 in | Wt 198.0 lb

## 2024-02-28 DIAGNOSIS — R52 Pain, unspecified: Secondary | ICD-10-CM | POA: Diagnosis not present

## 2024-02-28 DIAGNOSIS — J029 Acute pharyngitis, unspecified: Secondary | ICD-10-CM

## 2024-02-28 DIAGNOSIS — H6122 Impacted cerumen, left ear: Secondary | ICD-10-CM

## 2024-02-28 DIAGNOSIS — R590 Localized enlarged lymph nodes: Secondary | ICD-10-CM | POA: Diagnosis not present

## 2024-02-28 NOTE — Patient Instructions (Signed)
 Keep treating your symptoms and follow up if you are getting worse. Otherwise, I will see you back for your follow up.

## 2024-02-28 NOTE — Progress Notes (Signed)
 Subjective:  [name] is a 43 y.o. male who presents for fatigue, body aches, headache, ST that started 5 days ago. He also reports left ear fullness and decreased hearing. Currently he only has tender cervical lymph nodes.   He took Aleve    ROS as in subjective.   Objective: Vitals:   02/28/24 1115  BP: 126/82  Pulse: 90  Temp: 97.6 F (36.4 C)  SpO2: 97%    General appearance: Alert, WD/WN, no distress                             Skin: warm, no rash                           Head: no sinus tenderness                            Eyes: conjunctiva normal, corneas clear, PERRLA                            Ears: left ear with cerumen impaction. Right ear with cerumen but TM normal appearing.                           Nose: septum midline, no discharge              Mouth/throat: MMM, tongue normal, mild pharyngeal erythema                           Neck: supple, no adenopathy, no thyromegaly, nontender                          Heart: RRR                         Lungs: CTA bilaterally, no wheezes, rales, or rhonchi      Assessment: Lymphadenopathy of left cervical region  Acute pharyngitis, unspecified etiology  Body aches  Impacted cerumen of left ear   Plan: Discussed diagnosis and treatment of viral illness. Lymphadenopathy due to illness and should resolve over the next 1-2 weeks.  Suggested symptomatic OTC remedies. Tylenol or Ibuprofen OTC prn.   Call/return if worsening.   Ear lavage done on left ear due to cerumen impaction. Patient verbally consented. Tolerated it well.

## 2024-03-13 ENCOUNTER — Encounter: Payer: Self-pay | Admitting: Family Medicine

## 2024-03-13 ENCOUNTER — Ambulatory Visit: Payer: No Typology Code available for payment source | Admitting: Family Medicine

## 2024-03-13 VITALS — BP 110/80 | HR 76 | Temp 97.6°F | Ht 72.0 in | Wt 192.0 lb

## 2024-03-13 DIAGNOSIS — E781 Pure hyperglyceridemia: Secondary | ICD-10-CM | POA: Diagnosis not present

## 2024-03-13 DIAGNOSIS — D72820 Lymphocytosis (symptomatic): Secondary | ICD-10-CM

## 2024-03-13 DIAGNOSIS — E7849 Other hyperlipidemia: Secondary | ICD-10-CM

## 2024-03-13 LAB — CBC WITH DIFFERENTIAL/PLATELET
Basophils Absolute: 0.1 10*3/uL (ref 0.0–0.1)
Basophils Relative: 0.6 % (ref 0.0–3.0)
Eosinophils Absolute: 0.7 10*3/uL (ref 0.0–0.7)
Eosinophils Relative: 3.9 % (ref 0.0–5.0)
HCT: 43.2 % (ref 39.0–52.0)
Hemoglobin: 14.5 g/dL (ref 13.0–17.0)
Lymphocytes Relative: 38 % (ref 12.0–46.0)
Lymphs Abs: 6.5 10*3/uL — ABNORMAL HIGH (ref 0.7–4.0)
MCHC: 33.7 g/dL (ref 30.0–36.0)
MCV: 90.3 fl (ref 78.0–100.0)
Monocytes Absolute: 1.6 10*3/uL — ABNORMAL HIGH (ref 0.1–1.0)
Monocytes Relative: 9.2 % (ref 3.0–12.0)
Neutro Abs: 8.3 10*3/uL — ABNORMAL HIGH (ref 1.4–7.7)
Neutrophils Relative %: 48.3 % (ref 43.0–77.0)
Platelets: 368 10*3/uL (ref 150.0–400.0)
RBC: 4.78 Mil/uL (ref 4.22–5.81)
RDW: 13.7 % (ref 11.5–15.5)
WBC: 17.1 10*3/uL — ABNORMAL HIGH (ref 4.0–10.5)

## 2024-03-13 LAB — LIPID PANEL
Cholesterol: 184 mg/dL (ref 0–200)
HDL: 36.3 mg/dL — ABNORMAL LOW (ref >39.00)
LDL Cholesterol: 115 mg/dL — ABNORMAL HIGH (ref 0–99)
NonHDL: 147.84
Total CHOL/HDL Ratio: 5
Triglycerides: 162 mg/dL — ABNORMAL HIGH (ref 0.0–149.0)
VLDL: 32.4 mg/dL (ref 0.0–40.0)

## 2024-03-13 LAB — ALT: ALT: 15 U/L (ref 0–53)

## 2024-03-13 NOTE — Progress Notes (Signed)
 Subjective:     Patient ID: [name], male    DOB: 01-26-1981, 43 y.o.   MRN: [id number]  Chief Complaint  Patient presents with   Follow-up    6 weeks f/u. Fasting     HPI   History of Present Illness         Here to follow up on hyperlipidemia and hypertriglyceridemia.  He has been taking Crestor and Lovaza for approximately 6 weeks.  Taking Lovaza once daily.  States his diet has not improved that much.  His wife states he is eating more vegetables.  Reports recovering completely from March when he was sick with sore throat and lymphadenopathy.  No new concerns.     Health Maintenance Due  Topic Date Due   Pneumococcal Vaccine 58-77 Years old (1 of 2 - PCV) Never done    Past Medical History:  Diagnosis Date   Alcohol abuse    Allergy    Anxiety    Anxiety and depression    Benzodiazepine dependence (HCC)    Depression    Difficulty urinating    cant urinate in public places (restrooms)   Diverticulitis    Fatty liver 04/11/2023   GERD (gastroesophageal reflux disease)    Opioid dependence (HCC)    Pilonidal cyst    Seasonal allergies    Skull fracture (HCC) 2004   Trauma   Tension headache    Tubular adenoma of colon     Past Surgical History:  Procedure Laterality Date   ORIF right 3rd finger  12/04/2010   Dr. Merlyn Lot - see hospital notes.   pilonidal cystectomy  08/01/2011   RADIOFREQUENCY ABLATION NERVES  12/10/2012   VASECTOMY  04/03/2010   WISDOM TOOTH EXTRACTION      Family History  Problem Relation Age of Onset   Depression Mother    Anxiety disorder Mother    Alcohol abuse Mother    Hypertension Father    Hyperlipidemia Father    Colon polyps Paternal Grandfather    Cancer Paternal Aunt        breast   Breast cancer Paternal Aunt    Colon cancer Neg Hx    Esophageal cancer Neg Hx    Pancreatic cancer Neg Hx    Stomach cancer Neg Hx    Rectal cancer Neg Hx     Social History   Socioeconomic History   Marital status:  Married    Spouse name: Not on file   Number of children: 0   Years of education: 12   Highest education level: Some college, no degree  Occupational History   Occupation: unemployed  Tobacco Use   Smoking status: Every Day    Types: E-cigarettes   Smokeless tobacco: Never   Tobacco comments:    quit 2 or more years  Vaping Use   Vaping status: Every Day   Substances: Nicotine  Substance and Sexual Activity   Alcohol use: No   Drug use: Yes    Types: Marijuana    Comment: last smoke a "few days ago"   Sexual activity: Not Currently    Partners: Female    Birth control/protection: Surgical, None    Comment: vasectomy  Other Topics Concern   Not on file  Social History Narrative   HSG, attending [employer] (09/2010). Married June 2011.No children. NO history of physical or sexual abuse. Not working at this time.             Social Drivers of Health  Financial Resource Strain: Low Risk  (01/19/2024)   Overall Financial Resource Strain (CARDIA)    Difficulty of Paying Living Expenses: Not very hard  Food Insecurity: No Food Insecurity (01/19/2024)   Hunger Vital Sign    Worried About Running Out of Food in the Last Year: Never true    Ran Out of Food in the Last Year: Never true  Transportation Needs: No Transportation Needs (01/19/2024)   PRAPARE - Administrator, Civil Service (Medical): No    Lack of Transportation (Non-Medical): No  Physical Activity: Unknown (01/19/2024)   Exercise Vital Sign    Days of Exercise per Week: 0 days    Minutes of Exercise per Session: Not on file  Stress: Stress Concern Present (01/19/2024)   [employer] of Occupational Health - Occupational Stress Questionnaire    Feeling of Stress : Very much  Social Connections: Unknown (01/19/2024)   Social Connection and Isolation Panel [NHANES]    Frequency of Communication with Friends and Family: Patient declined    Frequency of Social Gatherings with Friends and Family: Patient  declined    Attends Religious Services: Never    Database administrator or Organizations: No    Attends Engineer, structural: Not on file    Marital Status: Married  Catering manager Violence: Not on file    Outpatient Medications Prior to Visit  Medication Sig Dispense Refill   clonazePAM (KLONOPIN) 1 MG tablet Take 1 mg by mouth QID.     cyclobenzaprine (FLEXERIL) 5 MG tablet Take 1 tablet (5 mg total) by mouth 3 (three) times daily as needed for muscle spasms. 90 tablet 6   Dextromethorphan-quiNIDine (NUEDEXTA) 20-10 MG capsule Take 1 capsule by mouth 2 (two) times daily.     gabapentin (NEURONTIN) 800 MG tablet Take 800 mg by mouth 4 (four) times daily.     hydrOXYzine (VISTARIL) 25 MG capsule Take 75 mg by mouth at bedtime as needed for anxiety.      lamoTRIgine (LAMICTAL) 150 MG tablet Take 150 mg by mouth daily.     MAGNESIUM GLUCONATE PO Take by mouth daily.     melatonin 3 MG TABS tablet Take 3 mg by mouth at bedtime.     nitroGLYCERIN (NITRODUR - DOSED IN MG/24 HR) 0.2 mg/hr patch Apply 1/4 patch daily to tendon for tendonitis. 30 patch 6   omega-3 acid ethyl esters (LOVAZA) 1 g capsule Take 2 capsules (2 g total) by mouth 2 (two) times daily. 360 capsule 0   omeprazole (PRILOSEC) 20 MG capsule Take 1 capsule (20 mg total) by mouth daily. 90 capsule 3   propranolol (INDERAL) 20 MG tablet Take 20 mg by mouth 4 (four) times daily.     rosuvastatin (CRESTOR) 10 MG tablet Take 1 tablet (10 mg total) by mouth daily. 90 tablet 1   No facility-administered medications prior to visit.    Allergies  Allergen Reactions   Avelox [Moxifloxacin Hcl In Nacl] Nausea And Vomiting   Fluoxetine     Intolerant of all SSRI's, NE drugs   Nsaids Other (See Comments)   Other Nausea And Vomiting    Strong antibiotics by mouth / antidepressants   Moxifloxacin Nausea And Vomiting    Review of Systems  Constitutional:  Positive for malaise/fatigue. Negative for chills, fever and weight  loss.  HENT:  Negative for congestion, ear pain and sore throat.   Respiratory:  Negative for cough and shortness of breath.   Cardiovascular:  Negative  for chest pain, palpitations and leg swelling.  Gastrointestinal:  Negative for abdominal pain, constipation, diarrhea, nausea and vomiting.  Genitourinary:  Negative for dysuria, frequency and urgency.  Musculoskeletal:  Negative for myalgias and neck pain.  Neurological:  Negative for dizziness and focal weakness.       Objective:    Physical Exam Constitutional:      General: He is not in acute distress.    Appearance: He is not ill-appearing.  HENT:     Mouth/Throat:     Mouth: Mucous membranes are moist.  Eyes:     Extraocular Movements: Extraocular movements intact.     Conjunctiva/sclera: Conjunctivae normal.  Cardiovascular:     Rate and Rhythm: Normal rate.  Pulmonary:     Effort: Pulmonary effort is normal.  Musculoskeletal:     Cervical back: Normal range of motion and neck supple. No tenderness.  Lymphadenopathy:     Cervical: No cervical adenopathy.  Skin:    General: Skin is warm and dry.  Neurological:     General: No focal deficit present.     Mental Status: He is alert and oriented to person, place, and time.  Psychiatric:        Mood and Affect: Mood normal.        Behavior: Behavior normal.        Thought Content: Thought content normal.      BP 110/80 (BP Location: Left Arm, Patient Position: Sitting)   Pulse 76   Temp 97.6 F (36.4 C) (Temporal)   Ht 6' (1.829 m)   Wt 192 lb (87.1 kg)   SpO2 98%   BMI 26.04 kg/m  Wt Readings from Last 3 Encounters:  03/13/24 192 lb (87.1 kg)  02/28/24 198 lb (89.8 kg)  01/22/24 197 lb (89.4 kg)       Assessment & Plan:   Problem List Items Addressed This Visit     Leukocytosis   Relevant Orders   CBC with Differential/Platelet   Other hyperlipidemia   Relevant Orders   Lipid panel   ALT   Other Visit Diagnoses       Hypertriglyceridemia     -  Primary   Relevant Orders   Lipid panel   ALT      Back to baseline in regards to recent viral illness.  No longer has cervical lymphadenopathy.  Exam benign.  Denies any significant changes to diet.  Reports taking Crestor and Lovaza for the past 6 weeks.  Taking Lovaza once daily.  He is fasting.  Recheck CBC and lipids.  I am having [name] maintain his hydrOXYzine, clonazePAM, Nuedexta, cyclobenzaprine, gabapentin, lamoTRIgine, propranolol, nitroGLYCERIN, melatonin, MAGNESIUM GLUCONATE PO, omeprazole, rosuvastatin, and omega-3 acid ethyl esters.  No orders of the defined types were placed in this encounter.

## 2024-03-13 NOTE — Progress Notes (Signed)
 His triglycerides and LDL have improved.  Please continue the current medications.  Eating a diet low in fat would also help lower these numbers even more. Unfortunately, his white blood cell count is higher.  I would like to recheck this in 2 to 4 weeks and determine if we need to send him back to the hematologist.  Hopefully, the numbers will trend back to his baseline at that time. He can come in for a lab visit and does not have to fast. Please order CBC with diff for lymphocytosis.

## 2024-03-19 ENCOUNTER — Ambulatory Visit: Admitting: Podiatry

## 2024-03-19 ENCOUNTER — Encounter: Payer: Self-pay | Admitting: Podiatry

## 2024-03-19 DIAGNOSIS — L600 Ingrowing nail: Secondary | ICD-10-CM | POA: Diagnosis not present

## 2024-03-19 NOTE — Progress Notes (Signed)
 Subjective:   Patient ID: [name], male   DOB: 43 y.o.   MRN: [id number]   HPI Patient presents with a very painful nail in the right big toe lateral border that is been sore and hard for him to wear shoe gear with it.  States has been trying to soak it neuro   ROS      Objective:  Physical Exam  Vascular status intact with incurvated lateral border right big toe no erythema edema or drainage associated with     Assessment:  Ingrown toenail of the right hallux lateral border painful when pressed     Plan:  H&P reviewed recommended correction of deformity explained procedure risk patient read and signed consent form I infiltrated the right big toe 60 mg like Marcaine mixture sterile prep done using sterile instrumentation remove the lateral border exposed matrix applied phenol 3 applications 30 seconds followed by alcohol and sterile dressing.  Instructions on soaks wear dressing 24 hours take it off earlier if throbbing were to occur and encouraged to call questions concerns

## 2024-03-19 NOTE — Patient Instructions (Signed)

## 2024-03-26 ENCOUNTER — Other Ambulatory Visit: Payer: Self-pay

## 2024-03-26 DIAGNOSIS — D72820 Lymphocytosis (symptomatic): Secondary | ICD-10-CM

## 2024-04-10 ENCOUNTER — Other Ambulatory Visit

## 2024-06-02 NOTE — Progress Notes (Unsigned)
 [name] [address] [place] [address] Phone: [phone] Subjective:   ISusannah Gully, am serving as a scribe for Dr. Arthea Claudene.  I'm seeing this patient by the request  of:  Henson, Vickie L, NP-C  CC: Left heel pain  YEP:Dlagzrupcz  [name] is a 43 y.o. male coming in with complaint of back pain. Last seen March 2023. Patient states here for a check up. Back pain has been tolerable when you're not doing much. When doing activity having low back pain. Ice and flexeril  helps. Heel pain on the L side. Has seen Dr. Joane about this problem. Heel pain flares.       Past Medical History:  Diagnosis Date   Alcohol abuse    Allergy    Anxiety    Anxiety and depression    Benzodiazepine dependence (HCC)    Depression    Difficulty urinating    cant urinate in public places (restrooms)   Diverticulitis    Fatty liver 04/11/2023   GERD (gastroesophageal reflux disease)    Opioid dependence (HCC)    Pilonidal cyst    Seasonal allergies    Skull fracture (HCC) 2004   Trauma   Tension headache    Tubular adenoma of colon    Past Surgical History:  Procedure Laterality Date   ORIF right 3rd finger  12/04/2010   Dr. Murrell - see hospital notes.   pilonidal cystectomy  08/01/2011   RADIOFREQUENCY ABLATION NERVES  12/10/2012   VASECTOMY  04/03/2010   WISDOM TOOTH EXTRACTION     Social History   Socioeconomic History   Marital status: Married    Spouse name: Not on file   Number of children: 0   Years of education: 12   Highest education level: Some college, no degree  Occupational History   Occupation: unemployed  Tobacco Use   Smoking status: Every Day    Types: E-cigarettes   Smokeless tobacco: Never   Tobacco comments:    quit 2 or more years  Vaping Use   Vaping status: Every Day   Substances: Nicotine  Substance and Sexual Activity   Alcohol use: No   Drug use: Yes    Types: Marijuana    Comment: last  smoke a few days ago   Sexual activity: Not Currently    Partners: Female    Birth control/protection: Surgical, None    Comment: vasectomy  Other Topics Concern   Not on file  Social History Narrative   HSG, attending [employer] (09/2010). Married June 2011.No children. NO history of physical or sexual abuse. Not working at this time.             Social Drivers of Corporate investment banker Strain: Low Risk  (01/19/2024)   Overall Financial Resource Strain (CARDIA)    Difficulty of Paying Living Expenses: Not very hard  Food Insecurity: No Food Insecurity (01/19/2024)   Hunger Vital Sign    Worried About Running Out of Food in the Last Year: Never true    Ran Out of Food in the Last Year: Never true  Transportation Needs: No Transportation Needs (01/19/2024)   PRAPARE - Administrator, Civil Service (Medical): No    Lack of Transportation (Non-Medical): No  Physical Activity: Unknown (01/19/2024)   Exercise Vital Sign    Days of Exercise per Week: 0 days    Minutes of Exercise per Session: Not on file  Stress: Stress  Concern Present (01/19/2024)   [employer] of Occupational Health - Occupational Stress Questionnaire    Feeling of Stress : Very much  Social Connections: Unknown (01/19/2024)   Social Connection and Isolation Panel    Frequency of Communication with Friends and Family: Patient declined    Frequency of Social Gatherings with Friends and Family: Patient declined    Attends Religious Services: Never    Database administrator or Organizations: No    Attends Engineer, structural: Not on file    Marital Status: Married   Allergies  Allergen Reactions   Avelox [Moxifloxacin Hcl In Nacl] Nausea And Vomiting   Fluoxetine      Intolerant of all SSRI's, NE drugs   Nsaids Other (See Comments)   Other Nausea And Vomiting    Strong antibiotics by mouth / antidepressants   Moxifloxacin Nausea And Vomiting   Family History  Problem Relation Age of  Onset   Depression Mother    Anxiety disorder Mother    Alcohol abuse Mother    Hypertension Father    Hyperlipidemia Father    Colon polyps Paternal Grandfather    Cancer Paternal Aunt        breast   Breast cancer Paternal Aunt    Colon cancer Neg Hx    Esophageal cancer Neg Hx    Pancreatic cancer Neg Hx    Stomach cancer Neg Hx    Rectal cancer Neg Hx      Current Outpatient Medications (Cardiovascular):    nitroGLYCERIN  (NITRODUR - DOSED IN MG/24 HR) 0.2 mg/hr patch, Apply 1/4 patch daily to tendon for tendonitis.   omega-3 acid ethyl esters (LOVAZA ) 1 g capsule, Take 2 capsules (2 g total) by mouth 2 (two) times daily.   propranolol (INDERAL) 20 MG tablet, Take 20 mg by mouth 4 (four) times daily.   rosuvastatin  (CRESTOR ) 10 MG tablet, Take 1 tablet (10 mg total) by mouth daily.     Current Outpatient Medications (Other):    clonazePAM  (KLONOPIN ) 1 MG tablet, Take 1 mg by mouth QID.   cyclobenzaprine  (FLEXERIL ) 5 MG tablet, Take 1 tablet (5 mg total) by mouth 3 (three) times daily as needed for muscle spasms.   Dextromethorphan-quiNIDine (NUEDEXTA) 20-10 MG capsule, Take 1 capsule by mouth 2 (two) times daily.   gabapentin (NEURONTIN) 800 MG tablet, Take 800 mg by mouth 4 (four) times daily.   hydrOXYzine  (VISTARIL ) 25 MG capsule, Take 75 mg by mouth at bedtime as needed for anxiety.    lamoTRIgine  (LAMICTAL ) 150 MG tablet, Take 150 mg by mouth daily.   MAGNESIUM  GLUCONATE PO, Take by mouth daily.   melatonin 3 MG TABS tablet, Take 3 mg by mouth at bedtime.   omeprazole  (PRILOSEC) 20 MG capsule, Take 1 capsule (20 mg total) by mouth daily.   Reviewed prior external information including notes and imaging from  primary care provider As well as notes that were available from care everywhere and other healthcare systems.  Past medical history, social, surgical and family history all reviewed in electronic medical record.  No pertanent information unless stated regarding  to the chief complaint.   Review of Systems:  No headache, visual changes, nausea, vomiting, diarrhea, constipation, dizziness, abdominal pain, skin rash, fevers, chills, night sweats, weight loss, swollen lymph nodes, body aches, joint swelling, chest pain, shortness of breath, mood changes. POSITIVE muscle aches  Objective  Blood pressure 132/84, pulse 90, height 6' (1.829 m), weight 191 lb (86.6 kg), SpO2 98%.  General: No apparent distress alert and oriented x3 mood and affect normal, dressed appropriately.  HEENT: Pupils equal, extraocular movements intact  Respiratory: Patient's speak in full sentences and does not appear short of breath  Cardiovascular: No lower extremity edema, non tender, no erythema  Low back does have some mild loss lordosis but sitting comfortably.  Patient though does have pain in the left heel.  Does have swelling of the fat pad noted.  Some mild discomfort noted at the insertion of the Achilles but no significant swelling.  Tightness of the posterior capsule of the ankle.  Neurovascularly intact distally.    Impression and Recommendations:     The above documentation has been reviewed and is accurate and complete Jaya Lapka M Amjad Fikes, DO

## 2024-06-03 ENCOUNTER — Other Ambulatory Visit (INDEPENDENT_AMBULATORY_CARE_PROVIDER_SITE_OTHER)

## 2024-06-03 ENCOUNTER — Encounter: Payer: Self-pay | Admitting: Family Medicine

## 2024-06-03 ENCOUNTER — Ambulatory Visit: Payer: Self-pay | Admitting: Family Medicine

## 2024-06-03 ENCOUNTER — Ambulatory Visit: Admitting: Family Medicine

## 2024-06-03 VITALS — BP 132/84 | HR 90 | Ht 72.0 in | Wt 191.0 lb

## 2024-06-03 DIAGNOSIS — M4317 Spondylolisthesis, lumbosacral region: Secondary | ICD-10-CM

## 2024-06-03 DIAGNOSIS — D72820 Lymphocytosis (symptomatic): Secondary | ICD-10-CM | POA: Diagnosis not present

## 2024-06-03 DIAGNOSIS — M79672 Pain in left foot: Secondary | ICD-10-CM

## 2024-06-03 LAB — CBC WITH DIFFERENTIAL/PLATELET
Basophils Absolute: 0.2 10*3/uL — ABNORMAL HIGH (ref 0.0–0.1)
Basophils Relative: 1.4 % (ref 0.0–3.0)
Eosinophils Absolute: 0.6 10*3/uL (ref 0.0–0.7)
Eosinophils Relative: 4.7 % (ref 0.0–5.0)
HCT: 40.7 % (ref 39.0–52.0)
Hemoglobin: 13.7 g/dL (ref 13.0–17.0)
Lymphocytes Relative: 46.1 % — ABNORMAL HIGH (ref 12.0–46.0)
Lymphs Abs: 5.5 10*3/uL — ABNORMAL HIGH (ref 0.7–4.0)
MCHC: 33.6 g/dL (ref 30.0–36.0)
MCV: 88.6 fl (ref 78.0–100.0)
Monocytes Absolute: 1.1 10*3/uL — ABNORMAL HIGH (ref 0.1–1.0)
Monocytes Relative: 9.5 % (ref 3.0–12.0)
Neutro Abs: 4.6 10*3/uL (ref 1.4–7.7)
Neutrophils Relative %: 38.3 % — ABNORMAL LOW (ref 43.0–77.0)
Platelets: 339 10*3/uL (ref 150.0–400.0)
RBC: 4.6 Mil/uL (ref 4.22–5.81)
RDW: 14.1 % (ref 11.5–15.5)
WBC: 12 10*3/uL — ABNORMAL HIGH (ref 4.0–10.5)

## 2024-06-03 MED ORDER — CYCLOBENZAPRINE HCL 5 MG PO TABS: 5.0000 mg | ORAL_TABLET | Freq: Three times a day (TID) | ORAL | 3 refills | Status: AC | PRN

## 2024-06-03 NOTE — Assessment & Plan Note (Signed)
 Patient is 1 more seems to be secondary to heel pain.  Discussed icing regimen and home exercises, discussed which activities to do and which ones to avoid.  Given a brace that I think will be beneficial.  Discussed avoiding being barefoot in the house.  If necessary will consider other medications but hopefully patient does well.  Follow-up with me again in 2 to 3 months

## 2024-06-03 NOTE — Patient Instructions (Addendum)
 You have 14 days to return or exchange your brace Call [phone], then return the brace to our office [employer] recovery [facility] in the house Good to see you! See you again in 3 months if needed

## 2024-06-03 NOTE — Assessment & Plan Note (Signed)
 Seems stable overall.  Given Flexeril .  Refill for this at 5 mg 3 times a day with patient doing very well.  Patient is accompanied with wife and states that seems to be stable overall.  At the moment more of the ankle that is causing him to not be active than truly his back.  Follow-up again 3 to 6 months

## 2024-07-15 NOTE — Progress Notes (Signed)
 Ellouise Console, PA-C [address] [place], [place]  [address] Phone: [phone]   Primary Care Physician: Lendia Boby CROME, NP-C  Primary Gastroenterologist:  Ellouise Console, PA-C / Dr. Gordy Starch   Chief Complaint: Follow-up epigastric pain and GERD   HPI:   [name] is a 43 y.o. male returns for follow-up of epigastric pain, and GERD.   10 days ago he had an episode where he felt like his pills got stuck in his mid chest.  It was very painful.  After that he developed severe hiccups and odynophagia.  Vomited several hours later.  Had severe chest pain after that.  He has had some burning in his throat.  Currently his chest pain is better.  He has not had any difficulty swallowing solid food or liquids.  He has been off omeprazole  for 6 months because it made his indigestion worse.  He denies any recent antibiotic use.  Denies weight loss.  He admits to intermittent episodes of vomiting for several years.  Denies abdominal pain.  No previous EGD.  He occasionally takes OTC Pepcid with some benefit.  Feels like his food is not digesting well.  He is worried about stomach ulcer.  No current NSAID use, however he took a lot of NSAIDs in the past.  He last saw [name], [name] in our office 11/2022 for acid reflux.  Was prescribed omeprazole  20 Mg daily with benefit.  He states he has been off omeprazole  for over 6 months because it made his symptoms worse.  06/2020 colonoscopy by Dr. Starch: No polyps.  Good prep.  Mild diverticulosis.  Repeat colonoscopy age 41.  09/2015 colonoscopy by Dr. Starch: 3 small (3 mm to 6 mm) polyps removed.  1 tubular adenoma and 2 hyperplastic polyps.  No previous EGD.  06/03/2024 Labs: Normal hemoglobin 13.7.  I do not see any previous H. pylori testing.  04/2023 RUQ ultrasound: 1. Increased echogenicity within the liver suggestive of steatosis. 2. No cholelithiasis or sonographic evidence for acute cholecystitis.  Current Outpatient  Medications  Medication Sig Dispense Refill   clonazePAM  (KLONOPIN ) 1 MG tablet Take 1 mg by mouth QID.     cyclobenzaprine  (FLEXERIL ) 5 MG tablet Take 1 tablet (5 mg total) by mouth 3 (three) times daily as needed for muscle spasms. 270 tablet 3   Dextromethorphan-quiNIDine (NUEDEXTA) 20-10 MG capsule Take 1 capsule by mouth 2 (two) times daily.     gabapentin (NEURONTIN) 800 MG tablet Take 800 mg by mouth 4 (four) times daily.     hydrOXYzine  (VISTARIL ) 25 MG capsule Take 75 mg by mouth at bedtime as needed for anxiety.      lamoTRIgine  (LAMICTAL ) 150 MG tablet Take 150 mg by mouth daily.     melatonin 3 MG TABS tablet Take 3 mg by mouth at bedtime.     omega-3 acid ethyl esters (LOVAZA ) 1 g capsule Take 2 capsules (2 g total) by mouth 2 (two) times daily. 360 capsule 0   propranolol (INDERAL) 20 MG tablet Take 20 mg by mouth 4 (four) times daily.     rosuvastatin  (CRESTOR ) 10 MG tablet Take 1 tablet (10 mg total) by mouth daily. 90 tablet 1   No current facility-administered medications for this visit.    Allergies as of 07/16/2024 - Review Complete 07/16/2024  Allergen Reaction Noted   Avelox [moxifloxacin hcl in nacl] Nausea And Vomiting 06/02/2011   Fluoxetine   03/09/2011   Nsaids Other (See  Comments) 12/04/2016   Other Nausea And Vomiting 09/07/2014   Moxifloxacin Nausea And Vomiting 09/29/2010    Past Medical History:  Diagnosis Date   Alcohol abuse    Allergy    Anxiety    Anxiety and depression    Benzodiazepine dependence (HCC)    Depression    Difficulty urinating    cant urinate in public places (restrooms)   Diverticulitis    Fatty liver 04/11/2023   GERD (gastroesophageal reflux disease)    Opioid dependence (HCC)    Pilonidal cyst    Seasonal allergies    Skull fracture (HCC) 2004   Trauma   Tension headache    Tubular adenoma of colon     Past Surgical History:  Procedure Laterality Date   ORIF right 3rd finger  12/04/2010   Dr. Murrell - see hospital  notes.   pilonidal cystectomy  08/01/2011   RADIOFREQUENCY ABLATION NERVES  12/10/2012   VASECTOMY  04/03/2010   WISDOM TOOTH EXTRACTION      Review of Systems:    All systems reviewed and negative except where noted in HPI.    Physical Exam:  BP 100/70 (BP Location: Left Arm, Patient Position: Sitting, Cuff Size: Normal)   Pulse 80   Ht 6' (1.829 m) Comment: height measured without shoes  Wt 190 lb 4 oz (86.3 kg)   BMI 25.80 kg/m  No LMP for male patient.  General: Well-nourished, well-developed in no acute distress.  Lungs: Clear to auscultation bilaterally. Non-labored. Heart: Regular rate and rhythm, no murmurs rubs or gallops.  Abdomen: Bowel sounds are normal; Abdomen is Soft; No hepatosplenomegaly, masses or hernias;  No Abdominal Tenderness; No guarding or rebound tenderness. Neuro: Alert and oriented x 3.  Grossly intact.  Psych: Alert and cooperative, normal mood and affect.   Imaging Studies: No results found.  Labs: CBC    Component Value Date/Time   WBC 12.0 (H) 06/03/2024 1033   RBC 4.60 06/03/2024 1033   HGB 13.7 06/03/2024 1033   HGB 14.6 06/29/2023 1028   HCT 40.7 06/03/2024 1033   PLT 339.0 06/03/2024 1033   PLT 321 06/29/2023 1028   MCV 88.6 06/03/2024 1033   MCH 30.6 06/29/2023 1028   MCHC 33.6 06/03/2024 1033   RDW 14.1 06/03/2024 1033   LYMPHSABS 5.5 (H) 06/03/2024 1033   MONOABS 1.1 (H) 06/03/2024 1033   EOSABS 0.6 06/03/2024 1033   BASOSABS 0.2 (H) 06/03/2024 1033    CMP     Component Value Date/Time   NA 137 01/28/2024 0909   K 4.1 01/28/2024 0909   CL 101 01/28/2024 0909   CO2 24 01/28/2024 0909   GLUCOSE 108 (H) 01/28/2024 0909   BUN 9 01/28/2024 0909   CREATININE 0.81 01/28/2024 0909   CREATININE 0.91 06/29/2023 1028   CALCIUM  8.8 01/28/2024 0909   PROT 7.2 01/28/2024 0909   ALBUMIN 4.5 01/28/2024 0909   AST 15 01/28/2024 0909   AST 17 06/29/2023 1028   ALT 15 03/13/2024 0848   ALT 29 06/29/2023 1028   ALKPHOS 52  01/28/2024 0909   BILITOT 0.3 01/28/2024 0909   BILITOT 0.3 06/29/2023 1028   GFRNONAA >60 06/29/2023 1028   GFRAA >60 09/09/2016 0930     Assessment and Plan:   [name] is a 43 y.o. y/o male presents for:  1.  Dysphagia to pills; odynophagia  2.  Nausea, Vomiting: Episodic 3.  Dyspepsia  Plan: - Scheduling EGD; check biopsies for H. pylori during EGD  I discussed risks of EGD with patient to include risk of bleeding, perforation, and risk of sedation.  Patient expressed understanding and agrees to proceed with EGD.  - Take OTC Pepcid 20mg  BID prn  Ellouise Console, PA-C  Follow up 4 weeks after EGD with TG.

## 2024-07-16 ENCOUNTER — Encounter: Payer: Self-pay | Admitting: Physician Assistant

## 2024-07-16 ENCOUNTER — Ambulatory Visit: Admitting: Physician Assistant

## 2024-07-16 VITALS — BP 100/70 | HR 80 | Ht 72.0 in | Wt 190.2 lb

## 2024-07-16 DIAGNOSIS — R1310 Dysphagia, unspecified: Secondary | ICD-10-CM | POA: Diagnosis not present

## 2024-07-16 DIAGNOSIS — R112 Nausea with vomiting, unspecified: Secondary | ICD-10-CM | POA: Diagnosis not present

## 2024-07-16 DIAGNOSIS — R1013 Epigastric pain: Secondary | ICD-10-CM

## 2024-07-16 NOTE — Patient Instructions (Signed)
 You have been scheduled for an Endoscopy. Please follow written instructions given to you at your visit today.  If you use inhalers (even only as needed), please bring them with you on the day of your procedure.  If you take any of the following medications, they will need to be adjusted prior to your procedure:   DO NOT TAKE 7 DAYS PRIOR TO TEST- Trulicity (dulaglutide) Ozempic, Wegovy (semaglutide) Mounjaro (tirzepatide) Bydureon Bcise (exanatide extended release)  DO NOT TAKE 1 DAY PRIOR TO YOUR TEST Rybelsus (semaglutide) Adlyxin (lixisenatide) Victoza (liraglutide) Byetta (exanatide) ___________________________________________________________________________  Please follow up sooner if symptoms increase or worsen  Due to recent changes in healthcare laws, you may see the results of your imaging and laboratory studies on MyChart before your provider has had a chance to review them.  We understand that in some cases there may be results that are confusing or concerning to you. Not all laboratory results come back in the same time frame and the provider may be waiting for multiple results in order to interpret others.  Please give us  48 hours in order for your provider to thoroughly review all the results before contacting the office for clarification of your results.   Thank you for trusting me with your gastrointestinal care!   Ellouise Console, PA-C _______________________________________________________  If your blood pressure at your visit was 140/90 or greater, please contact your primary care physician to follow up on this.  _______________________________________________________  If you are age 50 or older, your body mass index should be between 23-30. Your Body mass index is 25.8 kg/m. If this is out of the aforementioned range listed, please consider follow up with your Primary Care Provider.  If you are age 78 or younger, your body mass index should be between 19-25. Your  Body mass index is 25.8 kg/m. If this is out of the aformentioned range listed, please consider follow up with your Primary Care Provider.   ________________________________________________________  The [facility] providers would like to encourage you to use MYCHART to communicate with providers for non-urgent requests or questions.  Due to long hold times on the telephone, sending your provider a message by [facility] may be a faster and more efficient way to get a response.  Please allow 48 business hours for a response.  Please remember that this is for non-urgent requests.  _______________________________________________________

## 2024-07-24 ENCOUNTER — Other Ambulatory Visit: Payer: Self-pay | Admitting: Family Medicine

## 2024-07-24 NOTE — Telephone Encounter (Signed)
 Dr. Claudene, RX originally prescribed by Dr. Joane over 1 year ago, would you like to refill?  Last OV 06/03/24 - Dr. Claudene Next OV 09/05/24 - Dr. Claudene  Last OV with Dr. Joane for achilles 06/28/23, has been > 1 year since last visit  Last refill 02/22/23 Qty #30/1

## 2024-08-28 ENCOUNTER — Ambulatory Visit: Admitting: Family Medicine

## 2024-08-28 VITALS — BP 112/60 | HR 70 | Temp 97.6°F | Ht 72.0 in | Wt 193.0 lb

## 2024-08-28 DIAGNOSIS — H9203 Otalgia, bilateral: Secondary | ICD-10-CM | POA: Diagnosis not present

## 2024-08-28 DIAGNOSIS — H9313 Tinnitus, bilateral: Secondary | ICD-10-CM

## 2024-08-28 DIAGNOSIS — R519 Headache, unspecified: Secondary | ICD-10-CM

## 2024-08-28 DIAGNOSIS — H6991 Unspecified Eustachian tube disorder, right ear: Secondary | ICD-10-CM

## 2024-08-28 MED ORDER — AMOXICILLIN-POT CLAVULANATE 875-125 MG PO TABS: 1.0000 | ORAL_TABLET | Freq: Two times a day (BID) | ORAL | 0 refills | Status: DC

## 2024-08-28 MED ORDER — PREDNISONE 20 MG PO TABS: 40.0000 mg | ORAL_TABLET | Freq: Every day | ORAL | 0 refills | Status: DC

## 2024-08-28 NOTE — Progress Notes (Signed)
 Subjective:     Patient ID: [name], male    DOB: November 25, 1981, 43 y.o.   MRN: [id number]  Chief Complaint  Patient presents with   Tinnitus    Ear pain and ear ringing, being in silence helps. Has been going on for couple weeks. Went to UC and got ear washed out and ear drops, did not help at all.     HPI  Discussed the use of AI scribe software for clinical note transcription with the patient, who gave verbal consent to proceed.  History of Present Illness [name] is a 43 year old male who presents with bilateral ear pain and tinnitus.  Otalgia and tinnitus - Bilateral ear pain present, worsened after ear cleaning on August 08, 2024 - Tinnitus described as high-pitched white noise, slightly more prominent in the right ear - Tinnitus sometimes subsides with use of noise-cancelling headphones and silence - Certain sounds exacerbate ear pain - No significant dizziness - Occasional mild right-sided nasal congestion - No fever, chills, or significant respiratory symptoms  Headache and photophobia - Headaches occur intermittently, sometimes severe enough to require lying down - Sensitivity to bright lights, prefers darkness during episodes  Medication intolerances and recent treatments - Ears cleaned at urgent care on August 08, 2024 for impacted cerumen - Prescribed antibiotic and steroid ear drops after ear cleaning, uncertain effectiveness - Avoids certain antibiotics and NSAIDs due to adverse reactions - Tolerates Augmentin  - Occasionally uses Tylenol , which can cause nausea - Inconsistent with hydroxyzine  use, considering Benadryl  Nicotine use - Recently quit vaping     Health Maintenance Due  Topic Date Due   Pneumococcal Vaccine (1 of 2 - PCV) Never done   Hepatitis B Vaccines 19-59 Average Risk (1 of 3 - 19+ 3-dose series) Never done   HPV VACCINES (1 - 3-dose SCDM series) Never done    Past Medical History:  Diagnosis Date   Alcohol abuse     Allergy    Anxiety    Anxiety and depression    Benzodiazepine dependence (HCC)    Depression    Difficulty urinating    cant urinate in public places (restrooms)   Diverticulitis    Fatty liver 04/11/2023   GERD (gastroesophageal reflux disease)    Opioid dependence (HCC)    Pilonidal cyst    Seasonal allergies    Skull fracture (HCC) 2004   Trauma   Tension headache    Tubular adenoma of colon     Past Surgical History:  Procedure Laterality Date   ORIF right 3rd finger  12/04/2010   Dr. Murrell - see hospital notes.   pilonidal cystectomy  08/01/2011   RADIOFREQUENCY ABLATION NERVES  12/10/2012   VASECTOMY  04/03/2010   WISDOM TOOTH EXTRACTION      Family History  Problem Relation Age of Onset   Depression Mother    Anxiety disorder Mother    Alcohol abuse Mother    Hypertension Father    Hyperlipidemia Father    Colon polyps Paternal Grandfather    Cancer Paternal Aunt        breast   Breast cancer Paternal Aunt    Colon cancer Neg Hx    Esophageal cancer Neg Hx    Pancreatic cancer Neg Hx    Stomach cancer Neg Hx    Rectal cancer Neg Hx     Social History   Socioeconomic History   Marital status: Married    Spouse name: Not on file  Number of children: 0   Years of education: 12   Highest education level: 12th grade  Occupational History   Occupation: unemployed  Tobacco Use   Smoking status: Every Day    Types: E-cigarettes   Smokeless tobacco: Never   Tobacco comments:    quit 2 or more years  Vaping Use   Vaping status: Every Day   Substances: Nicotine  Substance and Sexual Activity   Alcohol use: No   Drug use: Yes    Types: Marijuana    Comment: last smoke a few days ago   Sexual activity: Not Currently    Partners: Female    Birth control/protection: Surgical, None    Comment: vasectomy  Other Topics Concern   Not on file  Social History Narrative   HSG, attending [employer] (09/2010). Married June 2011.No children. NO history of  physical or sexual abuse. Not working at this time.             Social Drivers of Corporate investment banker Strain: Low Risk  (08/28/2024)   Overall Financial Resource Strain (CARDIA)    Difficulty of Paying Living Expenses: Not hard at all  Food Insecurity: No Food Insecurity (08/28/2024)   Hunger Vital Sign    Worried About Running Out of Food in the Last Year: Never true    Ran Out of Food in the Last Year: Never true  Transportation Needs: No Transportation Needs (08/28/2024)   PRAPARE - Administrator, Civil Service (Medical): No    Lack of Transportation (Non-Medical): No  Physical Activity: Inactive (08/28/2024)   Exercise Vital Sign    Days of Exercise per Week: 0 days    Minutes of Exercise per Session: Not on file  Stress: Stress Concern Present (08/28/2024)   [employer] of Occupational Health - Occupational Stress Questionnaire    Feeling of Stress: Very much  Social Connections: Unknown (08/28/2024)   Social Connection and Isolation Panel    Frequency of Communication with Friends and Family: Patient declined    Frequency of Social Gatherings with Friends and Family: Patient declined    Attends Religious Services: Never    Database administrator or Organizations: No    Attends Engineer, structural: Not on file    Marital Status: Married  Catering manager Violence: Not on file    Outpatient Medications Prior to Visit  Medication Sig Dispense Refill   clonazePAM  (KLONOPIN ) 1 MG tablet Take 1 mg by mouth QID.     cyclobenzaprine  (FLEXERIL ) 5 MG tablet Take 1 tablet (5 mg total) by mouth 3 (three) times daily as needed for muscle spasms. 270 tablet 3   Dextromethorphan-quiNIDine (NUEDEXTA) 20-10 MG capsule Take 1 capsule by mouth 2 (two) times daily.     gabapentin (NEURONTIN) 800 MG tablet Take 800 mg by mouth 4 (four) times daily.     hydrOXYzine  (VISTARIL ) 25 MG capsule Take 75 mg by mouth at bedtime as needed for anxiety.      lamoTRIgine   (LAMICTAL ) 150 MG tablet Take 150 mg by mouth daily.     melatonin 3 MG TABS tablet Take 3 mg by mouth at bedtime.     omega-3 acid ethyl esters (LOVAZA ) 1 g capsule Take 2 capsules (2 g total) by mouth 2 (two) times daily. 360 capsule 0   propranolol (INDERAL) 20 MG tablet Take 20 mg by mouth 4 (four) times daily.     rosuvastatin  (CRESTOR ) 10 MG tablet Take 1 tablet (  10 mg total) by mouth daily. 90 tablet 1   No facility-administered medications prior to visit.    Allergies  Allergen Reactions   Avelox [Moxifloxacin Hcl In Nacl] Nausea And Vomiting   Fluoxetine      Intolerant of all SSRI's, NE drugs   Nsaids Other (See Comments)   Other Nausea And Vomiting    Strong antibiotics by mouth / antidepressants   Moxifloxacin Nausea And Vomiting    ROS Per HPI    Objective:    Physical Exam Constitutional:      General: He is not in acute distress.    Appearance: He is not ill-appearing.  HENT:     Right Ear: Ear canal and external ear normal. No tenderness. No mastoid tenderness.     Left Ear: Ear canal and external ear normal. No tenderness. No mastoid tenderness.     Ears:     Comments: Clear fluid behind right TM, dull left TM    Nose: Nose normal.     Mouth/Throat:     Mouth: Mucous membranes are moist.     Pharynx: Oropharynx is clear.  Eyes:     General: Lids are normal.     Extraocular Movements: Extraocular movements intact.     Conjunctiva/sclera: Conjunctivae normal.  Neck:     Vascular: No carotid bruit.  Cardiovascular:     Rate and Rhythm: Normal rate and regular rhythm.  Pulmonary:     Effort: Pulmonary effort is normal.     Breath sounds: Normal breath sounds.  Musculoskeletal:        General: Normal range of motion.     Cervical back: Normal range of motion and neck supple. No tenderness.  Lymphadenopathy:     Cervical: No cervical adenopathy.  Skin:    General: Skin is warm and dry.  Neurological:     General: No focal deficit present.     Mental  Status: He is alert and oriented to person, place, and time.     Cranial Nerves: No cranial nerve deficit.     Motor: No weakness.     Coordination: Coordination normal.     Gait: Gait normal.  Psychiatric:        Mood and Affect: Mood normal.        Behavior: Behavior normal.        Thought Content: Thought content normal.      BP 112/60   Pulse 70   Temp 97.6 F (36.4 C) (Temporal)   Ht 6' (1.829 m)   Wt 193 lb (87.5 kg)   SpO2 98%   BMI 26.18 kg/m  Wt Readings from Last 3 Encounters:  08/28/24 193 lb (87.5 kg)  07/16/24 190 lb 4 oz (86.3 kg)  06/03/24 191 lb (86.6 kg)       Assessment & Plan:   Problem List Items Addressed This Visit   None Visit Diagnoses       Eustachian tube dysfunction, right    -  Primary   Relevant Orders   Ambulatory referral to ENT     Tinnitus of both ears       Relevant Orders   Ambulatory referral to ENT     Ear pain, bilateral       Relevant Orders   Ambulatory referral to ENT     Intermittent headache           Assessment and Plan Assessment & Plan Eustachian tube dysfunction with bilateral ear pain and tinnitus Eustachian tube dysfunction  with bilateral ear pain and tinnitus, more pronounced on the right side. Symptoms include high-pitched tinnitus, ear pain, and clear fluid in the right ear. No significant congestion, fever, or chills. Eardrums appear dull, worse on left.  Noise-cancelling headphones provide relief -No red flag symptoms. No pulsatile or vascular symptoms.  - Prescribe Augmentin  (amoxicillin /clavulanate) with food to minimize gastrointestinal side effects. - Prescribe a short course of prednisone  - Refer to ENT for further evaluation due to tinnitus and potential for persistent symptoms. - Advise trying Benadryl or hydroxyzine  to see if it helps with symptoms, but not both simultaneously.  Headache associated with ear symptoms Headache associated with ear symptoms, possibly related to Eustachian tube  dysfunction. Headaches occur after prolonged exposure to noise or conversation, sometimes accompanied by light sensitivity. No significant dizziness reported. Experiences nausea or vomiting with Tylenol , and NSAIDs are generally avoided due to past issues. - Recommend trying Tylenol  for headache relief, but note potential for nausea or vomiting with use. - Advise against NSAIDs due to past issues  Strict precautions to follow up for any new or worsening symptoms.   Congratulated him on stopping vaping.      I am having [name] start on predniSONE  and amoxicillin -clavulanate. I am also having him maintain his hydrOXYzine , clonazePAM , Nuedexta, gabapentin, lamoTRIgine , propranolol, melatonin, omega-3 acid ethyl esters, cyclobenzaprine , and rosuvastatin .  Meds ordered this encounter  Medications   predniSONE  (DELTASONE ) 20 MG tablet    Sig: Take 2 tablets (40 mg total) by mouth daily with breakfast.    Dispense:  10 tablet    Refill:  0    Supervising Provider:   ROLLENE NORRIS A [[id number]]   amoxicillin -clavulanate (AUGMENTIN ) 875-125 MG tablet    Sig: Take 1 tablet by mouth 2 (two) times daily.    Dispense:  20 tablet    Refill:  0    Supervising Provider:   ROLLENE NORRIS A [[id number]]

## 2024-09-04 ENCOUNTER — Institutional Professional Consult (permissible substitution) (INDEPENDENT_AMBULATORY_CARE_PROVIDER_SITE_OTHER): Admitting: Physician Assistant

## 2024-09-04 ENCOUNTER — Ambulatory Visit: Payer: Self-pay

## 2024-09-04 NOTE — Telephone Encounter (Signed)
 FYI Only or Action Required?: FYI only for provider.  Patient was last seen in primary care on 08/28/2024 by Lendia Boby CROME, NP-C.  Called Nurse Triage reporting Medication possible side effect  Symptoms began today (nausea/vomiting/stomach pain .  Interventions attempted: Nothing.  Symptoms are: stable.  Triage Disposition:call PCP now Patient/caregiver understands and will follow disposition?: yes      Copied from CRM #[id number]. Topic: Clinical - Red Word Triage >> Sep 04, 2024  8:09 AM Adelita E wrote: [facility] that prompted transfer to Nurse Triage: Patient has been on amoxicillin -clavulanate (AUGMENTIN ) 875-125 MG tablet which is making him more sick, throwing up and feeling nauseous, complaining of stomach pain as well. Spouse Karyn on the phone. Answer Assessment - Initial Assessment Questions 1. NAME of MEDICINE: What medicine(s) are you calling about?     Augmentin  08/28/24 2. QUESTION: What is your question? (e.g., double dose of medicine, side effect)     Side effect 3. PRESCRIBER: Who prescribed the medicine? Reason: if prescribed by specialist, call should be referred to that group.     PCP 4. SYMPTOMS: Do you have any symptoms? If Yes, ask: What symptoms are you having?  How bad are the symptoms (e.g., mild, moderate, severe)     Vomiting, nausea, stomach pain-  Protocols used: Medication Question Call-A-AH

## 2024-09-04 NOTE — Telephone Encounter (Signed)
 Called and spoke w wife, was able to relay to her pcp has advised to stop the abx and hydrate. Wife verbalized understanding

## 2024-09-04 NOTE — Telephone Encounter (Signed)
 Patient has been on amoxicillin -clavulanate (AUGMENTIN ) 875-125 MG tablet which is making him more sick, throwing up and feeling nauseous, complaining of stomach pain as well.

## 2024-09-05 ENCOUNTER — Ambulatory Visit: Admitting: Family Medicine

## 2024-09-06 ENCOUNTER — Emergency Department (HOSPITAL_COMMUNITY): Admission: EM | Admit: 2024-09-06 | Discharge: 2024-09-06 | Discharge status: Against Medical Advice

## 2024-09-06 ENCOUNTER — Other Ambulatory Visit: Payer: Self-pay

## 2024-09-06 DIAGNOSIS — Z5329 Procedure and treatment not carried out because of patient's decision for other reasons: Secondary | ICD-10-CM | POA: Diagnosis not present

## 2024-09-06 DIAGNOSIS — R112 Nausea with vomiting, unspecified: Secondary | ICD-10-CM | POA: Diagnosis present

## 2024-09-06 LAB — COMPREHENSIVE METABOLIC PANEL WITH GFR
ALT: 34 U/L (ref 0–44)
AST: 22 U/L (ref 15–41)
Albumin: 4.8 g/dL (ref 3.5–5.0)
Alkaline Phosphatase: 71 U/L (ref 38–126)
Anion gap: 15 (ref 5–15)
BUN: 9 mg/dL (ref 6–20)
CO2: 23 mmol/L (ref 22–32)
Calcium: 9.7 mg/dL (ref 8.9–10.3)
Chloride: 97 mmol/L — ABNORMAL LOW (ref 98–111)
Creatinine, Ser: 0.69 mg/dL (ref 0.61–1.24)
GFR, Estimated: >60 mL/min (ref >60)
Glucose, Bld: 115 mg/dL — ABNORMAL HIGH (ref 70–99)
Potassium: 3.5 mmol/L (ref 3.5–5.1)
Sodium: 135 mmol/L (ref 135–145)
Total Bilirubin: 0.5 mg/dL (ref 0.0–1.2)
Total Protein: 7.6 g/dL (ref 6.5–8.1)

## 2024-09-06 LAB — LIPASE, BLOOD: Lipase: 30 U/L (ref 11–51)

## 2024-09-06 LAB — CBC
HCT: 46.5 % (ref 39.0–52.0)
Hemoglobin: 15.3 g/dL (ref 13.0–17.0)
MCH: 28.9 pg (ref 26.0–34.0)
MCHC: 32.9 g/dL (ref 30.0–36.0)
MCV: 87.9 fL (ref 80.0–100.0)
Platelets: 408 K/uL — ABNORMAL HIGH (ref 150–400)
RBC: 5.29 MIL/uL (ref 4.22–5.81)
RDW: 13.4 % (ref 11.5–15.5)
WBC: 22.2 K/uL — ABNORMAL HIGH (ref 4.0–10.5)
nRBC: 0 % (ref 0.0–0.2)

## 2024-09-06 MED ORDER — DIPHENHYDRAMINE HCL 50 MG/ML IJ SOLN
INTRAMUSCULAR | Status: AC
  Administered 2024-09-06: 25 mg via INTRAVENOUS
  Filled 2024-09-06: qty 1

## 2024-09-06 MED ORDER — CLONAZEPAM 0.5 MG PO TABS
1.0000 mg | ORAL_TABLET | ORAL | Status: AC
  Administered 2024-09-06: 1 mg via ORAL
  Filled 2024-09-06: qty 2

## 2024-09-06 MED ORDER — DIPHENHYDRAMINE HCL 50 MG/ML IJ SOLN: 25.0000 mg | Freq: Once | INTRAMUSCULAR | Status: AC

## 2024-09-06 MED ORDER — HALOPERIDOL LACTATE 5 MG/ML IJ SOLN
5.0000 mg | Freq: Once | INTRAMUSCULAR | Status: AC
  Administered 2024-09-06: 5 mg via INTRAVENOUS
  Filled 2024-09-06: qty 1

## 2024-09-06 MED ORDER — LACTATED RINGERS IV BOLUS
1000.0000 mL | Freq: Once | INTRAVENOUS | Status: AC
  Administered 2024-09-06: 1000 mL via INTRAVENOUS

## 2024-09-06 NOTE — ED Notes (Signed)
 Patient removed his IV and said,  I have to get out of here. This Paramedic explained to him that we could help him if he leaves. Patient said,  I signed all the things to leave, I understand Im leaving.

## 2024-09-06 NOTE — ED Provider Notes (Signed)
  EMERGENCY DEPARTMENT AT [facility] Provider Note   CSN: [id number] Arrival date & time: 09/06/24  9395     Patient presents with: Emesis   AAYANSH CODISPOTI is a 43 y.o. male.    Emesis Patient has had nausea and vomiting since Thursday who he states is nonbloody nonbilious.  He vomits after eating or drinking anything.  He denies any significant abdominal pain or chest pain difficulty breathing.  No head injuries or loss of consciousness no slurred speech confusion or vision changes.  He denies any difficulty walking or with movements he denies any limb weakness.  He states that his primary concern is that he has been unable to take his Klonopin  which he takes for daily, Vistaril , Lamictal , propranolol all of which he takes for anxiety.  He states he is feeling very anxious jittery and worried.  Denies SI or HI.     Prior to Admission medications   Medication Sig Start Date End Date Taking? Authorizing Provider  amoxicillin -clavulanate (AUGMENTIN ) 875-125 MG tablet Take 1 tablet by mouth 2 (two) times daily. 08/28/24   Henson, Vickie L, NP-C  clonazePAM  (KLONOPIN ) 1 MG tablet Take 1 mg by mouth QID.    [provider]  cyclobenzaprine  (FLEXERIL ) 5 MG tablet Take 1 tablet (5 mg total) by mouth 3 (three) times daily as needed for muscle spasms. 06/03/24   Smith, Zachary M, DO  Dextromethorphan-quiNIDine (NUEDEXTA) 20-10 MG capsule Take 1 capsule by mouth 2 (two) times daily.    [provider]  gabapentin (NEURONTIN) 800 MG tablet Take 800 mg by mouth 4 (four) times daily. 08/27/22   [provider]  hydrOXYzine  (VISTARIL ) 25 MG capsule Take 75 mg by mouth at bedtime as needed for anxiety.     [provider]  lamoTRIgine  (LAMICTAL ) 150 MG tablet Take 150 mg by mouth daily. 08/11/22   [provider]  melatonin 3 MG TABS tablet Take 3 mg by mouth at bedtime.    [provider]  omega-3 acid ethyl esters (LOVAZA ) 1 g  capsule Take 2 capsules (2 g total) by mouth 2 (two) times daily. 02/01/24   Henson, Vickie L, NP-C  predniSONE  (DELTASONE ) 20 MG tablet Take 2 tablets (40 mg total) by mouth daily with breakfast. 08/28/24   Henson, Vickie L, NP-C  propranolol (INDERAL) 20 MG tablet Take 20 mg by mouth 4 (four) times daily. 08/27/22   [provider]  rosuvastatin  (CRESTOR ) 10 MG tablet Take 1 tablet (10 mg total) by mouth daily. 07/24/24   Lendia Boby CROME, NP-C    Allergies: Avelox [moxifloxacin hcl in nacl], Fluoxetine , Nsaids, Other, and Moxifloxacin    Review of Systems  Gastrointestinal:  Positive for vomiting.    Updated Vital Signs BP (!) 143/92   Pulse 100   Temp 98.1 F (36.7 C) (Oral)   Resp 20   SpO2 96%   Physical Exam Vitals and nursing note reviewed.  Constitutional:      General: He is not in acute distress. HENT:     Head: Normocephalic and atraumatic.     Nose: Nose normal.     Mouth/Throat:     Mouth: Mucous membranes are dry.  Eyes:     General: No scleral icterus. Cardiovascular:     Rate and Rhythm: Normal rate and regular rhythm.     Pulses: Normal pulses.     Heart sounds: Normal heart sounds.  Pulmonary:     Effort: Pulmonary effort is normal.  No respiratory distress.     Breath sounds: No wheezing.  Abdominal:     Palpations: Abdomen is soft.     Tenderness: There is no abdominal tenderness. There is no guarding or rebound.  Musculoskeletal:     Cervical back: Normal range of motion.     Right lower leg: No edema.     Left lower leg: No edema.  Skin:    General: Skin is warm and dry.     Capillary Refill: Capillary refill takes less than 2 seconds.  Neurological:     Mental Status: He is alert. Mental status is at baseline.  Psychiatric:     Comments: Anxiety      (all labs ordered are listed, but only abnormal results are displayed) Labs Reviewed  COMPREHENSIVE METABOLIC PANEL WITH GFR - Abnormal; Notable for the following components:       Result Value   Chloride 97 (*)    Glucose, Bld 115 (*)    All other components within normal limits  CBC - Abnormal; Notable for the following components:   WBC 22.2 (*)    Platelets 408 (*)    All other components within normal limits  LIPASE, BLOOD  URINALYSIS, ROUTINE W REFLEX MICROSCOPIC    EKG: EKG Interpretation Date/Time:  Saturday September 06 2024 07:37:23 EDT Ventricular Rate:  87 PR Interval:    QRS Duration:  111 QT Interval:  370 QTC Calculation: 446 R Axis:   93  Text Interpretation: Borderline right axis deviation Confirmed by Simon Rea [phone]) on 09/06/2024 7:51:46 AM  Radiology: No results found.   Procedures   Medications Ordered in the ED  haloperidol lactate (HALDOL) injection 5 mg (has no administration in time range)  lactated ringers bolus 1,000 mL (1,000 mLs Intravenous New Bag/Given 09/06/24 0749)                                    Medical Decision Making Amount and/or Complexity of Data Reviewed Labs: ordered.  Risk Prescription drug management.   Patient has had nausea and vomiting since Thursday who he states is nonbloody nonbilious.  He vomits after eating or drinking anything.  He denies any significant abdominal pain or chest pain difficulty breathing.  No head injuries or loss of consciousness no slurred speech confusion or vision changes.  He denies any difficulty walking or with movements he denies any limb weakness.  He states that his primary concern is that he has been unable to take his Klonopin  which he takes for daily, Vistaril , Lamictal , propranolol all of which he takes for anxiety.  He states he is feeling very anxious jittery and worried.  Denies SI or HI.  Anxious, dry oral mucosa.   Labs w/ leukocytosis likely from forceful emesis.  CMP nml  Lipase nml  Patient left AMA after receiving lactated Ringer's, Benadryl, Haldol  Patient has had no episodes of emesis after receiving medications and prior to leaving  AMA   Final diagnoses:  Nausea and vomiting, unspecified vomiting type    ED Discharge Orders     None          Neldon Hamp RAMAN, [place] 09/06/24 9041    Simon Rea SAILOR, MD 09/06/24 1534

## 2024-09-06 NOTE — ED Triage Notes (Signed)
 Pt reports vomiting since Thursday after 6.5 days of augmentin . Has not been able to keep anything downs since then including mental health meds.

## 2024-09-09 ENCOUNTER — Ambulatory Visit (INDEPENDENT_AMBULATORY_CARE_PROVIDER_SITE_OTHER): Admitting: Physician Assistant

## 2024-09-09 VITALS — BP 113/78 | HR 85 | Temp 99.1°F | Ht 72.0 in | Wt 195.0 lb

## 2024-09-09 DIAGNOSIS — H93239 Hyperacusis, unspecified ear: Secondary | ICD-10-CM | POA: Diagnosis not present

## 2024-09-09 DIAGNOSIS — H9313 Tinnitus, bilateral: Secondary | ICD-10-CM

## 2024-09-09 DIAGNOSIS — H9319 Tinnitus, unspecified ear: Secondary | ICD-10-CM | POA: Diagnosis not present

## 2024-09-09 DIAGNOSIS — H93233 Hyperacusis, bilateral: Secondary | ICD-10-CM

## 2024-09-09 NOTE — Progress Notes (Signed)
 Dear Dr. Lendia, Here is my assessment for our mutual patient, [name]. Thank you for allowing me the opportunity to care for your patient. Please do not hesitate to contact me should you have any other questions. Sincerely, Chyrl Cohen PA-C  Otolaryngology Clinic Note Referring provider: Dr. Lendia HPI:  [name] is a 43 y.o. male kindly referred by Dr. Lendia   The patient is a 43 year old gentleman seen in our office for evaluation of ear related complaints.  The patient notes that he has had an ongoing history of bilateral tonal tinnitus.  He notes that in September he had his ears cleaned out from cerumen impaction.  Since that time he has had hypersensitivity to noise.  He has been wearing noise canceling headphones, he notes this is symmetric on both sides.  He denies any preceding history of trauma to the ears, no preceding history of recurrent ear infections.    Independent Review of Additional Tests or Records:  None   PMH/Meds/All/SocHx/FamHx/ROS:   Past Medical History:  Diagnosis Date   Alcohol abuse    Allergy    Anxiety    Anxiety and depression    Benzodiazepine dependence (HCC)    Depression    Difficulty urinating    cant urinate in public places (restrooms)   Diverticulitis    Fatty liver 04/11/2023   GERD (gastroesophageal reflux disease)    Opioid dependence (HCC)    Pilonidal cyst    Seasonal allergies    Skull fracture (HCC) 2004   Trauma   Tension headache    Tubular adenoma of colon      Past Surgical History:  Procedure Laterality Date   ORIF right 3rd finger  12/04/2010   Dr. Murrell - see hospital notes.   pilonidal cystectomy  08/01/2011   RADIOFREQUENCY ABLATION NERVES  12/10/2012   VASECTOMY  04/03/2010   WISDOM TOOTH EXTRACTION      Family History  Problem Relation Age of Onset   Depression Mother    Anxiety disorder Mother    Alcohol abuse Mother    Hypertension Father    Hyperlipidemia Father    Colon polyps Paternal  Grandfather    Cancer Paternal Aunt        breast   Breast cancer Paternal Aunt    Colon cancer Neg Hx    Esophageal cancer Neg Hx    Pancreatic cancer Neg Hx    Stomach cancer Neg Hx    Rectal cancer Neg Hx      Social Connections: Unknown (08/28/2024)   Social Connection and Isolation Panel    Frequency of Communication with Friends and Family: Patient declined    Frequency of Social Gatherings with Friends and Family: Patient declined    Attends Religious Services: Never    Database administrator or Organizations: No    Attends Engineer, structural: Not on file    Marital Status: Married      Current Outpatient Medications:    clonazePAM  (KLONOPIN ) 1 MG tablet, Take 1 mg by mouth QID., Disp: , Rfl:    cyclobenzaprine  (FLEXERIL ) 5 MG tablet, Take 1 tablet (5 mg total) by mouth 3 (three) times daily as needed for muscle spasms., Disp: 270 tablet, Rfl: 3   Dextromethorphan-quiNIDine (NUEDEXTA) 20-10 MG capsule, Take 1 capsule by mouth 2 (two) times daily., Disp: , Rfl:    gabapentin (NEURONTIN) 800 MG tablet, Take 800 mg by mouth 4 (four) times daily., Disp: , Rfl:    hydrOXYzine  (VISTARIL )  25 MG capsule, Take 75 mg by mouth at bedtime as needed for anxiety. , Disp: , Rfl:    lamoTRIgine  (LAMICTAL ) 150 MG tablet, Take 150 mg by mouth daily., Disp: , Rfl:    melatonin 3 MG TABS tablet, Take 3 mg by mouth at bedtime., Disp: , Rfl:    omega-3 acid ethyl esters (LOVAZA ) 1 g capsule, Take 2 capsules (2 g total) by mouth 2 (two) times daily., Disp: 360 capsule, Rfl: 0   propranolol (INDERAL) 20 MG tablet, Take 20 mg by mouth 4 (four) times daily., Disp: , Rfl:    rosuvastatin  (CRESTOR ) 10 MG tablet, Take 1 tablet (10 mg total) by mouth daily., Disp: 90 tablet, Rfl: 1   amoxicillin -clavulanate (AUGMENTIN ) 875-125 MG tablet, Take 1 tablet by mouth 2 (two) times daily. (Patient not taking: Reported on 09/09/2024), Disp: 20 tablet, Rfl: 0   predniSONE  (DELTASONE ) 20 MG tablet, Take 2  tablets (40 mg total) by mouth daily with breakfast. (Patient not taking: Reported on 09/09/2024), Disp: 10 tablet, Rfl: 0   Physical Exam:   BP 113/78 (BP Location: Right Arm, Patient Position: Sitting)   Pulse 85   Temp 99.1 F (37.3 C)   Ht 6' (1.829 m)   Wt 195 lb (88.5 kg)   SpO2 93%   BMI 26.45 kg/m   Pertinent Findings  CN II-XII intact Bilateral EAC clear and TM intact with well pneumatized middle ear spaces Weber 512: equal Rinne 512: AC > BC b/l  Anterior rhinoscopy: Midline, no significant turbinate hypertrophy No lesions of oral cavity/oropharynx; dentition within normal limits No obviously palpable neck masses/lymphadenopathy/thyromegaly No respiratory distress or stridor  Seprately Identifiable Procedures:  None  Impression & Plans:  [name] is a 43 y.o. male with the following   Hyperacusis-  Patient following up today for tinnitus and hyperacusis.  Symptoms worsened after cerumen removal.  Add like audiological evaluation for further management.  Once the results are available I will call him and discuss a plan moving forward.   - f/u phone call discussion after audiological evaluation   Thank you for allowing me the opportunity to care for your patient. Please do not hesitate to contact me should you have any other questions.  Sincerely, Chyrl Cohen PA-C [facility] Phone: [phone] Fax: [phone]  09/09/2024, 1:38 PM

## 2024-09-12 ENCOUNTER — Encounter: Admitting: Internal Medicine

## 2024-09-15 ENCOUNTER — Ambulatory Visit (INDEPENDENT_AMBULATORY_CARE_PROVIDER_SITE_OTHER): Admitting: Audiology

## 2024-09-15 ENCOUNTER — Telehealth (INDEPENDENT_AMBULATORY_CARE_PROVIDER_SITE_OTHER): Payer: Self-pay | Admitting: Physician Assistant

## 2024-09-15 DIAGNOSIS — Z0110 Encounter for examination of ears and hearing without abnormal findings: Secondary | ICD-10-CM | POA: Diagnosis not present

## 2024-09-15 DIAGNOSIS — H93293 Other abnormal auditory perceptions, bilateral: Secondary | ICD-10-CM

## 2024-09-15 NOTE — Patient Instructions (Signed)
 [facility] Tinnitus [facility] [email or web address] > [email or web address] May 28, 2018 -- [facility], [address]  [address]  [place] [address]  map, [phone]  Fax [phone], Mon-Fri

## 2024-09-15 NOTE — Telephone Encounter (Signed)
 I spoke with the patient's wife who was present at his last visit.  Reiterated the audiological results that were given at that time.  I did give them information for [facility] tinnitus clinic.  She verbalized understanding and agreement to today's plan.

## 2024-09-15 NOTE — Progress Notes (Signed)
  [address], [address] [place], [place] [address] [phone]  Audiological Evaluation    Name: [name]     DOB:   12/25/1980      MRN:   [id number]                                                                                     Service Date: 09/15/2024     Accompanied by: wife   Patient comes today after Reyes Cohen, PA-C sent a referral for a hearing evaluation due to concerns with tinnitus.   Symptoms Yes Details  Hearing loss  []    Tinnitus  [x]  Both ears, reports it is exacerbated with noise  Ear pain/ infections/pressure  [x]  Feels ear pain that may be exacerbated with noise  Balance problems  [x]  Feels off balance when walking or while on his feet  Noise exposure history  [x]  music  Previous ear surgeries  []    Family history of hearing loss  [x]  Grandparents, with age  Amplification  []    Other  [x]  Reports is using noise cancelling headphones during the day    Otoscopy: Right ear: Clear external ear canal and notable landmarks visualized on the tympanic membrane. Left ear:  Clear external ear canal and notable landmarks visualized on the tympanic membrane.  Tympanometry: Right ear: Type A- Normal external ear canal volume with normal middle ear pressure and tympanic membrane compliance. Left ear: Type A- Normal external ear canal volume with normal middle ear pressure and tympanic membrane compliance.  Distortion Product Otoacoustic Emissions: Frequencies tested 1.6-8kHz - Diagnostic Test (12 frequencies)   Results are considered present if DP-NF is above 6dB and DP isabove -10dB. Right ear: Present from 1600Hz  -7100Hz  and absent at 8000 Hz.  Where absent is suggestive of abnormal outer hair cell function for that frequency range. Left ear: Could not be determined - could not achieve a good seal. Attempted different probe sizes.  Pure tone Audiometry: Both ears- Normal hearing from 125 Hz - 8000 Hz.  Speech Audiometry: Right ear- Speech  Reception Threshold (SRT) was obtained at 5 dBHL. Left ear-Speech Reception Threshold (SRT) was obtained at 5 dBHL.   Word Recognition Score Tested using NU-6 (recorded) Right ear: 100% was obtained at a presentation level of 50 dBHL with contralateral masking which is deemed as  excellent. Left ear: 100% was obtained at a presentation level of 50 dBHL with contralateral masking which is deemed as  excellent.   The hearing test results were completed under headphones and results are deemed to be of good reliability. Test technique:  conventional    Impression: There is not a significant difference in pure-tone thresholds between ears. There is not a significant difference in the word recognition score in between ears.    Recommendations: Follow up with ENT as scheduled for today. Return for a hearing evaluation if concerns with hearing changes arise or per MD recommendation. Consider follow up at [facility] tinnitus clinic for various tinnitus strategies, including the use of a sound generator, hearing aids, and/or tinnitus retraining therapy.    Shabazz Mckey MARIE LEROUX-MARTINEZ, AUD

## 2024-09-23 ENCOUNTER — Encounter: Payer: Self-pay | Admitting: Audiology

## 2024-10-02 ENCOUNTER — Ambulatory Visit: Admitting: Internal Medicine

## 2024-10-02 ENCOUNTER — Encounter: Payer: Self-pay | Admitting: Internal Medicine

## 2024-10-02 VITALS — BP 99/64 | HR 72 | Temp 98.3°F | Resp 15 | Ht 72.0 in | Wt 190.0 lb

## 2024-10-02 DIAGNOSIS — R1013 Epigastric pain: Secondary | ICD-10-CM

## 2024-10-02 DIAGNOSIS — R112 Nausea with vomiting, unspecified: Secondary | ICD-10-CM

## 2024-10-02 DIAGNOSIS — K319 Disease of stomach and duodenum, unspecified: Secondary | ICD-10-CM | POA: Diagnosis not present

## 2024-10-02 DIAGNOSIS — R1310 Dysphagia, unspecified: Secondary | ICD-10-CM

## 2024-10-02 MED ORDER — SODIUM CHLORIDE 0.9 % IV SOLN: 500.0000 mL | Freq: Once | INTRAVENOUS | Status: DC

## 2024-10-02 MED ORDER — FLUCONAZOLE 100 MG PO TABS: 100.0000 mg | ORAL_TABLET | Freq: Every day | ORAL | 0 refills | Status: DC

## 2024-10-02 NOTE — Patient Instructions (Addendum)
 Start Fluconazole 400 mg x 1 day and the 200 mg x 13 days for candida esophagitis. Awaiting pathology results Clinical symptoms consistent with Gastroparesis, awaiting biopsy results and if persistent symptoms 4 hour GES is recommended.   YOU HAD AN ENDOSCOPIC PROCEDURE TODAY AT THE [facility]:   Refer to the procedure report that was given to you for any specific questions about what was found during the examination.  If the procedure report does not answer your questions, please call your gastroenterologist to clarify.  If you requested that your care partner not be given the details of your procedure findings, then the procedure report has been included in a sealed envelope for you to review at your convenience later.  YOU SHOULD EXPECT: Some feelings of bloating in the abdomen. Passage of more gas than usual.  Walking can help get rid of the air that was put into your GI tract during the procedure and reduce the bloating. If you had a lower endoscopy (such as a colonoscopy or flexible sigmoidoscopy) you may notice spotting of blood in your stool or on the toilet paper. If you underwent a bowel prep for your procedure, you may not have a normal bowel movement for a few days.  Please Note:  You might notice some irritation and congestion in your nose or some drainage.  This is from the oxygen used during your procedure.  There is no need for concern and it should clear up in a day or so.  SYMPTOMS TO REPORT IMMEDIATELY:  Following upper endoscopy (EGD)  Vomiting of blood or coffee ground material  New chest pain or pain under the shoulder blades  Painful or persistently difficult swallowing  New shortness of breath  Fever of 100F or higher  Black, tarry-looking stools  For urgent or emergent issues, a gastroenterologist can be reached at any hour by calling [phone]. Do not use MyChart messaging for urgent concerns.    DIET:  We do recommend a small meal at first, but  then you may proceed to your regular diet.  Drink plenty of fluids but you should avoid alcoholic beverages for 24 hours.  ACTIVITY:  You should plan to take it easy for the rest of today and you should NOT DRIVE or use heavy machinery until tomorrow (because of the sedation medicines used during the test).    FOLLOW UP: Our staff will call the number listed on your records the next business day following your procedure.  We will call around 7:15- 8:00 am to check on you and address any questions or concerns that you may have regarding the information given to you following your procedure. If we do not reach you, we will leave a message.     If any biopsies were taken you will be contacted by phone or by letter within the next 1-3 weeks.  Please call us  at [phone] if you have not heard about the biopsies in 3 weeks.    SIGNATURES/CONFIDENTIALITY: You and/or your care partner have signed paperwork which will be entered into your electronic medical record.  These signatures attest to the fact that that the information above on your After Visit Summary has been reviewed and is understood.  Full responsibility of the confidentiality of this discharge information lies with you and/or your care-partner.

## 2024-10-02 NOTE — Progress Notes (Signed)
 To PACU via stretcher, sedated, good respiratory effort, VSS.

## 2024-10-02 NOTE — Op Note (Signed)
 [facility] Patient Name: [name] Procedure Date: 10/02/2024 10:02 AM MRN: [id number] Endoscopist: Gordy CHRISTELLA Starch , MD, [id number] Age: 43 Referring MD:  Date of Birth: 14-Jul-1981 Gender: Male Account #: [id number] Procedure:                Upper GI endoscopy Indications:              Indigestion, Dyspepsia, Nausea with vomiting Medicines:                Monitored Anesthesia Care Procedure:                Pre-Anesthesia Assessment:                           - Prior to the procedure, a History and Physical                            was performed, and patient medications and                            allergies were reviewed. The patient's tolerance of                            previous anesthesia was also reviewed. The risks                            and benefits of the procedure and the sedation                            options and risks were discussed with the patient.                            All questions were answered, and informed consent                            was obtained. Prior Anticoagulants: The patient has                            taken no anticoagulant or antiplatelet agents. ASA                            Grade Assessment: II - A patient with mild systemic                            disease. After reviewing the risks and benefits,                            the patient was deemed in satisfactory condition to                            undergo the procedure.                           After obtaining informed consent, the endoscope was  passed under direct vision. Throughout the                            procedure, the patient's blood pressure, pulse, and                            oxygen saturations were monitored continuously. The                            GIF [id number] #[id number] was introduced through the                            mouth, and advanced to the second part of duodenum.                            The upper GI  endoscopy was accomplished without                            difficulty. The patient tolerated the procedure                            well. Scope In: Scope Out: Findings:                 Patchy, white plaques were found in the entire                            esophagus.                           Diffuse moderate inflammation characterized by                            erosions, erythema and granularity was found in the                            entire examined stomach. Biopsies were taken with a                            cold forceps for Helicobacter pylori testing.                           The examined duodenum was normal. Complications:            No immediate complications. Estimated Blood Loss:     Estimated blood loss was minimal. Impression:               - Esophageal plaques were found, consistent with                            candidiasis.                           - Gastritis. Biopsied.                           - Normal examined duodenum. Recommendation:           -  Patient has a contact number available for                            emergencies. The signs and symptoms of potential                            delayed complications were discussed with the                            patient. Return to normal activities tomorrow.                            Written discharge instructions were provided to the                            patient.                           - Resume previous diet.                           - Continue present medications.                           - Fluconazole 400 mg x 1 day, 200 mg x 13 days for                            Candida esophagitis.                           - Await pathology results.                           - Clinical symptoms consistent with gastroparesis,                            await biopsy results and if persistent symptoms 4                            hour GES is recommended. Gordy CHRISTELLA Starch, MD 10/02/2024 10:34:24  AM This report has been signed electronically.

## 2024-10-02 NOTE — Progress Notes (Signed)
 GASTROENTEROLOGY PROCEDURE H&P NOTE   Primary Care Physician: Lendia Boby CROME, NP-C    Reason for Procedure:  Pill dysphagia, episodic nausea and vomiting, dyspepsia  Plan:    EGD with possible dilation  Patient is appropriate for endoscopic procedure(s) in the ambulatory ([facility]) setting.  The nature of the procedure, as well as the risks, benefits, and alternatives were carefully and thoroughly reviewed with the patient. Ample time for discussion and questions allowed. The patient understood, was satisfied, and agreed to proceed.     HPI: [name] is a 43 y.o. male who presents for EGD.  Medical history as below.  Tolerated the prep.  No recent chest pain or shortness of breath.  No abdominal pain today.  Past Medical History:  Diagnosis Date   Alcohol abuse    Allergy    Anxiety    Anxiety and depression    Benzodiazepine dependence (HCC)    Depression    Difficulty urinating    cant urinate in public places (restrooms)   Diverticulitis    Fatty liver 04/11/2023   GERD (gastroesophageal reflux disease)    Hyperlipidemia    Hypertension    Opioid dependence (HCC)    Pilonidal cyst    Seasonal allergies    Skull fracture (HCC) 2004   Trauma   Substance abuse (HCC)    Tension headache    Tubular adenoma of colon     Past Surgical History:  Procedure Laterality Date   ORIF right 3rd finger  12/04/2010   Dr. Murrell - see hospital notes.   pilonidal cystectomy  08/01/2011   RADIOFREQUENCY ABLATION NERVES  12/10/2012   VASECTOMY  04/03/2010   WISDOM TOOTH EXTRACTION      Prior to Admission medications   Medication Sig Start Date End Date Taking? Authorizing Provider  clonazePAM  (KLONOPIN ) 1 MG tablet Take 1 mg by mouth QID.   Yes [provider]  Dextromethorphan-quiNIDine (NUEDEXTA) 20-10 MG capsule Take 1 capsule by mouth 2 (two) times daily.   Yes [provider]  gabapentin (NEURONTIN) 800 MG tablet Take 800 mg by mouth 4 (four) times  daily. 08/27/22  Yes [provider]  hydrOXYzine  (VISTARIL ) 25 MG capsule Take 75 mg by mouth at bedtime as needed for anxiety.    Yes [provider]  lamoTRIgine  (LAMICTAL ) 150 MG tablet Take 150 mg by mouth daily. 08/11/22  Yes [provider]  melatonin 3 MG TABS tablet Take 3 mg by mouth at bedtime.   Yes [provider]  omega-3 acid ethyl esters (LOVAZA ) 1 g capsule Take 2 capsules (2 g total) by mouth 2 (two) times daily. 02/01/24  Yes Henson, Vickie L, NP-C  propranolol (INDERAL) 20 MG tablet Take 20 mg by mouth 4 (four) times daily. 08/27/22  Yes [provider]  rosuvastatin  (CRESTOR ) 10 MG tablet Take 1 tablet (10 mg total) by mouth daily. 07/24/24  Yes Henson, Vickie L, NP-C  cyclobenzaprine  (FLEXERIL ) 5 MG tablet Take 1 tablet (5 mg total) by mouth 3 (three) times daily as needed for muscle spasms. 06/03/24   Smith, Zachary M, DO    Current Outpatient Medications  Medication Sig Dispense Refill   clonazePAM  (KLONOPIN ) 1 MG tablet Take 1 mg by mouth QID.     Dextromethorphan-quiNIDine (NUEDEXTA) 20-10 MG capsule Take 1 capsule by mouth 2 (two) times daily.     gabapentin (NEURONTIN) 800 MG tablet Take 800 mg by mouth 4 (four) times daily.     hydrOXYzine  (  VISTARIL ) 25 MG capsule Take 75 mg by mouth at bedtime as needed for anxiety.      lamoTRIgine  (LAMICTAL ) 150 MG tablet Take 150 mg by mouth daily.     melatonin 3 MG TABS tablet Take 3 mg by mouth at bedtime.     omega-3 acid ethyl esters (LOVAZA ) 1 g capsule Take 2 capsules (2 g total) by mouth 2 (two) times daily. 360 capsule 0   propranolol (INDERAL) 20 MG tablet Take 20 mg by mouth 4 (four) times daily.     rosuvastatin  (CRESTOR ) 10 MG tablet Take 1 tablet (10 mg total) by mouth daily. 90 tablet 1   cyclobenzaprine  (FLEXERIL ) 5 MG tablet Take 1 tablet (5 mg total) by mouth 3 (three) times daily as needed for muscle spasms. 270 tablet 3   Current Facility-Administered Medications   Medication Dose Route Frequency Provider Last Rate Last Admin   0.9 %  sodium chloride  infusion  500 mL Intravenous Once Danai Gotto, Gordy HERO, MD        Allergies as of 10/02/2024 - Review Complete 10/02/2024  Allergen Reaction Noted   Avelox [moxifloxacin hcl in nacl] Nausea And Vomiting 06/02/2011   Fluoxetine  Other (See Comments) 03/09/2011   Nsaids Other (See Comments) 12/04/2016   Other Nausea And Vomiting 09/07/2014   Moxifloxacin Nausea And Vomiting 09/29/2010    Family History  Problem Relation Age of Onset   Depression Mother    Anxiety disorder Mother    Alcohol abuse Mother    Hypertension Father    Hyperlipidemia Father    Colon polyps Paternal Grandfather    Cancer Paternal Aunt        breast   Breast cancer Paternal Aunt    Colon cancer Neg Hx    Esophageal cancer Neg Hx    Pancreatic cancer Neg Hx    Stomach cancer Neg Hx    Rectal cancer Neg Hx     Social History   Socioeconomic History   Marital status: Married    Spouse name: Not on file   Number of children: 0   Years of education: 12   Highest education level: 12th grade  Occupational History   Occupation: unemployed  Tobacco Use   Smoking status: Every Day    Types: E-cigarettes   Smokeless tobacco: Never   Tobacco comments:    quit 2 or more years, smokes marijuana  Vaping Use   Vaping status: Former   Substances: Nicotine  Substance and Sexual Activity   Alcohol use: No   Drug use: Yes    Types: Marijuana    Comment: yesterday 10/01/24   Sexual activity: Not Currently    Partners: Female    Birth control/protection: Surgical, None    Comment: vasectomy  Other Topics Concern   Not on file  Social History Narrative   HSG, attending [employer] (09/2010). Married June 2011.No children. NO history of physical or sexual abuse. Not working at this time.             Social Drivers of Corporate Investment Banker Strain: Low Risk  (08/28/2024)   Overall Financial Resource Strain (CARDIA)     Difficulty of Paying Living Expenses: Not hard at all  Food Insecurity: No Food Insecurity (08/28/2024)   Hunger Vital Sign    Worried About Running Out of Food in the Last Year: Never true    Ran Out of Food in the Last Year: Never true  Transportation Needs: No Transportation Needs (08/28/2024)  PRAPARE - Administrator, Civil Service (Medical): No    Lack of Transportation (Non-Medical): No  Physical Activity: Inactive (08/28/2024)   Exercise Vital Sign    Days of Exercise per Week: 0 days    Minutes of Exercise per Session: Not on file  Stress: Stress Concern Present (08/28/2024)   [employer] of Occupational Health - Occupational Stress Questionnaire    Feeling of Stress: Very much  Social Connections: Unknown (08/28/2024)   Social Connection and Isolation Panel    Frequency of Communication with Friends and Family: Patient declined    Frequency of Social Gatherings with Friends and Family: Patient declined    Attends Religious Services: Never    Database Administrator or Organizations: No    Attends Engineer, Structural: Not on file    Marital Status: Married  Catering Manager Violence: Not on file    Physical Exam: Vital signs in last 24 hours: @BP  128/82   Pulse 82   Temp 98.3 F (36.8 C)   Ht 6' (1.829 m)   Wt 190 lb (86.2 kg)   SpO2 98%   BMI 25.77 kg/m  GEN: NAD EYE: Sclerae anicteric ENT: MMM CV: Non-tachycardic Pulm: CTA b/l GI: Soft, NT/ND NEURO:  Alert & Oriented x 3   Gordy Starch, MD [facility]  10/02/2024 10:09 AM

## 2024-10-02 NOTE — Progress Notes (Signed)
 Called to room to assist during endoscopic procedure.  Patient ID and intended procedure confirmed with present staff. Received instructions for my participation in the procedure from the performing physician.

## 2024-10-03 ENCOUNTER — Telehealth: Payer: Self-pay

## 2024-10-03 DIAGNOSIS — R1310 Dysphagia, unspecified: Secondary | ICD-10-CM

## 2024-10-03 MED ORDER — FLUCONAZOLE 200 MG PO TABS: 200.0000 mg | ORAL_TABLET | Freq: Every day | ORAL | 0 refills | Status: DC

## 2024-10-03 NOTE — Telephone Encounter (Signed)
  Follow up Call-     10/02/2024    9:55 AM  Call back number  Post procedure Call Back phone  # [phone]  Permission to leave phone message Yes     Patient questions:  Do you have a fever, pain , or abdominal swelling? No. Pain Score  0 *  Have you tolerated food without any problems? Yes.    Have you been able to return to your normal activities? Yes.    Do you have any questions about your discharge instructions: Diet   No. Medications  No. Follow up visit  No.  Do you have questions or concerns about your Care? No.  Actions: * If pain score is 4 or above: No action needed, pain <4.

## 2024-10-03 NOTE — Telephone Encounter (Signed)
 Sent new prescription for Fluconazole with correct dosage/instructions. Called pharmacy to make them aware. Pharmacist states she had already filled this for the pt. No further assistance needs. She will disregard 2nd prescription that was sent.

## 2024-10-03 NOTE — Telephone Encounter (Signed)
 Fluconazole prescription was sent to patient's pharmacy incorrectly. The sig states one tablet by mouth once daily. The note to the pharmacy line states the correct sig description. Pharmacy needs to be notified of correct dose please. Thanks

## 2024-10-06 LAB — SURGICAL PATHOLOGY

## 2024-10-06 NOTE — Progress Notes (Unsigned)
 [name] [facility] [address] [place] [address] Phone: [phone] Subjective:   [name], am serving as a scribe for Dr. Arthea Rose.  I'm seeing this patient by the request  of:  Johnathan Boby CROME, NP-C  CC: Arm pain  YEP:Dlagzrupcz  06/03/2024 Patient is 1 more seems to be secondary to heel pain.  Discussed icing regimen and home exercises, discussed which activities to do and which ones to avoid.  Given a brace that I think will be beneficial.  Discussed avoiding being barefoot in the house.  If necessary will consider other medications but hopefully patient does well.  Follow-up with me again in 2 to 3 months     Seems stable overall.  Given Flexeril .  Refill for this at 5 mg 3 times a day with patient doing very well.  Patient is accompanied with wife and states that seems to be stable overall.  At the moment more of the ankle that is causing him to not be active than truly his back.  Follow-up again 3 to 6 months     Updated 10/07/2024 [name] is a 43 y.o. male coming in with complaint of back and heel pain. Heel pain has been doing okay. Walking more with no pain. The back is ok. About the same. Having pain in L underarm and armpit. This area in particular has flared before. Feels sharp pain with pressure.       Past Medical History:  Diagnosis Date   Alcohol abuse    Allergy    Anxiety    Anxiety and depression    Benzodiazepine dependence (HCC)    Depression    Difficulty urinating    cant urinate in public places (restrooms)   Diverticulitis    Fatty liver 04/11/2023   GERD (gastroesophageal reflux disease)    Hyperlipidemia    Hypertension    Opioid dependence (HCC)    Pilonidal cyst    Seasonal allergies    Skull fracture (HCC) 2004   Trauma   Substance abuse (HCC)    Tension headache    Tubular adenoma of colon    Past Surgical History:  Procedure Laterality Date   ORIF right 3rd finger  12/04/2010   Dr.  Murrell - see hospital notes.   pilonidal cystectomy  08/01/2011   RADIOFREQUENCY ABLATION NERVES  12/10/2012   VASECTOMY  04/03/2010   WISDOM TOOTH EXTRACTION     Social History   Socioeconomic History   Marital status: Married    Spouse name: Not on file   Number of children: 0   Years of education: 12   Highest education level: 12th grade  Occupational History   Occupation: unemployed  Tobacco Use   Smoking status: Every Day    Types: E-cigarettes   Smokeless tobacco: Never   Tobacco comments:    quit 2 or more years, smokes marijuana  Vaping Use   Vaping status: Former   Substances: Nicotine  Substance and Sexual Activity   Alcohol use: No   Drug use: Yes    Types: Marijuana    Comment: yesterday 10/01/24   Sexual activity: Not Currently    Partners: Female    Birth control/protection: Surgical, None    Comment: vasectomy  Other Topics Concern   Not on file  Social History Narrative   HSG, attending [employer] (09/2010). Married June 2011.No children. NO history of physical or sexual abuse. Not working at this time.  Social Drivers of Corporate Investment Banker Strain: Low Risk  (08/28/2024)   Overall Financial Resource Strain (CARDIA)    Difficulty of Paying Living Expenses: Not hard at all  Food Insecurity: No Food Insecurity (08/28/2024)   Hunger Vital Sign    Worried About Running Out of Food in the Last Year: Never true    Ran Out of Food in the Last Year: Never true  Transportation Needs: No Transportation Needs (08/28/2024)   PRAPARE - Administrator, Civil Service (Medical): No    Lack of Transportation (Non-Medical): No  Physical Activity: Inactive (08/28/2024)   Exercise Vital Sign    Days of Exercise per Week: 0 days    Minutes of Exercise per Session: Not on file  Stress: Stress Concern Present (08/28/2024)   [employer] of Occupational Health - Occupational Stress Questionnaire    Feeling of Stress: Very much  Social  Connections: Unknown (08/28/2024)   Social Connection and Isolation Panel    Frequency of Communication with Friends and Family: Patient declined    Frequency of Social Gatherings with Friends and Family: Patient declined    Attends Religious Services: Never    Database Administrator or Organizations: No    Attends Engineer, Structural: Not on file    Marital Status: Married   Allergies  Allergen Reactions   Avelox [Moxifloxacin Hcl In Nacl] Nausea And Vomiting   Fluoxetine  Other (See Comments)    Intolerant of all SSRI's, NE drugs, makes paranoid   Nsaids Other (See Comments)    N/V   Other Nausea And Vomiting    Strong antibiotics by mouth / antidepressants Getting worse   Moxifloxacin Nausea And Vomiting   Family History  Problem Relation Age of Onset   Depression Mother    Anxiety disorder Mother    Alcohol abuse Mother    Hypertension Father    Hyperlipidemia Father    Colon polyps Paternal Grandfather    Cancer Paternal Aunt        breast   Breast cancer Paternal Aunt    Colon cancer Neg Hx    Esophageal cancer Neg Hx    Pancreatic cancer Neg Hx    Stomach cancer Neg Hx    Rectal cancer Neg Hx      Current Outpatient Medications (Cardiovascular):    omega-3 acid ethyl esters (LOVAZA ) 1 g capsule, Take 2 capsules (2 g total) by mouth 2 (two) times daily.   propranolol (INDERAL) 20 MG tablet, Take 20 mg by mouth 4 (four) times daily.   rosuvastatin  (CRESTOR ) 10 MG tablet, Take 1 tablet (10 mg total) by mouth daily.     Current Outpatient Medications (Other):    clonazePAM  (KLONOPIN ) 1 MG tablet, Take 1 mg by mouth QID.   cyclobenzaprine  (FLEXERIL ) 5 MG tablet, Take 1 tablet (5 mg total) by mouth 3 (three) times daily as needed for muscle spasms.   Dextromethorphan-quiNIDine (NUEDEXTA) 20-10 MG capsule, Take 1 capsule by mouth 2 (two) times daily.   gabapentin (NEURONTIN) 800 MG tablet, Take 800 mg by mouth 4 (four) times daily.   hydrOXYzine  (VISTARIL )  25 MG capsule, Take 75 mg by mouth at bedtime as needed for anxiety.    lamoTRIgine  (LAMICTAL ) 150 MG tablet, Take 150 mg by mouth daily.   melatonin 3 MG TABS tablet, Take 3 mg by mouth at bedtime.   Reviewed prior external information including notes and imaging from  primary care provider As well as notes  that were available from care everywhere and other healthcare systems.  Past medical history, social, surgical and family history all reviewed in electronic medical record.  No pertanent information unless stated regarding to the chief complaint.   Review of Systems:  No headache, visual changes, nausea, vomiting, diarrhea, constipation, dizziness, abdominal pain, skin rash, fevers, chills, night sweats, weight loss, swollen lymph nodes, body aches, joint swelling, chest pain, shortness of breath, mood changes. POSITIVE muscle aches  Objective  Blood pressure (!) 120/90, pulse 85, height 6' (1.829 m), weight 194 lb (88 kg), SpO2 95%.   General: No apparent distress alert and oriented x3 mood and affect normal, dressed appropriately.  HEENT: Pupils equal, extraocular movements intact  Respiratory: Patient's speak in full sentences and does not appear short of breath  Cardiovascular: No lower extremity edema, non tender, no erythema  Left shoulder has some relatively good range of motion.  Patient seems to do well with rotator cuff.  Patient does have more discomfort when affecting the pectoralis minor aspect.  Neurovascular intact distally with 5 out of 5 strength.  [id number]; 15 additional minutes spent for Therapeutic exercises as stated in above notes.  This included exercises focusing on stretching, strengthening, with significant focus on eccentric aspects.   Long term goals include an improvement in range of motion, strength, endurance as well as avoiding reinjury. Patient's frequency would include in 1-2 times a day, 3-5 times a week for a duration of 6-12 weeks.  Exercises that  included:  Basic scapular stabilization to include adduction and depression of scapula Scaption, focusing on proper movement and good control Internal and External rotation utilizing a theraband, with elbow tucked at side entire time Rows with theraband  Proper technique shown and discussed handout in great detail with ATC.  All questions were discussed and answered.     Impression and Recommendations:    The above documentation has been reviewed and is accurate and complete [place]ford Staheli M Othman Masur, DO

## 2024-10-07 ENCOUNTER — Ambulatory Visit

## 2024-10-07 ENCOUNTER — Ambulatory Visit: Admitting: Family Medicine

## 2024-10-07 ENCOUNTER — Encounter: Payer: Self-pay | Admitting: Family Medicine

## 2024-10-07 VITALS — BP 120/90 | HR 85 | Ht 72.0 in | Wt 194.0 lb

## 2024-10-07 DIAGNOSIS — G2589 Other specified extrapyramidal and movement disorders: Secondary | ICD-10-CM | POA: Diagnosis not present

## 2024-10-07 DIAGNOSIS — M549 Dorsalgia, unspecified: Secondary | ICD-10-CM

## 2024-10-07 DIAGNOSIS — R0789 Other chest pain: Secondary | ICD-10-CM

## 2024-10-07 DIAGNOSIS — D72820 Lymphocytosis (symptomatic): Secondary | ICD-10-CM

## 2024-10-07 LAB — COMPREHENSIVE METABOLIC PANEL WITH GFR
ALT: 19 U/L (ref 0–53)
AST: 18 U/L (ref 0–37)
Albumin: 4.5 g/dL (ref 3.5–5.2)
Alkaline Phosphatase: 69 U/L (ref 39–117)
BUN: 11 mg/dL (ref 6–23)
CO2: 27 meq/L (ref 19–32)
Calcium: 9.1 mg/dL (ref 8.4–10.5)
Chloride: 97 meq/L (ref 96–112)
Creatinine, Ser: 0.75 mg/dL (ref 0.40–1.50)
GFR: 112.33 mL/min (ref >60.00)
Glucose, Bld: 80 mg/dL (ref 70–99)
Potassium: 4.2 meq/L (ref 3.5–5.1)
Sodium: 131 meq/L — ABNORMAL LOW (ref 135–145)
Total Bilirubin: 0.4 mg/dL (ref 0.2–1.2)
Total Protein: 7.4 g/dL (ref 6.0–8.3)

## 2024-10-07 LAB — CBC WITH DIFFERENTIAL/PLATELET
Basophils Absolute: 0.1 K/uL (ref 0.0–0.1)
Basophils Relative: 0.7 % (ref 0.0–3.0)
Eosinophils Absolute: 0.4 K/uL (ref 0.0–0.7)
Eosinophils Relative: 3.5 % (ref 0.0–5.0)
HCT: 40.3 % (ref 39.0–52.0)
Hemoglobin: 13.7 g/dL (ref 13.0–17.0)
Lymphocytes Relative: 47.3 % — ABNORMAL HIGH (ref 12.0–46.0)
Lymphs Abs: 4.8 K/uL — ABNORMAL HIGH (ref 0.7–4.0)
MCHC: 33.9 g/dL (ref 30.0–36.0)
MCV: 89.2 fl (ref 78.0–100.0)
Monocytes Absolute: 1.1 K/uL — ABNORMAL HIGH (ref 0.1–1.0)
Monocytes Relative: 10.9 % (ref 3.0–12.0)
Neutro Abs: 3.8 K/uL (ref 1.4–7.7)
Neutrophils Relative %: 37.6 % — ABNORMAL LOW (ref 43.0–77.0)
Platelets: 328 K/uL (ref 150.0–400.0)
RBC: 4.52 Mil/uL (ref 4.22–5.81)
RDW: 13.9 % (ref 11.5–15.5)
WBC: 10.2 K/uL (ref 4.0–10.5)

## 2024-10-07 LAB — URIC ACID: Uric Acid, Serum: 3.1 mg/dL — ABNORMAL LOW (ref 4.0–7.8)

## 2024-10-07 LAB — SEDIMENTATION RATE: Sed Rate: 29 mm/h — ABNORMAL HIGH (ref 0–15)

## 2024-10-07 NOTE — Patient Instructions (Addendum)
 Xrays today Labs today Do prescribed exercises at least 3x a week See you again in 2 months

## 2024-10-07 NOTE — Assessment & Plan Note (Signed)
 Has had it previously but significantly elevation in the last lab draw.  Will recheck.

## 2024-10-07 NOTE — Assessment & Plan Note (Signed)
 Patient has had a history of scapular dyskinesis previously but now having more discomfort noted.  Discussed with patient about icing regimen and home exercises, discussed which activities to do and which ones to avoid.  Seems to be more pectoralis minor.  Do feel we do need to recheck patient's white blood cell count with as well.  Discussed which activities to do and which ones to avoid.  Increase activity slowly.  Follow-up again in 2 months otherwise.

## 2024-10-08 ENCOUNTER — Ambulatory Visit: Payer: Self-pay | Admitting: Internal Medicine

## 2024-10-08 LAB — VITAMIN B12: Vitamin B-12: 297 pg/mL (ref 211–911)

## 2024-10-08 LAB — VITAMIN D 25 HYDROXY (VIT D DEFICIENCY, FRACTURES): VITD: 26.85 ng/mL — ABNORMAL LOW (ref 30.00–100.00)

## 2024-10-09 ENCOUNTER — Ambulatory Visit: Payer: Self-pay | Admitting: Family Medicine

## 2024-10-09 ENCOUNTER — Ambulatory Visit: Admitting: Family Medicine

## 2024-10-13 ENCOUNTER — Other Ambulatory Visit: Payer: Self-pay

## 2024-10-13 MED ORDER — VITAMIN D (ERGOCALCIFEROL) 1.25 MG (50000 UNIT) PO CAPS: 50000.0000 [IU] | ORAL_CAPSULE | ORAL | 0 refills | Status: AC

## 2024-10-14 ENCOUNTER — Other Ambulatory Visit: Payer: Self-pay | Admitting: Family Medicine

## 2024-10-14 ENCOUNTER — Encounter: Payer: Self-pay | Admitting: Family Medicine

## 2024-10-14 MED ORDER — VALACYCLOVIR HCL 1 G PO TABS: ORAL_TABLET | ORAL | 0 refills | Status: AC

## 2024-12-10 NOTE — Progress Notes (Signed)
 " [name] [address] [place] [address] Phone: [phone] Subjective:   [name], am serving as a scribe for Dr. Arthea Rose.  I'm seeing this patient by the request  of:  Lendia, Vickie L, NP-C  CC: back pain follow up    YEP:Dlagzrupcz  10/07/2024 Has had it previously but significantly elevation in the last lab draw.  Will recheck.     Patient has had a history of scapular dyskinesis previously but now having more discomfort noted.  Discussed with patient about icing regimen and home exercises, discussed which activities to do and which ones to avoid.  Seems to be more pectoralis minor.  Do feel we do need to recheck patient's white blood cell count with as well.  Discussed which activities to do and which ones to avoid.  Increase activity slowly.  Follow-up again in 2 months otherwise.     Update 12/12/2024 [name] is a 44 y.o. male coming in with complaint of cervical and thoracic spine pain. Patient states has been using the brace and thinks its working. Arch in both feet ar hurting. R is starting to have sharp heel pain. Neck and back are about the same. No other new symptoms.  Xray cervical 10/07/2024  IMPRESSION: 1. No acute abnormality of the cervical spine. 2. Mild degenerative changes at C4-C5 with subtle posterior disc space narrowing. 3. Mild degenerative spurring at the anterior superior endplate of C6.  Xray thoracic 10/07/2024 IMPRESSION: 1. No acute abnormality of the thoracic spine.   Found to have low vitamin D .  Sed rate was minorly elevated as well.  B12 is on the lower side.  Past Medical History:  Diagnosis Date   Alcohol abuse    Allergy    Anxiety    Anxiety and depression    Benzodiazepine dependence (HCC)    Depression    Difficulty urinating    cant urinate in public places (restrooms)   Diverticulitis    Fatty liver 04/11/2023   GERD (gastroesophageal reflux disease)    Hyperlipidemia     Hypertension    Opioid dependence (HCC)    Pilonidal cyst    Seasonal allergies    Skull fracture (HCC) 2004   Trauma   Substance abuse (HCC)    Tension headache    Tubular adenoma of colon    Past Surgical History:  Procedure Laterality Date   ORIF right 3rd finger  12/04/2010   Dr. Murrell - see hospital notes.   pilonidal cystectomy  08/01/2011   RADIOFREQUENCY ABLATION NERVES  12/10/2012   VASECTOMY  04/03/2010   WISDOM TOOTH EXTRACTION     Social History   Socioeconomic History   Marital status: Married    Spouse name: Not on file   Number of children: 0   Years of education: 12   Highest education level: 12th grade  Occupational History   Occupation: unemployed  Tobacco Use   Smoking status: Every Day    Types: E-cigarettes   Smokeless tobacco: Never   Tobacco comments:    quit 2 or more years, smokes marijuana  Vaping Use   Vaping status: Former   Substances: Nicotine  Substance and Sexual Activity   Alcohol use: No   Drug use: Yes    Types: Marijuana    Comment: yesterday 10/01/24   Sexual activity: Not Currently    Partners: Female    Birth control/protection: Surgical, None    Comment: vasectomy  Other  Topics Concern   Not on file  Social History Narrative   HSG, attending [employer] (09/2010). Married June 2011.No children. NO history of physical or sexual abuse. Not working at this time.             Social Drivers of Health   Tobacco Use: High Risk (10/07/2024)   Patient History    Smoking Tobacco Use: Every Day    Smokeless Tobacco Use: Never    Passive Exposure: Not on file  Financial Resource Strain: Low Risk (08/28/2024)   Overall Financial Resource Strain (CARDIA)    Difficulty of Paying Living Expenses: Not hard at all  Food Insecurity: No Food Insecurity (08/28/2024)   Epic    Worried About Radiation Protection Practitioner of Food in the Last Year: Never true    Ran Out of Food in the Last Year: Never true  Transportation Needs: No Transportation Needs  (08/28/2024)   Epic    Lack of Transportation (Medical): No    Lack of Transportation (Non-Medical): No  Physical Activity: Inactive (08/28/2024)   Exercise Vital Sign    Days of Exercise per Week: 0 days    Minutes of Exercise per Session: Not on file  Stress: Stress Concern Present (08/28/2024)   [employer] of Occupational Health - Occupational Stress Questionnaire    Feeling of Stress: Very much  Social Connections: Unknown (08/28/2024)   Social Connection and Isolation Panel    Frequency of Communication with Friends and Family: Patient declined    Frequency of Social Gatherings with Friends and Family: Patient declined    Attends Religious Services: Never    Database Administrator or Organizations: No    Attends Engineer, Structural: Not on file    Marital Status: Married  Depression (PHQ2-9): High Risk (08/28/2024)   Depression (PHQ2-9)    PHQ-2 Score: 20  Alcohol Screen: Not on file  Housing: Low Risk (08/28/2024)   Epic    Unable to Pay for Housing in the Last Year: No    Number of Times Moved in the Last Year: 0    Homeless in the Last Year: No  Utilities: Not on file  Health Literacy: Not on file   Allergies[1] Family History  Problem Relation Age of Onset   Depression Mother    Anxiety disorder Mother    Alcohol abuse Mother    Hypertension Father    Hyperlipidemia Father    Colon polyps Paternal Grandfather    Cancer Paternal Aunt        breast   Breast cancer Paternal Aunt    Colon cancer Neg Hx    Esophageal cancer Neg Hx    Pancreatic cancer Neg Hx    Stomach cancer Neg Hx    Rectal cancer Neg Hx     Current Outpatient Medications (Cardiovascular):    omega-3 acid ethyl esters (LOVAZA ) 1 g capsule, Take 2 capsules (2 g total) by mouth 2 (two) times daily.   propranolol (INDERAL) 20 MG tablet, Take 20 mg by mouth 4 (four) times daily.   rosuvastatin  (CRESTOR ) 10 MG tablet, Take 1 tablet (10 mg total) by mouth daily.  Current Outpatient  Medications (Other):    clonazePAM  (KLONOPIN ) 1 MG tablet, Take 1 mg by mouth QID.   cyclobenzaprine  (FLEXERIL ) 5 MG tablet, Take 1 tablet (5 mg total) by mouth 3 (three) times daily as needed for muscle spasms.   Dextromethorphan-quiNIDine (NUEDEXTA) 20-10 MG capsule, Take 1 capsule by mouth 2 (two) times daily.  gabapentin (NEURONTIN) 800 MG tablet, Take 800 mg by mouth 4 (four) times daily.   hydrOXYzine  (VISTARIL ) 25 MG capsule, Take 75 mg by mouth at bedtime as needed for anxiety.    lamoTRIgine  (LAMICTAL ) 150 MG tablet, Take 150 mg by mouth daily.   melatonin 3 MG TABS tablet, Take 3 mg by mouth at bedtime.   valACYclovir  (VALTREX ) 1000 MG tablet, Take 2,000 mg PO every 12 hours x 1 day ASAP after onset of symptoms.   Vitamin D , Ergocalciferol , (DRISDOL ) 1.25 MG (50000 UNIT) CAPS capsule, Take 1 capsule (50,000 Units total) by mouth every 7 (seven) days.   Reviewed prior external information including notes and imaging from  primary care provider As well as notes that were available from care everywhere and other healthcare systems.  Past medical history, social, surgical and family history all reviewed in electronic medical record.  No pertanent information unless stated regarding to the chief complaint.   Review of Systems:  No headache, visual changes, nausea, vomiting, diarrhea, constipation, dizziness, abdominal pain, skin rash, fevers, chills, night sweats, weight loss, swollen lymph nodes, body aches, joint swelling, chest pain, shortness of breath, mood changes. POSITIVE muscle aches  Objective  Blood pressure (!) 130/90, pulse 93, height 6' (1.829 m), weight 201 lb (91.2 kg), SpO2 96%.   General: No apparent distress alert and oriented x3 mood and affect normal, dressed appropriately.  HEENT: Pupils equal, extraocular movements intact  Respiratory: Patient's speak in full sentences and does not appear short of breath  Cardiovascular: No lower extremity edema, non tender, no  erythema  But exam shows that there is breakdown of the longitudinal arch noted with mild overpronation of the hindfoot left greater than right.  Patient still moderately tender but nothing severe.  Right side is starting to have a very similar presentation with some discomfort over the Achilles minorly.    Impression and Recommendations:    The above documentation has been reviewed and is accurate and complete Arthea CHRISTELLA Sharps, DO        [1]  Allergies Allergen Reactions   Avelox [Moxifloxacin Hcl In Nacl] Nausea And Vomiting   Fluoxetine  Other (See Comments)    Intolerant of all SSRI's, NE drugs, makes paranoid   Nsaids Other (See Comments)    N/V   Other Nausea And Vomiting    Strong antibiotics by mouth / antidepressants Getting worse   Moxifloxacin Nausea And Vomiting   "

## 2024-12-12 ENCOUNTER — Ambulatory Visit: Admitting: Family Medicine

## 2024-12-12 VITALS — BP 130/90 | HR 93 | Ht 72.0 in | Wt 201.0 lb

## 2024-12-12 DIAGNOSIS — E538 Deficiency of other specified B group vitamins: Secondary | ICD-10-CM | POA: Insufficient documentation

## 2024-12-12 DIAGNOSIS — M79671 Pain in right foot: Secondary | ICD-10-CM | POA: Diagnosis not present

## 2024-12-12 DIAGNOSIS — M545 Low back pain, unspecified: Secondary | ICD-10-CM | POA: Diagnosis not present

## 2024-12-12 DIAGNOSIS — M79672 Pain in left foot: Secondary | ICD-10-CM | POA: Diagnosis not present

## 2024-12-12 MED ORDER — CYANOCOBALAMIN 1000 MCG/ML IJ SOLN
1000.0000 ug | Freq: Once | INTRAMUSCULAR | Status: AC
  Administered 2024-12-12: 1000 ug via INTRAMUSCULAR

## 2024-12-12 NOTE — Assessment & Plan Note (Signed)
 Injection given

## 2024-12-12 NOTE — Assessment & Plan Note (Signed)
 Inflammatory heel pain with also what appears to be the breakdown of the longitudinal arch.  Will send patient for custom orthotics with him making some improvement.  Discussed continuing home exercises, icing regimen.  Follow-up with me again in 6 to 12 weeks

## 2024-12-12 NOTE — Patient Instructions (Addendum)
 B12 injections Sports [facility] referral Keep doing exercises See you again in 2 months

## 2024-12-12 NOTE — Assessment & Plan Note (Signed)
 Has had back pain previously.  Discussed the possibility of restarting osteopathic manipulation.  Has a scapular dyskinesis and is going to start working on the exercises on a more regular basis.  Follow-up again in 2 to 3 months

## 2024-12-25 ENCOUNTER — Ambulatory Visit

## 2024-12-25 VITALS — BP 126/86 | Ht 72.0 in | Wt 195.0 lb

## 2024-12-25 DIAGNOSIS — M79671 Pain in right foot: Secondary | ICD-10-CM | POA: Diagnosis not present

## 2024-12-25 DIAGNOSIS — M79672 Pain in left foot: Secondary | ICD-10-CM | POA: Diagnosis not present

## 2024-12-25 DIAGNOSIS — M7662 Achilles tendinitis, left leg: Secondary | ICD-10-CM

## 2024-12-25 NOTE — Progress Notes (Addendum)
 [facility] A Department of [facility]   PCP: Lendia Boby CROME, NP-C  CHIEF COMPLAINT: referral for custom orthotics   HPI: Patient is a pleasant 44 y.o. male who presents today for fitting of custom orthotics.  Patient was referred by Dr. Arthea Sharps.  Patient has been having bilateral foot and Achilles pain.  Chart review from his last visit with Dr. Sharps mentions longitudinal arch breakdown, leg length discrepancy, and inflammatory heel pain.  On my assessment today patient complaining of bilateral insertional Achilles pain as well as generalized foot pain along his medial longitudinal arch.  Would like to get fitted for custom orthotics.  PMH:  Past Medical History:  Diagnosis Date   Alcohol abuse    Allergy    Anxiety    Anxiety and depression    Benzodiazepine dependence (HCC)    Depression    Difficulty urinating    cant urinate in public places (restrooms)   Diverticulitis    Fatty liver 04/11/2023   GERD (gastroesophageal reflux disease)    Hyperlipidemia    Hypertension    Opioid dependence (HCC)    Pilonidal cyst    Seasonal allergies    Skull fracture (HCC) 2004   Trauma   Substance abuse (HCC)    Tension headache    Tubular adenoma of colon     Patient Active Problem List   Diagnosis Date Noted   B12 deficiency 12/12/2024   Inflammatory heel pain, left 06/03/2024   Changing pigmented skin lesion 01/22/2024   Fatty liver 04/11/2023   Other hyperlipidemia 09/02/2020   Low testosterone 09/02/2020   Leukocytosis 09/02/2020   Hyperglycemia 09/02/2020   AC joint pain 10/10/2019   Labral tear of shoulder, right, initial encounter 09/05/2019   Bursitis of right shoulder 08/14/2019   Scapular dyskinesis 10/09/2018   Rib pain on left side 09/13/2016   Plica syndrome of left knee 07/25/2016   Spondylolisthesis at L5-S1 level 08/04/2014   Opioid dependence (HCC) 06/23/2014   Benzodiazepine dependence (HCC)  06/22/2014   Depression 06/22/2014   Anxiety 06/22/2014   Substance induced mood disorder (HCC) 06/22/2014   Pilonidal cyst 01/23/2011   PARESTHESIA 12/22/2010   Acute lumbar back pain 09/30/2010   HEADACHE, TENSION 09/29/2010   ALCOHOL ABUSE, HX OF 09/29/2010    PSurg:  Past Surgical History:  Procedure Laterality Date   ORIF right 3rd finger  12/04/2010   Dr. Murrell - see hospital notes.   pilonidal cystectomy  08/01/2011   RADIOFREQUENCY ABLATION NERVES  12/10/2012   VASECTOMY  04/03/2010   WISDOM TOOTH EXTRACTION      Allergies: Avelox [moxifloxacin hcl in nacl], Fluoxetine , Nsaids, Other, and Moxifloxacin  Meds:  Previous Medications   CLONAZEPAM  (KLONOPIN ) 1 MG TABLET    Take 1 mg by mouth QID.   CYCLOBENZAPRINE  (FLEXERIL ) 5 MG TABLET    Take 1 tablet (5 mg total) by mouth 3 (three) times daily as needed for muscle spasms.   DEXTROMETHORPHAN-QUINIDINE (NUEDEXTA) 20-10 MG CAPSULE    Take 1 capsule by mouth 2 (two) times daily.   GABAPENTIN (NEURONTIN) 800 MG TABLET    Take 800 mg by mouth 4 (four) times daily.   HYDROXYZINE  (VISTARIL ) 25 MG CAPSULE    Take 75 mg by mouth at bedtime as needed for anxiety.    LAMOTRIGINE  (LAMICTAL ) 150 MG TABLET    Take 150 mg by mouth daily.   MELATONIN 3 MG TABS TABLET    Take 3  mg by mouth at bedtime.   OMEGA-3 ACID ETHYL ESTERS (LOVAZA ) 1 G CAPSULE    Take 2 capsules (2 g total) by mouth 2 (two) times daily.   PROPRANOLOL (INDERAL) 20 MG TABLET    Take 20 mg by mouth 4 (four) times daily.   ROSUVASTATIN  (CRESTOR ) 10 MG TABLET    Take 1 tablet (10 mg total) by mouth daily.   VALACYCLOVIR  (VALTREX ) 1000 MG TABLET    Take 2,000 mg PO every 12 hours x 1 day ASAP after onset of symptoms.   VITAMIN D , ERGOCALCIFEROL , (DRISDOL ) 1.25 MG (50000 UNIT) CAPS CAPSULE    Take 1 capsule (50,000 Units total) by mouth every 7 (seven) days.    Social:  Social History   Tobacco Use   Smoking status: Every Day    Types: E-cigarettes   Smokeless  tobacco: Never   Tobacco comments:    quit 2 or more years, smokes marijuana  Substance Use Topics   Alcohol use: No    REVIEW OF SYSTEMS:  ROS negative except as noted in HPI above   Objective Exam:  Vitals:   12/25/24 0837  BP: 126/86  Weight: 195 lb (88.5 kg)  Height: 6' (1.829 m)    GENERAL: Patient is afebrile, Vital signs reviewed, well appearing, Patient appears comfortable, Alert and lucid. No apparent distress.   Physical Exam   Ortho Exam:  On examination of patient's bilateral feet patient has pes cavus feet bilaterally, right more noticeable than left.  Prominent calcaneus bilaterally with concern for Haglund deformity.  Patient has mild valgus angulation of great big toes bilaterally.  Mild calcaneal valgus of right foot.  Evidence of prior bilateral great toe ingrown toenail surgery with removal of half of nailbed.  On ambulation assessment patient walks with external rotation and has overpronation bilaterally.  Too many toes sign.  RESULTS:  Labs: No results found for this or any previous visit (from the past 48 hours).  Imaging:  No orders to display    Assessment/Plan:  1. Bilateral foot pain   2. Pain of left heel   3. Achilles tendinitis, left leg   4. Inflammatory heel pain, left    Patient was fitted for a : Fit 'n Run semi-rigid orthotic  The orthotic was heated, placed on the orthotic stand. The patient was positioned in subtalar neutral position and 10 degrees of ankle dorsiflexion in a weight bearing stance on the heated orthotic blank After completion of molding Blank: Fit 'n Run - size 11 Posting:  none Base:  none -After fitting of custom orthotics new orthotics were placed in patient's walking shoes of choice.  Patient endorsed increased comfort with standing and on ambulation.  On repeat ambulation assessment patient demonstrates more neutral alignment of subtalar joint and improved walking mechanics.  Evidence of decreased over  pronotation.  -Patient will begin to incorporate wearing custom orthotics into daily shoes as much as possible with goal of wearing them close to 100% the time while walking or exercising. - Informed patient that she is welcome to return for orthotic adjustments if she notices any issues with fit or comfort in upcoming weeks/months.  Repeat visit for orthotic adjustment will be free of charge - Patient understands and agrees to treatment plan.  No further questions or concerns at this time.   New Prescriptions   No medications on file    Medications, medical history, allergies, surgical history, hospitalizations, family history, social history, ROS and vitals entered by nursing staff  and reviewed by myself.  I discussed with the patient the diagnosis, treatment plan, indications for return to the emergency department, and for expected follow-up. The patient verbalized an understanding. The patient is asked if there are any questions or concerns. We discuss the case, until all issues are addressed to the patient's satisfaction.  Follow up per instructions including returning for additional office visit if symptoms worsen or proceeding to the emergency department or urgent care in the next 12-24hrs if there is an acute concerning increasing symptoms, pain, fevers, or other symptoms.  Prentice Agent, DO  9:48 AM, 12/25/2024

## 2025-02-09 ENCOUNTER — Ambulatory Visit: Admitting: Family Medicine
